# Patient Record
Sex: Female | Born: 1955
Health system: Southern US, Community
[De-identification: ages and names within clinical notes are randomized; demographics above are authoritative.]

## PROBLEM LIST (undated history)

## (undated) DIAGNOSIS — K219 Gastro-esophageal reflux disease without esophagitis: Secondary | ICD-10-CM

## (undated) DIAGNOSIS — K635 Polyp of colon: Secondary | ICD-10-CM

## (undated) DIAGNOSIS — K449 Diaphragmatic hernia without obstruction or gangrene: Secondary | ICD-10-CM

## (undated) DIAGNOSIS — G35 Multiple sclerosis: Secondary | ICD-10-CM

## (undated) DIAGNOSIS — R569 Unspecified convulsions: Secondary | ICD-10-CM

## (undated) DIAGNOSIS — I10 Essential (primary) hypertension: Secondary | ICD-10-CM

## (undated) DIAGNOSIS — M1990 Unspecified osteoarthritis, unspecified site: Secondary | ICD-10-CM

## (undated) HISTORY — PX: COLONOSCOPY: SHX174

## (undated) HISTORY — PX: DILATION AND CURETTAGE OF UTERUS: SHX78

## (undated) HISTORY — DX: Multiple sclerosis: G35

---

## 2000-01-22 ENCOUNTER — Encounter: Admission: RE | Admit: 2000-01-22 | Discharge: 2000-01-22 | Payer: Self-pay | Admitting: Family Medicine

## 2000-01-22 ENCOUNTER — Encounter: Payer: Self-pay | Admitting: Family Medicine

## 2001-02-05 ENCOUNTER — Encounter: Payer: Self-pay | Admitting: Obstetrics and Gynecology

## 2001-02-05 ENCOUNTER — Ambulatory Visit (HOSPITAL_COMMUNITY): Admission: RE | Admit: 2001-02-05 | Discharge: 2001-02-05 | Payer: Self-pay | Admitting: Obstetrics and Gynecology

## 2001-02-10 ENCOUNTER — Encounter: Payer: Self-pay | Admitting: Obstetrics and Gynecology

## 2001-02-10 ENCOUNTER — Encounter: Admission: RE | Admit: 2001-02-10 | Discharge: 2001-02-10 | Payer: Self-pay | Admitting: Obstetrics and Gynecology

## 2001-03-17 ENCOUNTER — Ambulatory Visit (HOSPITAL_COMMUNITY): Admission: RE | Admit: 2001-03-17 | Discharge: 2001-03-17 | Payer: Self-pay | Admitting: Family Medicine

## 2001-03-17 ENCOUNTER — Encounter: Payer: Self-pay | Admitting: Family Medicine

## 2002-02-15 ENCOUNTER — Encounter: Admission: RE | Admit: 2002-02-15 | Discharge: 2002-02-15 | Payer: Self-pay | Admitting: Obstetrics and Gynecology

## 2002-02-15 ENCOUNTER — Encounter: Payer: Self-pay | Admitting: Obstetrics and Gynecology

## 2003-02-15 ENCOUNTER — Encounter: Payer: Self-pay | Admitting: Family Medicine

## 2003-02-15 ENCOUNTER — Ambulatory Visit (HOSPITAL_COMMUNITY): Admission: RE | Admit: 2003-02-15 | Discharge: 2003-02-15 | Payer: Self-pay | Admitting: Family Medicine

## 2003-02-24 ENCOUNTER — Encounter: Admission: RE | Admit: 2003-02-24 | Discharge: 2003-02-24 | Payer: Self-pay | Admitting: Obstetrics and Gynecology

## 2003-02-24 ENCOUNTER — Encounter: Payer: Self-pay | Admitting: Obstetrics and Gynecology

## 2004-02-29 ENCOUNTER — Encounter: Admission: RE | Admit: 2004-02-29 | Discharge: 2004-02-29 | Payer: Self-pay | Admitting: Family Medicine

## 2004-03-07 ENCOUNTER — Ambulatory Visit (HOSPITAL_COMMUNITY): Admission: RE | Admit: 2004-03-07 | Discharge: 2004-03-07 | Payer: Self-pay | Admitting: Family Medicine

## 2005-01-04 ENCOUNTER — Emergency Department (HOSPITAL_COMMUNITY): Admission: EM | Admit: 2005-01-04 | Discharge: 2005-01-04 | Payer: Self-pay | Admitting: Emergency Medicine

## 2005-01-04 ENCOUNTER — Ambulatory Visit: Payer: Self-pay | Admitting: Internal Medicine

## 2005-01-04 ENCOUNTER — Inpatient Hospital Stay (HOSPITAL_COMMUNITY): Admission: EM | Admit: 2005-01-04 | Discharge: 2005-01-10 | Payer: Self-pay | Admitting: Emergency Medicine

## 2005-01-04 DIAGNOSIS — R569 Unspecified convulsions: Secondary | ICD-10-CM

## 2005-01-04 HISTORY — DX: Unspecified convulsions: R56.9

## 2005-04-29 ENCOUNTER — Ambulatory Visit (HOSPITAL_COMMUNITY): Admission: RE | Admit: 2005-04-29 | Discharge: 2005-04-29 | Payer: Self-pay | Admitting: Family Medicine

## 2005-05-19 ENCOUNTER — Ambulatory Visit (HOSPITAL_COMMUNITY): Admission: RE | Admit: 2005-05-19 | Discharge: 2005-05-19 | Payer: Self-pay | Admitting: Family Medicine

## 2005-07-08 ENCOUNTER — Ambulatory Visit (HOSPITAL_COMMUNITY): Admission: RE | Admit: 2005-07-08 | Discharge: 2005-07-08 | Payer: Self-pay | Admitting: Obstetrics & Gynecology

## 2005-07-28 ENCOUNTER — Ambulatory Visit (HOSPITAL_COMMUNITY): Admission: RE | Admit: 2005-07-28 | Discharge: 2005-07-28 | Payer: Self-pay | Admitting: Family Medicine

## 2005-10-07 ENCOUNTER — Encounter (HOSPITAL_COMMUNITY): Admission: RE | Admit: 2005-10-07 | Discharge: 2005-11-06 | Payer: Self-pay | Admitting: Family Medicine

## 2006-04-30 ENCOUNTER — Ambulatory Visit (HOSPITAL_COMMUNITY): Admission: RE | Admit: 2006-04-30 | Discharge: 2006-04-30 | Payer: Self-pay | Admitting: Family Medicine

## 2006-05-12 ENCOUNTER — Encounter: Admission: RE | Admit: 2006-05-12 | Discharge: 2006-05-12 | Payer: Self-pay | Admitting: Family Medicine

## 2006-05-27 ENCOUNTER — Ambulatory Visit (HOSPITAL_COMMUNITY): Admission: RE | Admit: 2006-05-27 | Discharge: 2006-05-27 | Payer: Self-pay | Admitting: Family Medicine

## 2006-10-28 ENCOUNTER — Ambulatory Visit (HOSPITAL_COMMUNITY): Admission: RE | Admit: 2006-10-28 | Discharge: 2006-10-28 | Payer: Self-pay | Admitting: Family Medicine

## 2006-11-11 ENCOUNTER — Ambulatory Visit (HOSPITAL_COMMUNITY): Admission: RE | Admit: 2006-11-11 | Discharge: 2006-11-11 | Payer: Self-pay | Admitting: Psychiatry

## 2007-05-06 ENCOUNTER — Ambulatory Visit (HOSPITAL_COMMUNITY): Admission: RE | Admit: 2007-05-06 | Discharge: 2007-05-06 | Payer: Self-pay | Admitting: Family Medicine

## 2007-05-12 ENCOUNTER — Ambulatory Visit (HOSPITAL_COMMUNITY): Admission: RE | Admit: 2007-05-12 | Discharge: 2007-05-12 | Payer: Self-pay | Admitting: Family Medicine

## 2007-10-21 ENCOUNTER — Other Ambulatory Visit: Admission: RE | Admit: 2007-10-21 | Discharge: 2007-10-21 | Payer: Self-pay | Admitting: Obstetrics and Gynecology

## 2008-05-12 ENCOUNTER — Ambulatory Visit (HOSPITAL_COMMUNITY): Admission: RE | Admit: 2008-05-12 | Discharge: 2008-05-12 | Payer: Self-pay | Admitting: Family Medicine

## 2008-07-05 ENCOUNTER — Ambulatory Visit (HOSPITAL_COMMUNITY): Admission: RE | Admit: 2008-07-05 | Discharge: 2008-07-05 | Payer: Self-pay | Admitting: Family Medicine

## 2008-10-31 ENCOUNTER — Other Ambulatory Visit: Admission: RE | Admit: 2008-10-31 | Discharge: 2008-10-31 | Payer: Self-pay | Admitting: Obstetrics & Gynecology

## 2009-05-16 ENCOUNTER — Ambulatory Visit (HOSPITAL_COMMUNITY): Admission: RE | Admit: 2009-05-16 | Discharge: 2009-05-16 | Payer: Self-pay | Admitting: Family Medicine

## 2009-07-19 ENCOUNTER — Ambulatory Visit (HOSPITAL_COMMUNITY): Admission: RE | Admit: 2009-07-19 | Discharge: 2009-07-19 | Payer: Self-pay | Admitting: Family Medicine

## 2009-07-27 ENCOUNTER — Ambulatory Visit (HOSPITAL_COMMUNITY): Admission: RE | Admit: 2009-07-27 | Discharge: 2009-07-27 | Payer: Self-pay | Admitting: Family Medicine

## 2009-08-07 ENCOUNTER — Ambulatory Visit (HOSPITAL_COMMUNITY): Admission: RE | Admit: 2009-08-07 | Discharge: 2009-08-07 | Payer: Self-pay | Admitting: Family Medicine

## 2010-02-06 ENCOUNTER — Ambulatory Visit (HOSPITAL_COMMUNITY): Admission: RE | Admit: 2010-02-06 | Discharge: 2010-02-06 | Payer: Self-pay | Admitting: Psychiatry

## 2010-05-22 ENCOUNTER — Ambulatory Visit (HOSPITAL_COMMUNITY)
Admission: RE | Admit: 2010-05-22 | Discharge: 2010-05-22 | Payer: Self-pay | Source: Home / Self Care | Admitting: Family Medicine

## 2010-05-28 ENCOUNTER — Ambulatory Visit (HOSPITAL_COMMUNITY)
Admission: RE | Admit: 2010-05-28 | Discharge: 2010-05-28 | Payer: Self-pay | Source: Home / Self Care | Admitting: Family Medicine

## 2010-07-14 ENCOUNTER — Encounter: Payer: Self-pay | Admitting: Family Medicine

## 2010-11-08 NOTE — Procedures (Signed)
NAMEMarland Kitchen  AHJA, MARTELLO NO.:  [id number]   MEDICAL RECORD NO.:  [id number]          PATIENT TYPE:  INP   LOCATION:  A217                          FACILITY:  [facility]   PHYSICIAN:  Pricilla Riffle, M.D.    DATE OF BIRTH:  1956-04-16   DATE OF PROCEDURE:  01/06/2005  DATE OF DISCHARGE:                                  ECHOCARDIOGRAM   REFERRING PHYSICIAN:  Corrie Mckusick, M.D.   INDICATIONS FOR TEST:  The patient is a 55 year old with a history of  paresthesias and asked to test and evaluate LV function.   RESULTS:  A 2D echocardiogram with echocardiogram Doppler showing left  ventricular normal in size with end-diastolic dimension of 41 mm.  Left  ventricular septum and posterior wall are normal at 10 mm and 8 mm each.   Left atrium is normal at 27 mm.  Right atrium and right ventricle are  normal.   The aortic valve is normal with no insufficiency.  Mitral valve is normal  with no insufficiency.  Pulmonic valve not well seen.  Tricuspid valve is  normal with no insufficiency.   IMPRESSION:  Overall, Left ventricular function is normal at approximately  60%.  Right ventricular function is normal.  No pericardial effusion is  seen.       PVR/MEDQ  D:  01/06/2005  T:  01/06/2005  Job:  [id number]

## 2010-11-08 NOTE — Op Note (Signed)
[name], [name] NO.:  [id number]   MEDICAL RECORD NO.:  [id number]          PATIENT TYPE:  INP   LOCATION:  A217                          FACILITY:  [facility]   PHYSICIAN:  Kofi A. Gerilyn Pilgrim, M.D. DATE OF BIRTH:  23-Jul-1955   DATE OF PROCEDURE:  01/07/2005  DATE OF DISCHARGE:                                  PROCEDURE NOTE   PROCEDURE:  Lumbar spinal tap.   REASON FOR PROCEDURE:  Multiple white matter lesions suspicious for multiple  sclerosis.  There is also an area in the gray matter of frontal lobe  suspicious for infectious process or stroke.   DESCRIPTION OF PROCEDURE:  Informed consent was obtained in the usual  fashion including the 15% chance of post LP headache.  The patient was  placed in the lying position and area was prepped and draped in the usual  fashion.  The L3, L4-L5 interspace and L4 interspace were attempted a few  times unsuccessfully.  She was placed in the sitting position and the L3-L4  space was attempted and access on the initial pass.  The fluid was initially  traumatic, but rapidly cleared.  The pressure was low and was not recorded.  The sample was sent for routine testing.  Additionally, a cryptococcal  antigen, RP and VDRL were obtained.  She tolerated the procedure well.      Kofi A. Gerilyn Pilgrim, M.D.  Electronically Signed     KAD/MEDQ  D:  01/07/2005  T:  01/07/2005  Job:  [id number]

## 2010-11-08 NOTE — Discharge Summary (Signed)
NAMEMADALAINE, [name] NO.:  [id number]   MEDICAL RECORD NO.:  [id number]          PATIENT TYPE:  INP   LOCATION:  A318                          FACILITY:  [facility]   PHYSICIAN:  Corrie Mckusick, M.D.  DATE OF BIRTH:  06-Dec-1955   DATE OF ADMISSION:  01/04/2005  DATE OF DISCHARGE:  07/21/2006LH                                 DISCHARGE SUMMARY   DISCHARGE DIAGNOSIS:  Multiple sclerosis.   HISTORY OF PRESENTING ILLNESS AND PAST MEDICAL HISTORY:  Please see  admission H&P.   HOSPITAL COURSE:  [age]-year-old female well known to me who presented  with a very unique presentation.  Please see admission H&P for details.  She  was admitted for the mental status changes and paresthesias in the right  upper extremity.  She had normalized her speech the day after admission and  we went ahead and covered her from a bacterial meningitis source as well as  a viral encephalitis with acyclovir.  Neurology was consulted and full  workup was obtained.   Basically after 24 hours of admission patient felt essentially back to  normal.  TSH and A1c was added as her blood sugar was up on admission which  were both normal.  Neurologically she remained intact.  She did have a  positive urine drug screen with cocaine which I think did bring on some of  these symptoms, however, on MRI she subsequently had the diagnosis of  multiple sclerosis.  Please see neurology's notes for details.  Echocardiogram was otherwise clear as well as carotid Doppler's.  He also  obtained a lumbar puncture which was negative.  Both antibiotics and  antivirals were stopped at that time.  CSF cultures were all negative.  Steroids were begun by neurology and continued to have improvement.  Then  again she had a normal echo as stated.   Patient was ready for discharge on January 10, 2005 and she was discharged on  Ambien at bedtime and no further steroids.  She is going to follow up with  Dr. Gerilyn Pilgrim in 2  weeks as well as set up an outpatient neurology evaluation  for a second opinion as well.  She was discharged by my partner as I was out  of town on the day of discharge.  Discharge condition improved and stable.  Please see progress note on day of discharge for physical exam and details.   DISCHARGE DIAGNOSIS:  Multiple sclerosis.       JCG/MEDQ  D:  02/01/2005  T:  02/01/2005  Job:  [id number]

## 2010-11-08 NOTE — Group Therapy Note (Signed)
NAMECESILY, CUOCO NO.:  [id number]   MEDICAL RECORD NO.:  [id number]          PATIENT TYPE:  INP   LOCATION:  A318                          FACILITY:  [facility]   PHYSICIAN:  Kofi A. Gerilyn Pilgrim, M.D. DATE OF BIRTH:  11-Jan-1956   DATE OF PROCEDURE:  01/09/2005  DATE OF DISCHARGE:                                   PROGRESS NOTE   HISTORY OF PRESENT ILLNESS:  The patient has had a significant improvement  in strength involving the right upper extremity.  She essentially is back to  baseline except with some small fine finger movements involving the hand.  Her strength is 4+ to 5-/5.  She has tolerated the Solu-Medrol well with  only mild flushing noted.   LABORATORY DATA:  Blood test results:  Sedimentation rate 1, hemoglobin A1c  5.2, angiotensin converting enzyme 32, homocystine level 0.9.  Total  cholesterol 171.  TSH 0.5.  B12 level 501.  RPR nonreactive.  VDRL was also  non-reactive.  oligoclonal bands are pending, however.  CSF also shows no  growth for two days.  Cryptococcal antigen negative.  ANA negative.   ASSESSMENT AND PLAN:  Acute onset of right monoparesis due to multiple  sclerosis.  She is to complete the course of Solu-Medrol tonight.  It is  probably okay for her to go home and follow up with [place] in a couple of weeks.  We will need to discuss with the patient which immunomodulator therapy she  needs to be on.  Given her high disease burden on the MRI, suggest she will  probably need a high-dose, high-frequency therapy.  The herpes PCR is not  back, but the clinical history does not seems consistent with encephalitis,  therefore, I think we can discontinued the acyclovir.      Kofi A. Gerilyn Pilgrim, M.D.  Electronically Signed     KAD/MEDQ  D:  01/09/2005  T:  01/09/2005  Job:  [id number]

## 2010-11-08 NOTE — Consult Note (Signed)
[name], [name] NO.:  [id number]   MEDICAL RECORD NO.:  [id number]          PATIENT TYPE:  INP   LOCATION:  A217                          FACILITY:  [facility]   PHYSICIAN:  Kofi A. Gerilyn Pilgrim, M.D. DATE OF BIRTH:  05-Dec-1955   DATE OF CONSULTATION:  01/06/2005  DATE OF DISCHARGE:                                   CONSULTATION   NEUROLOGICAL CONSULTATION   IMPRESSION:  Acute onset of right upper extremity monoparesis with MRI  showing multiple subcortical and periventricular white matter lesions.  The  constellation of clinical symptoms and MRI findings most consistent with  demyelinating process most likely from multiple sclerosis.  However, her MRI  does appear to have some gyral process related in the precentral gyrus on  the left side which seems to correlate with her clinical exam at this time.  She also appears to have similar findings on the right side.  The bilateral  findings suggest a possibility of cerebral venothrombosis although she does  not have any headaches making this diagnosis unlikely.  Other potential  diagnoses include typical nutritional deficiencies such as vitamin B12  deficiency.  Also consider vasculitides and sarcoidosis.  Given the lack of  fever and elevated white count, I do not believe she has bacterial  meningitis or acute viral encephalitis.   RECOMMENDATIONS:  I think spinal tap ought to be done.  This was discussed  at length with the patient.  We will proceed with that in the morning time.  Additional blood testing includes RPR, ANA, sed rate, angiotensin-converting  enzyme level, homocysteine level, vitamin B12 level.  Also suggest doing a  fasting lipid profile.  This may be a case where there are two processes  going on including acute stroke on top of a baseline extensive white matter  process.  She has echo and carotids done which are both unrevealing at this  time.  We did discuss smoking cessation with the patient.  I am  also going  to recommend an aspirin until complete evaluation and workup is done.   HISTORY:  This is a 55 year old right-handed Caucasian female who presented  with the relatively acute onset of right upper extremity weakness and  numbness.  She reports that on the initial presentation the right upper  extremity was totally limp, 0/5 strength, associated with numbness.  She was  seen in the emergency room and had a CT scan which is negative.  Because of  some reported neck pain, the patient was diagnosed as having degenerative  disk disease and given steroids.  Unfortunately, the patient had a spell  where she passed out and had a few myoclonic activities.  There is no  baseline history of seizures, however.  The patient's right upper extremity  weakness and numbness persisted and she presented to the emergency room for  further evaluation.  The patient was given prednisone and steroid after the  first  emergency room visit and it was thought she may have had a reaction  to this.  The patient reports having numbness in the left  upper extremity in  the past a few years ago which lasted 2 weeks.  She apparently has had this  several times.  No such symptoms are reported in the legs.   PAST MEDICAL HISTORY:  She has a history of Meniere's disease.   PAST SURGICAL HISTORY:  D&C 1997.   ADMISSION MEDICATIONS:  None.   ALLERGIES:  CODEINE.   SOCIAL HISTORY:  She smokes a pack of cigarettes per day.  She apparently  has moderate alcohol use.  No illicit drug use, although her urine drug  screen was positive for cocaine.  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  She is divorced.  She is a G1, P0.   FAMILY HISTORY:  Hypertension, dyslipidemia, and thyroid disease.   PHYSICAL EXAMINATION:  GENERAL:  Thin pleasant lady in no acute distress.  VITAL SIGNS:  She is afebrile since being hospitalized.  Current temperature  96.7.  Pulse 78, respirations 20, blood pressure  99/68.  HEENT EVALUATION:  Neck is supple; atraumatic, normocephalic.  EXTREMITIES:  No significant edema.  ABDOMEN:  Soft.  MENTATION:  Patient is awake, alert.  She does seem to have some mild memory  impairment as she cannot state the history precisely; history obtained from  significant other.  She, however, is lucid and coherent.  She did have some  previous confusional state earlier part of admission but she is pretty lucid  right now.  She follows commands bilaterally.  Speech is normal.  Language  and cognition seems unremarkable.  CRANIAL NERVE EVALUATION:  Pupils are 4 mm and briskly reactive.  Extraocular movements are intact.  Facial muscle strength is symmetric.  Tongue is midline.  Uvula is midline.  Shoulder shrug normal.  Motor  examination shows a clear right upper extremity pronator drift.  The  features are very classic.  She does have impaired fine finger movements.  She has weakness of the extensor muscles particularly the triceps about 3  relative to the flexor muscles which are about 4+ to 5.  Hand grip is about  4.  Other extremity shows normal tone, bulk, and strength.  Reflexes are  pathologically brisk with sustained clonus at the knees.  She has spread at  the biceps bilaterally.  Interestingly, plantar reflexes are both flexor.  Coordination does not show any dysmetria.  Gait is relatively normal.   BLOOD TESTING:  WBC 9, hemoglobin 14, platelet count of 206, MCV is high at  102.  Electrolytes are significant for glucose of 148 and previously 171,  BUN 40, creatinine 0.7, sodium 139, potassium 4.6, chloride 102, CO2 25.  Hemoglobin A1c 5.2.  TSH 0.85.  Urine drug screen is positive for  metabolites of cocaine and benzodiazepines.  MRI of the cervical spine was  reviewed, and there is a tiny lesion in the white matter on the left side at  approximately the lateral aspect of the posterior column on the left-hand side.  MRI of the cervical spine also reveals  multiple central herniated  disks mild in nature.  MRI of the brain shows numerous periventricular and  subcortical white matter lesions.  The orientation is perpendicular to the  ventricles and the corpus callosum which are classic Arita Miss Fingers seen in  multiple sclerosis.  There is area of concern, however, involving the gyral  area of approximately the precentral gyrus on the left side but also on the  right side, this shows hyperintensity in the flare sequence and also in the  diffusion imaging especially on the left side.   Thanks for this consultation.      Kofi A. Gerilyn Pilgrim, M.D.  Electronically Signed     KAD/MEDQ  D:  01/06/2005  T:  01/06/2005  Job:  [id number]

## 2010-11-08 NOTE — H&P (Signed)
NAMESKYLEE, [name] NO.:  [id number]   MEDICAL RECORD NO.:  [id number]          PATIENT TYPE:  INP   LOCATION:  A217                          FACILITY:  [facility]   PHYSICIAN:  Corrie Mckusick, M.D.  DATE OF BIRTH:  [birth year]   DATE OF ADMISSION:  01/04/2005  DATE OF DISCHARGE:  LH                                HISTORY & PHYSICAL   ADMITTING DIAGNOSIS:  Paresthesia.   ADMITTING CONDITION:  Guarded.   HISTORY OF PRESENTING ILLNESS:  This is a 55 year old female with no  significant past medical history other than Meniere's disease who presents  to the emergency department with right finger paresthesia.  She was seen  earlier on the day of admission by the physician's assistant in the ER with  second and third digit paresthesias on the right hand.  He did a head CT  which was negative and a neck CT which showed some degenerative disk disease  at C5-6 and C6-7 with narrowing at both of these levels.  It was felt that  this was due to possible disk disease and he placed her on prednisone as  well as giving her a Solu-Medrol injection.  Patient at that time was  otherwise neurologically intact and was sent home.   Once she got home, she had an event while sitting at her desk where her head  she reports went back and forward, not in a seizure-like activity but more  of a jerking activity and she fell to the ground and was able to pull the  phone down and dial 911.  Again, she was cognizant during this event and had  no other preceding symptoms.  She has during this entire time had no  headaches, no vision changes, at that time no speech changes and no prior  history of such an event.  Came into the emergency department again with the  right hand paresthesias and again was overall neurologically intact albeit  somewhat confused.  She did not appear postictal from the emergency  department physician's description.  At that time it was decided not to  repeat a head CT  as one had been done prior.  She had no injury to the head  when she had this event at home.   The emergency department physician felt like this could be a reaction to the  Depo-Medrol injection or could it be just a continuation of the prior event.  At that time we were concerned about a possible TIA.  She does not have  significant risk factors other than smoking.  She has no family history of  TIAs or CVAs and her cholesterol is remarkably fantastic.   It was decided to admit her for observation and close monitoring and obtain  blood work which was not done in the emergency department as well as further  workup for possible TIA.  The patient denied any travel outside of the area,  denied any ingestion of any new medications.  Denied any new vitamins or any  other new substances or any other drug use.  She also denied any headache,  any vision changes, any nausea, vomiting, or other symptomatology.  Cardiovascular, respiratory, GU and GI review of systems are all completely  negative.   PAST MEDICAL HISTORY:  Meniere's disease.   PAST SURGICAL HISTORY:  D&C in 1997.   MEDICATIONS ON ADMISSION:  None.   ALLERGIES:  CODEINE.   SOCIAL HISTORY:  Smokes a pack a day.  She has moderate alcohol use at three  to four beers three to four times a week.  No illicit drug use.  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  She is divorced.  She  is a G1, P0.   FAMILY HISTORY:  Family history significant for hypertension and  hyperlipidemia, some thyroid disease; no breast cancer, no colon cancer.   PHYSICAL EXAMINATION:  GENERAL:  When I saw the patient on the night of  admission, which was a few hours after the emergency department, her  temperature was 97.1, blood pressure 120/78, pulse 90, respirations 18, O2  saturation 98% on room air.  When I saw her, she was pleasant; she was  talkative but not quite making sense.  She did have some difficulty forming  her words  and was in a mild expressive aphasia and this seemed to be new  since coming up from the emergency department.  She did seem somewhat  pressured and slightly stressed.  HEENT:  Normocephalic, atraumatic.  Pupils equal and reactive to light.  Extraocular muscles are intact.  Nasopharynx is clear with moist mucous  membranes.  Neck supple; no lymphadenopathy, no thyromegaly, no JVD.  RESPIRATORY:  Chest clear to auscultation bilaterally.  CARDIOVASCULAR:  Regular rate and rhythm, normal S1-S2; no S3, S4, murmurs,  gallops, or rubs.  ABDOMEN:  Bowel sounds __________ , nontender, nondistended.  EXTREMITIES:  No cyanosis, clubbing, erythema; no edema.  NEUROLOGICAL:  Cranial nerves II-XII are intact.  Strength is 5/5 throughout  once I got her to cooperate.  An interesting neurological exam as she did  have some weakness in the right index finger but once I persuaded her to  continue to squeeze or continue to push it seemed to equal out the left  side.  She was slightly hyperreflexive throughout at 3+ but they were equal  bilaterally.  Sensation both pinprick and gross was equal throughout her  body otherwise neurologically unremarkable exam.   LABORATORIES:  There were no labs initially drawn; however, once I saw the  patient, I drew basic labs including a white count of 8, hemoglobin of 15.4,  hematocrit of 44.1, platelets of 219, sodium 134, potassium 3.9, chloride  105, bicarb 23, BUN 7, creatinine 0.8, glucose 171.  Liver function normal.   ASSESSMENT AND PLAN:  [age]-year-old female with a history of  Meniere's disease otherwise no significant medical history who presents with  paresthesia and now really mental status change.  When I admitted her, I  admitted her to concentrated care and set up for echocardiogram and carotid  Doppler's, MRI of both brain and C-spine, neurology consult, close  neurological checks q.1h.  Addendum:  After I saw the patient and left, her mental  status seemed to  change somewhat; the nurse called me saying that she was more confused and  not communicating like she had when she had first admitted her.  Due to  this, I decided to go ahead and get a repeat CT of the head which was  negative and I elected to add on a  serum drug screen as she was confused and  could not communicate with [place].  The serum drug screen did come back positive  for benzodiazepines which she is not currently taking and cocaine.  This, of  course, could be a partial cause of this mental status change and these  paresthesias.  At this point, I have not discussed this with the patient.  I  was overall concerned when I initially admitted her could this be a viral  encephalitis.  She did not have an elevated white count nor does she has  fever so I was not worried about bacterial meningitis as she had no  headache, no photophobia or other symptomatology.  I attempted to talk to  the neurologist on call at [facility] which I was unable to do so for an extended  period of time and I decided because of that to go ahead and cover her with  antibiotics for bacterial meningitis as well as antivirals in the event that  this could be herpes simplex virus encephalitis.  Obviously there is no  treatment for viral encephalitis unless it is HSV.  I started her on high  dose Rocephin as her renal function was okay as well as acyclovir.  Now that  this has come to __________ I am not sure that this is infectious at all.  Albeit I decided to go ahead and keep her on this and cover her.  A lumbar  puncture was going to be attempted later that night by Dr. Mosetta Putt from the  emergency department if she was able to do so.  However, this is of less  importance as I wanted to go ahead and get her on antibiotics in case this  was an infectious course.  I elected not to start her on heparin and only  started her on aspirin as once I got a better history was not convinced that  this was stroke  related or transient ischemic attacks.  She does have a  remarkably normal cholesterol panel as I did her physical back in September  with an LDL in the 30s and an HDL in the 70s and 80s.  We will see how she  progresses over the next 24 hours and hopefully this will clear and we will  also, of course, continue to get the MRI, echocardiogram, carotids and  neurological consult.       JCG/MEDQ  D:  01/05/2005  T:  01/05/2005  Job:  [id number]

## 2011-04-15 ENCOUNTER — Other Ambulatory Visit (HOSPITAL_COMMUNITY): Payer: Self-pay | Admitting: Psychiatry

## 2011-04-15 DIAGNOSIS — G35D Multiple sclerosis, unspecified: Secondary | ICD-10-CM

## 2011-04-15 DIAGNOSIS — G35 Multiple sclerosis: Secondary | ICD-10-CM

## 2011-04-23 ENCOUNTER — Other Ambulatory Visit (HOSPITAL_COMMUNITY): Payer: Self-pay | Admitting: Family Medicine

## 2011-04-23 DIAGNOSIS — Z139 Encounter for screening, unspecified: Secondary | ICD-10-CM

## 2011-04-24 ENCOUNTER — Ambulatory Visit (HOSPITAL_COMMUNITY)
Admission: RE | Admit: 2011-04-24 | Discharge: 2011-04-24 | Disposition: A | Discharge status: Home / Self Care | Payer: Medicare Other | Source: Ambulatory Visit | Attending: Psychiatry | Admitting: Psychiatry

## 2011-04-24 DIAGNOSIS — R209 Unspecified disturbances of skin sensation: Secondary | ICD-10-CM | POA: Insufficient documentation

## 2011-04-24 DIAGNOSIS — G35 Multiple sclerosis: Secondary | ICD-10-CM | POA: Insufficient documentation

## 2011-04-24 MED ORDER — GADOBENATE DIMEGLUMINE 529 MG/ML IV SOLN: 15.0000 mL | Freq: Once | INTRAVENOUS | Status: AC | PRN

## 2011-04-30 ENCOUNTER — Other Ambulatory Visit (HOSPITAL_COMMUNITY): Payer: Self-pay | Admitting: Psychiatry

## 2011-04-30 DIAGNOSIS — G35 Multiple sclerosis: Secondary | ICD-10-CM

## 2011-05-02 ENCOUNTER — Inpatient Hospital Stay (HOSPITAL_COMMUNITY): Admission: RE | Admit: 2011-05-02 | Payer: Medicare Other | Source: Ambulatory Visit

## 2011-05-02 ENCOUNTER — Ambulatory Visit (HOSPITAL_COMMUNITY)
Admission: RE | Admit: 2011-05-02 | Discharge: 2011-05-02 | Disposition: A | Discharge status: Home / Self Care | Payer: Medicare Other | Source: Ambulatory Visit | Attending: Psychiatry | Admitting: Psychiatry

## 2011-05-02 DIAGNOSIS — M5380 Other specified dorsopathies, site unspecified: Secondary | ICD-10-CM | POA: Insufficient documentation

## 2011-05-02 DIAGNOSIS — R209 Unspecified disturbances of skin sensation: Secondary | ICD-10-CM | POA: Insufficient documentation

## 2011-05-02 DIAGNOSIS — G35 Multiple sclerosis: Secondary | ICD-10-CM | POA: Insufficient documentation

## 2011-05-02 DIAGNOSIS — M5020 Other cervical disc displacement, unspecified cervical region: Secondary | ICD-10-CM | POA: Insufficient documentation

## 2011-05-02 MED ORDER — GADOBENATE DIMEGLUMINE 529 MG/ML IV SOLN: 15.0000 mL | Freq: Once | INTRAVENOUS | Status: AC | PRN

## 2011-05-05 ENCOUNTER — Ambulatory Visit (HOSPITAL_COMMUNITY): Payer: Medicare Other

## 2011-05-06 ENCOUNTER — Other Ambulatory Visit (HOSPITAL_COMMUNITY): Payer: Medicare Other

## 2011-06-03 ENCOUNTER — Ambulatory Visit (HOSPITAL_COMMUNITY)
Admission: RE | Admit: 2011-06-03 | Discharge: 2011-06-03 | Disposition: A | Discharge status: Home / Self Care | Payer: Medicare Other | Source: Ambulatory Visit | Attending: Family Medicine | Admitting: Family Medicine

## 2011-06-03 DIAGNOSIS — Z139 Encounter for screening, unspecified: Secondary | ICD-10-CM

## 2011-06-03 DIAGNOSIS — Z1231 Encounter for screening mammogram for malignant neoplasm of breast: Secondary | ICD-10-CM | POA: Insufficient documentation

## 2011-06-04 ENCOUNTER — Other Ambulatory Visit (HOSPITAL_COMMUNITY)
Admission: RE | Admit: 2011-06-04 | Discharge: 2011-06-04 | Disposition: A | Discharge status: Home / Self Care | Payer: Medicare Other | Source: Ambulatory Visit | Attending: Obstetrics and Gynecology | Admitting: Obstetrics and Gynecology

## 2011-06-04 ENCOUNTER — Other Ambulatory Visit: Payer: Self-pay | Admitting: Adult Health

## 2011-06-04 DIAGNOSIS — Z124 Encounter for screening for malignant neoplasm of cervix: Secondary | ICD-10-CM | POA: Insufficient documentation

## 2012-05-12 ENCOUNTER — Other Ambulatory Visit (HOSPITAL_COMMUNITY): Payer: Self-pay | Admitting: Psychiatry

## 2012-05-12 DIAGNOSIS — G35 Multiple sclerosis: Secondary | ICD-10-CM

## 2012-05-17 ENCOUNTER — Ambulatory Visit (HOSPITAL_COMMUNITY): Payer: Medicare Other

## 2012-05-24 ENCOUNTER — Encounter (INDEPENDENT_AMBULATORY_CARE_PROVIDER_SITE_OTHER): Payer: Self-pay | Admitting: *Deleted

## 2012-05-25 ENCOUNTER — Encounter (INDEPENDENT_AMBULATORY_CARE_PROVIDER_SITE_OTHER): Payer: Self-pay

## 2012-05-26 ENCOUNTER — Ambulatory Visit (HOSPITAL_COMMUNITY)
Admission: RE | Admit: 2012-05-26 | Discharge: 2012-05-26 | Disposition: A | Discharge status: Home / Self Care | Payer: Medicare Other | Source: Ambulatory Visit | Attending: Psychiatry | Admitting: Psychiatry

## 2012-05-26 DIAGNOSIS — G35 Multiple sclerosis: Secondary | ICD-10-CM | POA: Insufficient documentation

## 2012-05-26 MED ORDER — GADOBENATE DIMEGLUMINE 529 MG/ML IV SOLN
14.0000 mL | Freq: Once | INTRAVENOUS | Status: AC | PRN
  Administered 2012-05-26: 14 mL via INTRAVENOUS

## 2012-06-02 ENCOUNTER — Other Ambulatory Visit (HOSPITAL_COMMUNITY): Payer: Self-pay | Admitting: Family Medicine

## 2012-06-02 ENCOUNTER — Telehealth (INDEPENDENT_AMBULATORY_CARE_PROVIDER_SITE_OTHER): Payer: Self-pay | Admitting: *Deleted

## 2012-06-02 ENCOUNTER — Other Ambulatory Visit (INDEPENDENT_AMBULATORY_CARE_PROVIDER_SITE_OTHER): Payer: Self-pay | Admitting: *Deleted

## 2012-06-02 DIAGNOSIS — Z139 Encounter for screening, unspecified: Secondary | ICD-10-CM

## 2012-06-02 DIAGNOSIS — Z860100 Personal history of colon polyps, unspecified: Secondary | ICD-10-CM

## 2012-06-02 DIAGNOSIS — Z1211 Encounter for screening for malignant neoplasm of colon: Secondary | ICD-10-CM

## 2012-06-02 MED ORDER — PEG-KCL-NACL-NASULF-NA ASC-C 100 G PO SOLR: 1.0000 | Freq: Once | ORAL | Status: DC

## 2012-06-02 NOTE — Telephone Encounter (Signed)
Patient needs movi prep 

## 2012-06-10 ENCOUNTER — Ambulatory Visit (HOSPITAL_COMMUNITY): Payer: Medicare Other

## 2012-06-17 ENCOUNTER — Ambulatory Visit (HOSPITAL_COMMUNITY)
Admission: RE | Admit: 2012-06-17 | Discharge: 2012-06-17 | Disposition: A | Discharge status: Home / Self Care | Payer: Medicare Other | Source: Ambulatory Visit | Attending: Family Medicine | Admitting: Family Medicine

## 2012-06-17 DIAGNOSIS — Z139 Encounter for screening, unspecified: Secondary | ICD-10-CM

## 2012-06-17 DIAGNOSIS — Z1231 Encounter for screening mammogram for malignant neoplasm of breast: Secondary | ICD-10-CM | POA: Insufficient documentation

## 2012-06-29 ENCOUNTER — Other Ambulatory Visit: Payer: Self-pay | Admitting: Adult Health

## 2012-06-29 ENCOUNTER — Other Ambulatory Visit (HOSPITAL_COMMUNITY)
Admission: RE | Admit: 2012-06-29 | Discharge: 2012-06-29 | Disposition: A | Discharge status: Home / Self Care | Payer: Medicare Other | Source: Ambulatory Visit | Attending: Obstetrics and Gynecology | Admitting: Obstetrics and Gynecology

## 2012-06-29 DIAGNOSIS — Z1151 Encounter for screening for human papillomavirus (HPV): Secondary | ICD-10-CM | POA: Insufficient documentation

## 2012-06-29 DIAGNOSIS — Z01419 Encounter for gynecological examination (general) (routine) without abnormal findings: Secondary | ICD-10-CM | POA: Insufficient documentation

## 2012-07-01 ENCOUNTER — Telehealth (INDEPENDENT_AMBULATORY_CARE_PROVIDER_SITE_OTHER): Payer: Self-pay | Admitting: *Deleted

## 2012-07-01 NOTE — Telephone Encounter (Signed)
  Procedure: tcs  Reason/Indication:  Hx polyps  Has patient had this procedure before?  yes  If so, when, by whom and where?  2008 (scanned in EPIC)  Is there a family history of colon cancer?  no  Who?  What age when diagnosed?    Is patient diabetic?   no      Does patient have prosthetic heart valve?  no  Do you have a pacemaker?  no  Has patient had joint replacement within last 12 months?  no  Is patient on Coumadin, Plavix and/or Aspirin? no  Medications: copaxone 20 mg nightly injection for MS, super B complex nature made bid, vit d3 5000 mg daily, nexium 40 mg daily, probiotic daily, metamucil daily, valium 5 mg 1/2 tab prn  Allergies: codiene  Medication Adjustment:   Procedure date & time: 07/28/12 at 1030

## 2012-07-01 NOTE — Telephone Encounter (Signed)
agree

## 2012-07-08 ENCOUNTER — Other Ambulatory Visit: Payer: Self-pay | Admitting: Obstetrics & Gynecology

## 2012-07-13 ENCOUNTER — Encounter (HOSPITAL_COMMUNITY): Payer: Self-pay | Admitting: Pharmacy Technician

## 2012-08-18 ENCOUNTER — Telehealth (INDEPENDENT_AMBULATORY_CARE_PROVIDER_SITE_OTHER): Payer: Self-pay | Admitting: *Deleted

## 2012-08-18 NOTE — Telephone Encounter (Signed)
agree

## 2012-08-18 NOTE — Telephone Encounter (Signed)
  Procedure: tcs  Reason/Indication:  Hx polyps  Has patient had this procedure before?  Yes, 2008 (scanned in EPIC under procedure tab)  If so, when, by whom and where?    Is there a family history of colon cancer?  no  Who?  What age when diagnosed?    Is patient diabetic?   no      Does patient have prosthetic heart valve?  no  Do you have a pacemaker?  no  Has patient had joint replacement within last 12 months?  no  Is patient on Coumadin, Plavix and/or Aspirin? no  Medications: copaxone 20 mg nightly injection for MS, super B complex nature made bid, vit D3 5000 mg daily, nexium 40 mg daily, probiotic daily, metamucil daily, valium 5 mg 1/2 tab prn  Allergies: codiene  Medication Adjustment:   Procedure date & time: 09/16/12 at 930

## 2012-09-15 ENCOUNTER — Other Ambulatory Visit (INDEPENDENT_AMBULATORY_CARE_PROVIDER_SITE_OTHER): Payer: Self-pay | Admitting: *Deleted

## 2012-09-15 DIAGNOSIS — Z86010 Personal history of colonic polyps: Secondary | ICD-10-CM

## 2012-09-16 ENCOUNTER — Encounter (HOSPITAL_COMMUNITY): Payer: Self-pay | Admitting: *Deleted

## 2012-09-16 ENCOUNTER — Encounter (HOSPITAL_COMMUNITY)
Admission: RE | Disposition: A | Discharge status: Home / Self Care | Payer: Self-pay | Source: Ambulatory Visit | Attending: Internal Medicine

## 2012-09-16 ENCOUNTER — Ambulatory Visit (HOSPITAL_COMMUNITY)
Admission: RE | Admit: 2012-09-16 | Discharge: 2012-09-16 | Disposition: A | Discharge status: Home / Self Care | Payer: Medicare Other | Source: Ambulatory Visit | Attending: Internal Medicine | Admitting: Internal Medicine

## 2012-09-16 DIAGNOSIS — K5730 Diverticulosis of large intestine without perforation or abscess without bleeding: Secondary | ICD-10-CM | POA: Insufficient documentation

## 2012-09-16 DIAGNOSIS — Z860100 Personal history of colon polyps, unspecified: Secondary | ICD-10-CM | POA: Insufficient documentation

## 2012-09-16 DIAGNOSIS — K644 Residual hemorrhoidal skin tags: Secondary | ICD-10-CM

## 2012-09-16 DIAGNOSIS — Z09 Encounter for follow-up examination after completed treatment for conditions other than malignant neoplasm: Secondary | ICD-10-CM | POA: Insufficient documentation

## 2012-09-16 DIAGNOSIS — K6389 Other specified diseases of intestine: Secondary | ICD-10-CM

## 2012-09-16 HISTORY — PX: COLONOSCOPY: SHX5424

## 2012-09-16 HISTORY — DX: Polyp of colon: K63.5

## 2012-09-16 HISTORY — DX: Multiple sclerosis: G35

## 2012-09-16 HISTORY — DX: Diaphragmatic hernia without obstruction or gangrene: K44.9

## 2012-09-16 HISTORY — DX: Gastro-esophageal reflux disease without esophagitis: K21.9

## 2012-09-16 SURGERY — COLONOSCOPY
Anesthesia: Moderate Sedation

## 2012-09-16 MED ORDER — MEPERIDINE HCL 50 MG/ML IJ SOLN
INTRAMUSCULAR | Status: DC | PRN
  Administered 2012-09-16 (×4): 25 mg via INTRAVENOUS

## 2012-09-16 MED ORDER — MEPERIDINE HCL 50 MG/ML IJ SOLN
INTRAMUSCULAR | Status: AC
  Filled 2012-09-16: qty 1

## 2012-09-16 MED ORDER — MIDAZOLAM HCL 5 MG/5ML IJ SOLN
INTRAMUSCULAR | Status: AC
  Filled 2012-09-16: qty 5

## 2012-09-16 MED ORDER — SODIUM CHLORIDE 0.9 % IV SOLN
INTRAVENOUS | Status: DC
  Administered 2012-09-16: 09:00:00 via INTRAVENOUS

## 2012-09-16 MED ORDER — MIDAZOLAM HCL 5 MG/5ML IJ SOLN
INTRAMUSCULAR | Status: DC | PRN
  Administered 2012-09-16: 2 mg via INTRAVENOUS
  Administered 2012-09-16: 1 mg via INTRAVENOUS
  Administered 2012-09-16 (×2): 2 mg via INTRAVENOUS
  Administered 2012-09-16: 3 mg via INTRAVENOUS
  Administered 2012-09-16 (×2): 2 mg via INTRAVENOUS
  Administered 2012-09-16: 1 mg via INTRAVENOUS
  Administered 2012-09-16: 2 mg via INTRAVENOUS

## 2012-09-16 MED ORDER — MIDAZOLAM HCL 5 MG/5ML IJ SOLN
INTRAMUSCULAR | Status: AC
  Filled 2012-09-16: qty 10

## 2012-09-16 MED ORDER — STERILE WATER FOR IRRIGATION IR SOLN
Status: DC | PRN
  Administered 2012-09-16: 09:00:00

## 2012-09-16 NOTE — Op Note (Signed)
COLONOSCOPY PROCEDURE REPORT  PATIENT:  [name]  MR#:  [id number] Birthdate:  Sep 07, 1955, 57 y.o., female Endoscopist:  Dr. Malissa Hippo, MD Referred By:  Dr. Colette Ribas, MD Procedure Date: 09/16/2012  Procedure:   Colonoscopy  Indications:  Patient is 57 year old Caucasian female with history of colonic adenoma. She is here for surveillance colonoscopy. Her last exam was in December 2008 at [facility].  Informed Consent:  The procedure and risks were reviewed with the patient and informed consent was obtained.  Medications:  Demerol 100 mg IV Versed 17 mg IV  Description of procedure:  After a digital rectal exam was performed, that colonoscope was advanced from the anus through the rectum and colon to the area of the cecum, ileocecal valve and appendiceal orifice. The cecum was deeply intubated. These structures were well-seen and photographed for the record. From the level of the cecum and ileocecal valve, the scope was slowly and cautiously withdrawn. The mucosal surfaces were carefully surveyed utilizing scope tip to flexion to facilitate fold flattening as needed. The scope was pulled down into the rectum where a thorough exam including retroflexion was performed.  Findings:  Prep satisfactory. Redundant sigmoid colon and hepatic flexure. Multiple diverticula at sigmoid and descending colon with few motor involving proximal half of the colon. Normal rectal mucosa. Small hemorrhoids below the dentate line and two anal papillae.   Therapeutic/Diagnostic Maneuvers Performed:  See above/ none  Complications:  None  Cecal Withdrawal Time:  8 minutes  Impression:  Examination performed cecum. Pancolonic diverticulosis but most of the diverticula at sigmoid and descending colon. Small external hemorrhoids and two anal papillae. No evidence of recurrent polyps.  Recommendations:  Standard instructions given. High fiber diet. Next colonoscopy in 7 years.  REHMAN,NAJEEB  U  09/16/2012 10:22 AM  CC: Dr. Phillips Odor, Chancy Hurter, MD & Dr. Bonnetta Barry ref. provider found

## 2012-09-16 NOTE — H&P (Signed)
[name] is an 57 y.o. female.   Chief Complaint: Patient is here for colonoscopy. HPI: Patient is 57 year old Caucasian female who is here for surveillance colonoscopy. She had 8mm tubular adenoma removed over 5 years ago. She denies abdominal pain rectal bleeding or change in her bowel habits. Him history is negative for colorectal carcinoma.  Past Medical History  Diagnosis Date  . Multiple sclerosis   . Hiatal hernia   . GERD (gastroesophageal reflux disease)   . Colon polyps     Past Surgical History  Procedure Laterality Date  . Dilation and curettage of uterus    . Colonoscopy      Family History  Problem Relation Age of Onset  . Stomach cancer Father   . Colon cancer Neg Hx    Social History:  reports that she has been smoking Cigarettes.  She has a 20 pack-year smoking history. She does not have any smokeless tobacco history on file. She reports that she drinks about 4.8 ounces of alcohol per week. She reports that she does not use illicit drugs.  Allergies:  Allergies  Allergen Reactions  . Codeine Nausea And Vomiting    Medications Prior to Admission  Medication Sig Dispense Refill  . Cholecalciferol (VITAMIN D-3) 5000 UNITS TABS Take 1 tablet by mouth daily.      . Cyanocobalamin (VITAMIN B 12 PO) Take 1 tablet by mouth daily.      Marland Kitchen esomeprazole (NEXIUM) 40 MG capsule Take 40 mg by mouth daily before breakfast.      . glatiramer (COPAXONE) 20 MG/ML injection Inject 20 mg into the skin daily.      Marland Kitchen lactobacillus acidophilus (BACID) TABS Take 1 tablet by mouth daily.      . Multiple Vitamin (MULTIVITAMIN WITH MINERALS) TABS Take 1 tablet by mouth daily.      . peg 3350 powder (MOVIPREP) 100 G SOLR Take 1 kit (100 g total) by mouth once.  1 kit  0  . psyllium (METAMUCIL) 58.6 % powder Take 1 packet by mouth daily.      . diazepam (VALIUM) 5 MG tablet Take 2.5 mg by mouth at bedtime as needed. Sleep        No results found for this or any previous visit  (from the past 48 hour(s)). No results found.  ROS  Blood pressure 149/84, pulse 75, temperature 97.4 F (36.3 C), temperature source Oral, resp. rate 20, height 5\' 6"  (1.676 m), weight 145 lb (65.772 kg), SpO2 96.00%. Physical Exam  Constitutional: She appears well-developed and well-nourished.  HENT:  Mouth/Throat: Oropharynx is clear and moist.  Eyes: Conjunctivae are normal.  Neck: No thyromegaly present.  Cardiovascular: Normal rate, regular rhythm and normal heart sounds.   No murmur heard. Respiratory: Effort normal and breath sounds normal.  GI: She exhibits no distension and no mass.  Musculoskeletal: She exhibits no edema.  Lymphadenopathy:    She has no cervical adenopathy.  Neurological: She is alert.  Skin: Skin is warm and dry.     Assessment/Plan History of colonic adenoma. Surveillance colonoscopy.  REHMAN,NAJEEB U 09/16/2012, 9:23 AM

## 2012-09-22 ENCOUNTER — Encounter (HOSPITAL_COMMUNITY): Payer: Self-pay | Admitting: Internal Medicine

## 2013-04-28 ENCOUNTER — Other Ambulatory Visit: Payer: Self-pay

## 2013-08-23 ENCOUNTER — Ambulatory Visit (HOSPITAL_COMMUNITY)
Admission: RE | Admit: 2013-08-23 | Discharge: 2013-08-23 | Disposition: A | Discharge status: Home / Self Care | Payer: Medicare Other | Source: Ambulatory Visit | Attending: Family Medicine | Admitting: Family Medicine

## 2013-08-23 ENCOUNTER — Other Ambulatory Visit (HOSPITAL_COMMUNITY): Payer: Self-pay | Admitting: Family Medicine

## 2013-08-23 DIAGNOSIS — Z87891 Personal history of nicotine dependence: Secondary | ICD-10-CM

## 2013-08-23 DIAGNOSIS — Z0000 Encounter for general adult medical examination without abnormal findings: Secondary | ICD-10-CM

## 2013-08-23 DIAGNOSIS — J410 Simple chronic bronchitis: Secondary | ICD-10-CM

## 2013-08-23 DIAGNOSIS — F17200 Nicotine dependence, unspecified, uncomplicated: Secondary | ICD-10-CM | POA: Insufficient documentation

## 2013-08-24 ENCOUNTER — Other Ambulatory Visit (HOSPITAL_COMMUNITY): Payer: Self-pay | Admitting: Family Medicine

## 2013-08-24 DIAGNOSIS — Z139 Encounter for screening, unspecified: Secondary | ICD-10-CM

## 2013-08-30 ENCOUNTER — Ambulatory Visit (HOSPITAL_COMMUNITY): Payer: Medicare Other

## 2013-10-25 ENCOUNTER — Ambulatory Visit (HOSPITAL_COMMUNITY)
Admission: RE | Admit: 2013-10-25 | Discharge: 2013-10-25 | Disposition: A | Discharge status: Home / Self Care | Payer: Medicare Other | Source: Ambulatory Visit | Attending: Family Medicine | Admitting: Family Medicine

## 2013-10-25 DIAGNOSIS — Z139 Encounter for screening, unspecified: Secondary | ICD-10-CM

## 2013-10-25 DIAGNOSIS — Z1231 Encounter for screening mammogram for malignant neoplasm of breast: Secondary | ICD-10-CM | POA: Insufficient documentation

## 2013-12-09 ENCOUNTER — Ambulatory Visit (INDEPENDENT_AMBULATORY_CARE_PROVIDER_SITE_OTHER): Payer: Medicare Other | Admitting: Neurology

## 2013-12-09 ENCOUNTER — Encounter: Payer: Self-pay | Admitting: *Deleted

## 2013-12-09 VITALS — BP 125/93 | HR 71 | Resp 16 | Ht 66.0 in | Wt 151.0 lb

## 2013-12-09 DIAGNOSIS — G35 Multiple sclerosis: Secondary | ICD-10-CM

## 2013-12-09 HISTORY — DX: Multiple sclerosis: G35

## 2013-12-09 MED ORDER — GLATIRAMER ACETATE 40 MG/ML ~~LOC~~ SOSY: 40.0000 mg | PREFILLED_SYRINGE | SUBCUTANEOUS | Status: DC

## 2013-12-09 NOTE — Progress Notes (Addendum)
[facility]  Provider:  Larey Seat, M D  Referring Provider: Caren Macadam, MD Primary Care Physician:  Rocky Morel, MD  Chief Complaint  Patient presents with  . New Evaluation    room 10  . Multiple Sclerosis    HPI:  [name] is a 58 y.o. female , who  is seen here as a referral  from Dr. Micheline Rough , MD for a transfer of care for multiple sclerosis.   [name]  reports that she received the diagnosis of multiple sclerosis at age 55.  She was diagnosed by Dr. Arsenio Katz, after years of having symptoms that included skin dysesthesias , numbness and right eye vision loss or blurring of vision.  Soon afterwards she was seen by Dr. Dellis Filbert ,a multiple sclerosis specialist and followed him from the [facility] office to the [facility] office. She is now looking for followup care , also within the network of her other physicians.  Original diagnosis was established by clinical history, abnormal brain MRI and a spinal tap positive for oligoclonal bands.  A copy of one of her last MRI is is available to me. Her brain MRI with and without contrast was compared to a study from 2012 and documented only  minimal  Disease progression. The patient had mild ventricular enlargement unchanged for the last 3 years. Multiple areas of increased white matter signal in the periventricular space, significant involvement of the corpus callosum and classic Dawson's fingers. Involvement of white matter demyelination in the temporal contractions.  Dr. Starleen Blue interpreted the study as representing  A moderate to severe disease burden is reference to her long-standing multiple sclerosis.   The patient's most recent blood test results were also attached ( referral papers)- she has a normal white blood cell count she's not anemic, there is  normal kidney function ,normal liver function and  she is not diabetic. She's currently controlled on Copaxone  with 40 MG 3 TIMES A  WEEK THE PATIENT SWITCHED LAST SUMMER FROM daily to the 3 Times Weekly Formulation.  She has Relapsing Remitting Multiple Sclerosis , she has noticed neither  clinical changes nor side effects since switching in the formulation.   Review of Systems: Out of a complete 14 system review, the patient complains of only the following symptoms, and all other reviewed systems are negative.   History   Social History  . Marital Status: Single    Spouse Name: N/A    Number of Children: 0  . Years of Education: College   Occupational History  .     Social History Main Topics  . Smoking status: Current Every Day Smoker -- 1.00 packs/day for 20 years    Types: Cigarettes  . Smokeless tobacco: Never Used  . Alcohol Use: 4.8 oz/week    8 Cans of beer per week  . Drug Use: No  . Sexual Activity: Not on file   Other Topics Concern  . Not on file   Social History Narrative   Patient is single and lives alone.   Patient has a college education   Patient is right-handed.   Patient is retired.   Patient drinks three cups of coffee daily.    Family History  Problem Relation Age of Onset  . Stomach cancer Father   . Colon cancer Neg Hx   . Sick sinus syndrome Father     Past Medical History  Diagnosis Date  . Multiple sclerosis   .  Hiatal hernia   . GERD (gastroesophageal reflux disease)   . Colon polyps   . MS (multiple sclerosis) 12/09/2013    Past Surgical History  Procedure Laterality Date  . Dilation and curettage of uterus    . Colonoscopy    . Colonoscopy N/A 09/16/2012    Procedure: COLONOSCOPY;  Surgeon: Rogene Houston, MD;  Location: [facility];  Service: Endoscopy;  Laterality: N/A;  1030-rescheduled to 9:30 Ann notified pt    Current Outpatient Prescriptions  Medication Sig Dispense Refill  . Cholecalciferol (VITAMIN D3) 3000 UNITS TABS Take 1 tablet by mouth daily.      . Cyanocobalamin (VITAMIN B 12 PO) Take 1 tablet by mouth daily.      . diazepam (VALIUM)  5 MG tablet Take 2.5 mg by mouth at bedtime as needed. Sleep      . esomeprazole (NEXIUM) 40 MG capsule Take 40 mg by mouth daily before breakfast.      . Glatiramer Acetate (COPAXONE) 40 MG/ML SOSY Inject 40 mg into the skin. 3 times a week      . lactobacillus acidophilus (BACID) TABS Take 1 tablet by mouth daily.      . Multiple Vitamin (MULTIVITAMIN WITH MINERALS) TABS Take 1 tablet by mouth daily.      . psyllium (METAMUCIL) 58.6 % powder Take 1 packet by mouth daily.      Marland Kitchen VIIBRYD 10 MG TABS Taking 1/2 tablet daily.       No current facility-administered medications for this visit.    Allergies as of 12/09/2013 - Review Complete 12/09/2013  Allergen Reaction Noted  . Codeine Nausea And Vomiting 07/13/2012    Vitals: BP 125/93  Pulse 71  Resp 16  Ht 5\' 6"  (1.676 m)  Wt 151 lb (68.493 kg)  BMI 24.38 kg/m2 Last Weight:  Wt Readings from Last 1 Encounters:  12/09/13 151 lb (68.493 kg)   Last Height:   Ht Readings from Last 1 Encounters:  12/09/13 5\' 6"  (1.676 m)    Physical exam:  General: The patient is awake, alert and appears  in acute distress from heat and humidity. The patient is well groomed. Head: Normocephalic, atraumatic. Neck is supple. Mallampati 2 , neck circumference: 13.7, no TMJ pain, no neck tenderness.  Cardiovascular:  Regular rate and rhythm , without  murmurs or carotid bruit, and without distended neck veins. Respiratory: Lungs are clear to auscultation. Skin:  Without evidence of edema, or rash Trunk: BMI is normal.   Neurologic exam : The patient is awake and alert, oriented to place and time.  Memory subjective described as intact.  There is a normal attention span & concentration ability. Speech is fluent without  dysarthria, dysphonia or aphasia. Mood and affect are appropriate.  Cranial nerves: Pupils are equal and briskly reactive to light. Funduscopic exam without  evidence of pallor or edema.  Extraocular movements  in vertical and  horizontal planes intact and without nystagmus. Visual fields by finger perimetry are intact. Hearing to finger rub intact.  Facial sensation intact to fine touch.   Facial motor strength is symmetric and tongue and uvula move midline.  Motor exam: Normal tone , muscle bulk and symmetric strength in the upper extremities. She has a weakness to knee extension,  Left over right and weakness of dorsi-flexion on the right.   Sensory:  Fine touch, pinprick and vibration were tested in all extremities. Proprioception is normal.  Coordination: Rapid alternating movements in the fingers/hands is tested and  normal.  Finger-to-nose maneuver tested -evidence of ataxia, dysmetria.  On the right, no pronator drift , very mild bilateral tremor.  Gait and station: Patient walks without assistive device.  Ataxia on tandem gait, heel stand and toe stand were preformed,  Romberg negative. Strength within normal limits. Stance is stable and normal.     Deep tendon reflexes: in the  upper and lower extremities are symmetric and  Very brisk-but  intact. Babinski maneuver response is downgoing !.   Assessment:  After physical and neurologic examination, review of laboratory studies, imaging, neurophysiology testing and pre-existing records, assessment is   1) Relapsing- remitting MS , on Copaxone. Needs refill for 6 month.   2) very mild symptoms.   Plan:  Treatment plan and additional workup : refills. Order older MRI copy.

## 2014-06-13 ENCOUNTER — Ambulatory Visit: Payer: Medicare Other | Admitting: Neurology

## 2014-06-20 ENCOUNTER — Encounter: Payer: Self-pay | Admitting: Neurology

## 2014-06-20 ENCOUNTER — Ambulatory Visit (INDEPENDENT_AMBULATORY_CARE_PROVIDER_SITE_OTHER): Payer: Medicare Other | Admitting: Neurology

## 2014-06-20 VITALS — BP 168/98 | HR 88 | Resp 18 | Ht 66.0 in | Wt 150.0 lb

## 2014-06-20 DIAGNOSIS — G4700 Insomnia, unspecified: Secondary | ICD-10-CM

## 2014-06-20 DIAGNOSIS — G35 Multiple sclerosis: Secondary | ICD-10-CM

## 2014-06-20 DIAGNOSIS — K2100 Gastro-esophageal reflux disease with esophagitis, without bleeding: Secondary | ICD-10-CM

## 2014-06-20 MED ORDER — GLATIRAMER ACETATE 40 MG/ML ~~LOC~~ SOSY: 40.0000 mg | PREFILLED_SYRINGE | SUBCUTANEOUS | Status: DC

## 2014-06-20 MED ORDER — VILAZODONE HCL 10 MG PO TABS: 10.0000 mg | ORAL_TABLET | Freq: Every day | ORAL | Status: DC

## 2014-06-20 MED ORDER — ESOMEPRAZOLE MAGNESIUM 40 MG PO CPDR: DELAYED_RELEASE_CAPSULE | ORAL | Status: DC

## 2014-06-20 NOTE — Progress Notes (Signed)
[facility]  Provider:  Larey Seat, M D  Referring Provider: Caren Macadam, MD Primary Care Physician:  Rocky Morel, MD  Chief Complaint  Patient presents with  . RV MS    Rm 10, alone    HPI:  [name] is a 58 y.o. female , who  is seen here as a referral from Dr. Micheline Rough , MD and Dr. Hilma Favors for a transfer of care for multiple sclerosis.   [name] reports that she received the diagnosis of multiple sclerosis at age 68.  She was diagnosed by Dr. Arsenio Katz, after years of having symptoms that included skin dysesthesias , numbness and right eye vision loss or blurring of vision- Soon afterwards she was seen by Dr. Dellis Filbert ,a multiple sclerosis specialist and followed him from the [facility] office to the [facility] office. She is now looking for followup care , also within the network of her other physicians.  Original diagnosis was established by clinical history, abnormal brain MRI and a spinal tap positive for oligoclonal bands.  A copy of one of her last MRI is is available to me. Her brain MRI with and without contrast was compared to a study from 2012 and documented only  minimal  Disease progression. The patient had mild ventricular enlargement unchanged for the last 3 years. Multiple areas of increased white matter signal in the periventricular space, significant involvement of the corpus callosum and classic Dawson's fingers. Involvement of white matter demyelination in the temporal area. Dr. Starleen Blue interpreted the study as  moderate to severe disease burden , referenced  to her long-standing multiple sclerosis. The patient's most recent blood test results were also attached ( referral papers)- she has a normal white blood cell count she's not anemic, there is  normal kidney function ,normal liver function and  she is not diabetic. She's currently controlled on Copaxone  with 40 MG 3 TIMES A WEEK THE PATIENT SWITCHED LAST SUMMER FROM  daily to the 3 Times Weekly Formulation.  She has Relapsing Remitting Multiple Sclerosis , she has noticed neither  clinical changes nor side effects since switching in the formulation.   06-20-14   [name] has been stable neurologically and overall since our last visit. She has changed to the 3 times weekly form of Copaxone without any problems. She already received her pneumonia shot and her flu shot for this year. She resides in [place] and the community has been suffering under a viral infection wave this winter with the respiratory with respiratory symptoms as well as myalgia. She has so far done very well. She has no recent blood test to review. She moved to a one level apartment , first floor and is happy with the new neighborhood. She lives alone with a cat.    Review of Systems: Out of a complete 14 system review, the patient complains of only the following symptoms, and all other reviewed systems are negative.  Patient feels hot an humid.    History   Social History  . Marital Status: Single    Spouse Name: N/A    Number of Children: 0  . Years of Education: College   Occupational History  .     Social History Main Topics  . Smoking status: Current Every Day Smoker -- 1.00 packs/day for 20 years    Types: Cigarettes  . Smokeless tobacco: Never Used  . Alcohol Use: 4.8 oz/week    8 Cans of beer per  week  . Drug Use: No  . Sexual Activity: Not on file   Other Topics Concern  . Not on file   Social History Narrative   Patient is single and lives alone.   Patient has a college education   Patient is right-handed.   Patient is retired.   Patient drinks three cups of coffee daily.    Family History  Problem Relation Age of Onset  . Stomach cancer Father   . Colon cancer Neg Hx   . Sick sinus syndrome Father     Past Medical History  Diagnosis Date  . Multiple sclerosis   . Hiatal hernia   . GERD (gastroesophageal reflux disease)   . Colon polyps   .  MS (multiple sclerosis) 12/09/2013    Past Surgical History  Procedure Laterality Date  . Dilation and curettage of uterus    . Colonoscopy    . Colonoscopy N/A 09/16/2012    Procedure: COLONOSCOPY;  Surgeon: Rogene Houston, MD;  Location: [facility];  Service: Endoscopy;  Laterality: N/A;  1030-rescheduled to 9:30 Ann notified pt    Current Outpatient Prescriptions  Medication Sig Dispense Refill  . Cholecalciferol (VITAMIN D3) 3000 UNITS TABS Take 1 tablet by mouth daily.    . Cyanocobalamin (VITAMIN B 12 PO) Take 1 tablet by mouth daily.    Marland Kitchen esomeprazole (NEXIUM) 40 MG capsule Take 40 mg by mouth daily before breakfast.    . Glatiramer Acetate (COPAXONE) 40 MG/ML SOSY Inject 40 mg into the skin as directed. 3 times a week 36 Syringe 3  . lactobacillus acidophilus (BACID) TABS Take 1 tablet by mouth daily.    . Multiple Vitamin (MULTIVITAMIN WITH MINERALS) TABS Take 1 tablet by mouth daily.    Marland Kitchen VIIBRYD 10 MG TABS Taking 1/2 tablet daily.    . diazepam (VALIUM) 5 MG tablet Take 2.5 mg by mouth at bedtime as needed. Sleep    . psyllium (METAMUCIL) 58.6 % powder Take 1 packet by mouth daily.     No current facility-administered medications for this visit.    Allergies as of 06/20/2014 - Review Complete 06/20/2014  Allergen Reaction Noted  . Codeine Nausea And Vomiting 07/13/2012    Vitals: BP 168/98 mmHg  Pulse 88  Resp 18  Ht 5\' 6"  (1.676 m)  Wt 150 lb (68.04 kg)  BMI 24.22 kg/m2 Last Weight:  Wt Readings from Last 1 Encounters:  06/20/14 150 lb (68.04 kg)   Last Height:   Ht Readings from Last 1 Encounters:  06/20/14 5\' 6"  (1.676 m)    Physical exam:  General: The patient is awake, alert and appears  in acute distress from heat and humidity. The patient is well groomed. Head: Normocephalic, atraumatic. Neck is supple. Mallampati 2 , neck circumference: 13.7, no TMJ pain, no neck tenderness.  Cardiovascular:  Regular rate and rhythm , without  murmurs or carotid  bruit, and without distended neck veins. Respiratory: Lungs are clear to auscultation. Skin:  Without evidence of edema, or rash Trunk: BMI is normal.   Neurologic exam : The patient is awake and alert, oriented to place and time.  Memory subjective described as intact.  There is a normal attention span & concentration ability. Speech is fluent without dysarthria, dysphonia or aphasia. Mood and affect are appropriate.  Cranial nerves: Pupils are equal and briskly reactive to light. Funduscopic exam without  evidence of pallor or edema.  Extraocular movements  in vertical and horizontal planes intact and without  nystagmus. Visual fields by finger perimetry are intact. Hearing to finger rub intact.  Facial sensation intact to fine touch.  Facial motor strength is symmetric and tongue and uvula move midline. Motor exam: Normal tone , muscle bulk and symmetric strength in the upper extremities. She has a weakness to knee extension,  Left over right and weakness of dorsi-flexion on the right.  Sensory:  Fine touch, pinprick and vibration were tested in all extremities. Proprioception is normal.  Coordination: Rapid alternating movements in the fingers/hands is tested and normal.  Finger-to-nose maneuver tested -evidence of ataxia, dysmetria.   On the right: no pronator drift , very mild bilateral tremor.  Gait and station: Patient walks without assistive device.  Ataxia on tandem gait, heel stand and toe stand were preformed,  Romberg negative. Strength within normal limits. Stance is stable and normal.     Deep tendon reflexes: in the  upper and lower extremities are symmetric and very brisk-but not clonus.  Babinski maneuver response is downgoing    Assessment:  After physical and neurologic examination, review of laboratory studies, imaging, neurophysiology testing and pre-existing records, assessment is   1) Relapsing- remitting MS , on Copaxone. Needs refill for 6 month.  very mild  symptoms of MS. Patient had her flu shot and her pneumonia vaccine.   Plan:  Treatment plan and additional workup :   Medication refills not needed.  Patient access network needs to sign up.

## 2014-06-20 NOTE — Patient Instructions (Signed)
Glatiramer injection What is this medicine? GLATIRAMER (gla TIR a mer) helps to decrease the number of multiple sclerosis relapses in people with relapsing-remitting forms of the disease. The medicine does not cure multiple sclerosis. This medicine may be used for other purposes; ask your health care provider or pharmacist if you have questions. COMMON BRAND NAME(S): Copaxone, Copaxone Patient Pack What should I tell my health care provider before I take this medicine? They need to know if you have any of these conditions: -immune system problems -infection -an unusual or allergic reaction to glatiramer, mannitol, other medicines, foods, dyes, or preservatives -pregnant or trying to get pregnant -breast-feeding How should I use this medicine? This medicine is for injection under the skin. You will be taught how to prepare and give this medicine. Use exactly as directed. Take your medicine at regular intervals. Do not take your medicine more often than directed. Do not stop taking except on your doctor's advice. It is important that you put your used needles and syringes in a special sharps container. Do not put them in a trash can. If you do not have a sharps container, call your pharmacist or healthcare provider to get one. Talk to your pediatrician regarding the use of this medicine in children. Special care may be needed. Overdosage: If you think you have taken too much of this medicine contact a poison control center or emergency room at once. NOTE: This medicine is only for you. Do not share this medicine with others. What if I miss a dose? If you miss a dose, take it as soon as you can. If it is almost time for your next dose, take only that dose. Do not take double or extra doses. What may interact with this medicine? Interactions are not expected. This list may not describe all possible interactions. Give your health care provider a list of all the medicines, herbs, non-prescription  drugs, or dietary supplements you use. Also tell them if you smoke, drink alcohol, or use illegal drugs. Some items may interact with your medicine. What should I watch for while using this medicine? Visit your doctor or health care professional for regular checks on your progress. What side effects may I notice from receiving this medicine? Side effects that you should report to your doctor or health care professional as soon as possible: -allergic reactions like skin rash, itching or hives, swelling of the face, lips, or tongue -chest pain or tightness -difficulty breathing -fever, chills, or any other sign of infection -rapid heartbeat or palpitations -severe pain at the injection site -swelling of the ankles Side effects that usually do not require medical attention (report to your doctor or health care professional if they continue or are bothersome): -anxiety -dizziness -drowsiness -flushing -joint aches -nausea, vomiting -pain, redness, itching, or irritation at the injection site -tremor -weakness This list may not describe all possible side effects. Call your doctor for medical advice about side effects. You may report side effects to FDA at 1-800-FDA-1088. Where should I keep my medicine? Keep out of the reach of children. Store in a refrigerator between 2 and 8 degrees C (36 and 46 degrees F). An unused prefilled syringe may be stored for up to 7 days between 15 and 30 degrees C (59 and 86 degrees F). Do not freeze. Protect from light. Throw away any unused diluted injection. Throw away any unused medicine after the expiration date. NOTE: This sheet is a summary. It may not cover all possible information. If you  have questions about this medicine, talk to your doctor, pharmacist, or health care provider.  2015, Elsevier/Gold Standard. (2008-05-23 10:21:43)

## 2014-06-20 NOTE — Addendum Note (Signed)
Addended by: Larey Seat on: 06/20/2014 03:48 PM   Modules accepted: Orders

## 2014-06-21 LAB — COMPREHENSIVE METABOLIC PANEL
A/G RATIO: 2 (ref 1.1–2.5)
ALK PHOS: 84 IU/L (ref 39–117)
ALT: 15 IU/L (ref 0–32)
AST: 23 IU/L (ref 0–40)
Albumin: 4.5 g/dL (ref 3.5–5.5)
BILIRUBIN TOTAL: 0.7 mg/dL (ref 0.0–1.2)
BUN/Creatinine Ratio: 17 (ref 9–23)
BUN: 12 mg/dL (ref 6–24)
CHLORIDE: 98 mmol/L (ref 97–108)
CO2: 24 mmol/L (ref 18–29)
Calcium: 9.9 mg/dL (ref 8.7–10.2)
Creatinine, Ser: 0.71 mg/dL (ref 0.57–1.00)
GFR calc non Af Amer: 95 mL/min/{1.73_m2} (ref >59)
GFR, EST AFRICAN AMERICAN: 109 mL/min/{1.73_m2} (ref >59)
GLUCOSE: 99 mg/dL (ref 65–99)
Globulin, Total: 2.3 g/dL (ref 1.5–4.5)
POTASSIUM: 4 mmol/L (ref 3.5–5.2)
SODIUM: 138 mmol/L (ref 134–144)
TOTAL PROTEIN: 6.8 g/dL (ref 6.0–8.5)

## 2014-06-28 ENCOUNTER — Telehealth: Payer: Self-pay | Admitting: Neurology

## 2014-06-28 NOTE — Telephone Encounter (Signed)
Patient stated PCP has prescribed her LORSARTAN 50 mg due to elevated blood pressure.  Also stated Cortisone levels are elevated and questioning if she should proceed with MRI scheduled 07/05/14.  Please call and advise.

## 2014-06-30 NOTE — Telephone Encounter (Signed)
I called pt and labs results normal given.  She is having increased cortisol level and is wondering about if MRI checks pituitary.  (Rec on Endocrinologist in [place]).  Would ask Dr. [name].

## 2014-06-30 NOTE — Telephone Encounter (Signed)
Patient is calling again, would like a phone call back.

## 2014-06-30 NOTE — Telephone Encounter (Signed)
Yes, this MRI can be ordered to look at the pituitary gland specifically to rule out adenoma. CD Message to  Michael Litter to add intruction to radiology. Thank You.

## 2014-07-03 ENCOUNTER — Encounter: Payer: Self-pay | Admitting: Neurology

## 2014-07-03 NOTE — Progress Notes (Signed)
Quick Note:  I called pt and relayed normal lab result. She verbalized understanding. ______

## 2014-07-03 NOTE — Telephone Encounter (Signed)
I called and spoke to [facility] in [place] imaging and asked about the MRI brain and pituitary and she relayed to note the referral with attention pituitary and history of MS.   Done.  I spoke to pt and let her know.  She still does not have endo referral appt. from pcp as yet.

## 2014-07-05 ENCOUNTER — Ambulatory Visit
Admission: RE | Admit: 2014-07-05 | Discharge: 2014-07-05 | Disposition: A | Discharge status: Home / Self Care | Payer: PRIVATE HEALTH INSURANCE | Source: Ambulatory Visit | Attending: Neurology | Admitting: Neurology

## 2014-07-05 DIAGNOSIS — G35 Multiple sclerosis: Secondary | ICD-10-CM

## 2014-07-05 MED ORDER — GADOBENATE DIMEGLUMINE 529 MG/ML IV SOLN
7.0000 mL | Freq: Once | INTRAVENOUS | Status: AC | PRN
  Administered 2014-07-05: 7 mL via INTRAVENOUS

## 2014-07-07 ENCOUNTER — Telehealth: Payer: Self-pay | Admitting: *Deleted

## 2014-07-07 NOTE — Telephone Encounter (Signed)
[name] called wanting to know about her MRI results.

## 2014-07-10 NOTE — Telephone Encounter (Signed)
Calling about MRI results.   Printed and placed on desk to review.

## 2014-07-11 NOTE — Telephone Encounter (Signed)
Pt called again and I spoke to her and gave her the results of the MRI brain and C spine.  Pt verbalized understanding.

## 2014-07-11 NOTE — Telephone Encounter (Signed)
See email message 07-03-14.  I gave her results.

## 2014-11-08 ENCOUNTER — Other Ambulatory Visit (HOSPITAL_COMMUNITY): Payer: Self-pay | Admitting: Family Medicine

## 2014-11-08 DIAGNOSIS — Z1231 Encounter for screening mammogram for malignant neoplasm of breast: Secondary | ICD-10-CM

## 2014-12-21 ENCOUNTER — Ambulatory Visit (HOSPITAL_COMMUNITY)
Admission: RE | Admit: 2014-12-21 | Discharge: 2014-12-21 | Disposition: A | Discharge status: Home / Self Care | Payer: Medicare Other | Source: Ambulatory Visit | Attending: Family Medicine | Admitting: Family Medicine

## 2014-12-21 DIAGNOSIS — Z1231 Encounter for screening mammogram for malignant neoplasm of breast: Secondary | ICD-10-CM | POA: Insufficient documentation

## 2015-01-03 ENCOUNTER — Ambulatory Visit (INDEPENDENT_AMBULATORY_CARE_PROVIDER_SITE_OTHER): Payer: 59 | Admitting: Neurology

## 2015-01-03 ENCOUNTER — Encounter: Payer: Self-pay | Admitting: Neurology

## 2015-01-03 VITALS — BP 122/80 | HR 76 | Resp 20 | Ht 66.0 in | Wt 151.0 lb

## 2015-01-03 DIAGNOSIS — K2100 Gastro-esophageal reflux disease with esophagitis, without bleeding: Secondary | ICD-10-CM

## 2015-01-03 DIAGNOSIS — G35 Multiple sclerosis: Secondary | ICD-10-CM | POA: Diagnosis not present

## 2015-01-03 DIAGNOSIS — G4700 Insomnia, unspecified: Secondary | ICD-10-CM

## 2015-01-03 MED ORDER — GLATIRAMER ACETATE 40 MG/ML ~~LOC~~ SOSY: 40.0000 mg | PREFILLED_SYRINGE | SUBCUTANEOUS | Status: DC

## 2015-01-03 NOTE — Progress Notes (Addendum)
[facility]  Provider:  Larey Seat, M D  Referring Provider: Caren Macadam, MD Primary Care Physician:  Purvis Kilts, MD  Chief Complaint  Patient presents with  . Follow-up    MS, rm 10, alone    HPI:  [name] is a 59 y.o. female , who  is seen here as a referral from Dr. Micheline Rough , MD and Dr. Hilma Favors for a transfer of care for multiple sclerosis.   [name] reports that she received the diagnosis of multiple sclerosis at age 43.  She was diagnosed by Dr. Arsenio Katz, after years of having symptoms that included skin dysesthesias , numbness and right eye vision Mclean or blurring of vision- Soon afterwards she was seen by Dr. Dellis Filbert ,a multiple sclerosis specialist and followed him from the [facility] office to the [facility] office. She is now looking for followup care , also within the network of her other physicians.  Original diagnosis was established by clinical history, abnormal brain MRI and a spinal tap positive for oligoclonal bands.  A copy of one of her last MRI is is available to me. Her brain MRI with and without contrast was compared to a study from 2012 and documented only  minimal  Disease progression. The patient had mild ventricular enlargement unchanged for the last 3 years. Multiple areas of increased white matter signal in the periventricular space, significant involvement of the corpus callosum and classic Dawson's fingers. Involvement of white matter demyelination in the temporal area. Dr. Starleen Blue interpreted the study as  moderate to severe disease burden , referenced  to her long-standing multiple sclerosis. The patient's most recent blood test results were also attached ( referral papers)- she has a normal white blood cell count she's not anemic, there is  normal kidney function ,normal liver function and  she is not diabetic. She's currently controlled on Copaxone  with 40 MG 3 TIMES A WEEK THE PATIENT SWITCHED LAST SUMMER  FROM daily to the 3 Times Weekly Formulation.  She has Relapsing Remitting Multiple Sclerosis , she has noticed neither  clinical changes nor side effects since switching in the formulation.   06-20-14   [name] has been stable neurologically and overall since our last visit. She has changed to the 3 times weekly form of Copaxone without any problems. She already received her pneumonia shot and her flu shot for this year. She resides in [place] and the community has been suffering under a viral infection wave this winter with the respiratory with respiratory symptoms as well as myalgia. She has so far done very well. She has no recent blood test to review. She moved to a one level apartment , first floor and is happy with the new neighborhood. She lives alone with a cat.   01-03-15.   [name] is here for her regular routine and refill visit. She had no evidence of any relapses she remains hypertensive height per recent flexible sigmoidoscopy but was normal Babinski responses and she has some proximal muscle rigidity but no cogwheeling associated with it all this speaks for a mild progressive form of MS for progression has been very slow and she has been responding well to the medications. This is her 6 year anniversary of the diagnosis of MS.   Review of Systems: Out of a complete 14 system review, the patient complains of only the following symptoms, and all other reviewed systems are negative.  Patient feels hot and humid.  History   Social History  . Marital Status: Single    Spouse Name: N/A  . Number of Children: 0  . Years of Education: College   Occupational History  .     Social History Main Topics  . Smoking status: Current Every Day Smoker -- 1.00 packs/day for 20 years    Types: Cigarettes  . Smokeless tobacco: Never Used  . Alcohol Use: 4.8 oz/week    8 Cans of beer per week  . Drug Use: No  . Sexual Activity: Not on file   Other Topics Concern  . Not on  file   Social History Narrative   Patient is single and lives alone.   Patient has a college education   Patient is right-handed.   Patient is retired.   Patient drinks three cups of coffee daily.    Family History  Problem Relation Age of Onset  . Stomach cancer Father   . Colon cancer Neg Hx   . Sick sinus syndrome Father     Past Medical History  Diagnosis Date  . Multiple sclerosis   . Hiatal hernia   . GERD (gastroesophageal reflux disease)   . Colon polyps   . MS (multiple sclerosis) 12/09/2013    Past Surgical History  Procedure Laterality Date  . Dilation and curettage of uterus    . Colonoscopy    . Colonoscopy N/A 09/16/2012    Procedure: COLONOSCOPY;  Surgeon: Rogene Houston, MD;  [facility];  Service: Endoscopy;  Laterality: N/A;  1030-rescheduled to 9:30 Ann notified pt    Current Outpatient Prescriptions  Medication Sig Dispense Refill  . Cholecalciferol (VITAMIN D3) 3000 UNITS TABS Take 1 tablet by mouth daily.    . Cyanocobalamin (VITAMIN B 12 PO) Take 1 tablet by mouth daily.    . diazepam (VALIUM) 5 MG tablet Take 2.5 mg by mouth at bedtime as needed. Sleep    . esomeprazole (NEXIUM) 40 MG capsule One at night po. 90 capsule 3  . Glatiramer Acetate (COPAXONE) 40 MG/ML SOSY Inject 40 mg into the skin as directed. 3 times a week 36 Syringe 3  . lactobacillus acidophilus (BACID) TABS Take 1 tablet by mouth daily.    Marland Kitchen losartan (COZAAR) 50 MG tablet TK 1 T PO QD  4  . Multiple Vitamin (MULTIVITAMIN WITH MINERALS) TABS Take 1 tablet by mouth daily.    . psyllium (METAMUCIL) 58.6 % powder Take 1 packet by mouth daily.    . Vilazodone HCl (VIIBRYD) 10 MG TABS Take 1 tablet (10 mg total) by mouth daily. Taking 1/2 tablet daily. 90 tablet 1   No current facility-administered medications for this visit.    Allergies as of 01/03/2015 - Review Complete 01/03/2015  Allergen Reaction Noted  . Codeine Nausea And Vomiting 07/13/2012    Vitals: BP  122/80 mmHg  Pulse 76  Resp 20  Ht 5\' 6"  (1.676 m)  Wt 151 lb (68.493 kg)  BMI 24.38 kg/m2 Last Weight:  Wt Readings from Last 1 Encounters:  01/03/15 151 lb (68.493 kg)   Last Height:   Ht Readings from Last 1 Encounters:  01/03/15 5\' 6"  (1.676 m)    Physical exam:  General: The patient is awake, alert and appears  in acute distress from heat and humidity. The patient is well groomed. Head: Normocephalic, atraumatic. Neck is supple. Mallampati 2 , neck circumference: 13.7,  no TMJ pain or click , no delayed swalling  no neck tenderness.  Cardiovascular:  Regular rate and rhythm , without  murmurs or carotid bruit, and without distended neck veins. Respiratory: Lungs are clear to auscultation. Skin:  Without evidence of edema, or rash Trunk: BMI is normal.   Neurologic exam : The patient is awake and alert, oriented to place and time.  Memory subjective described as intact.  There is a normal attention span & concentration ability. Speech is fluent without dysarthria, dysphonia or aphasia. Mood and affect are appropriate.  Cranial nerves:  No change in taste or smell. Pupils are equal and briskly reactive to light. Funduscopic exam without  evidence of pallor or edema.  Extraocular movements  in vertical and horizontal planes intact and with end point nystagmus. Visual fields by finger perimetry are intact. Hearing to finger rub intact.  Facial sensation intact to fine touch.  Facial motor strength is symmetric and tongue and uvula move midline. Motor exam:  Increased tone in the proximal extremities, slight tremor, no rigidity.  Normal tone , muscle bulk and symmetric strength in the upper extremities. She has a weakness to knee extension,  Left over right and weakness of dorsi-flexion on the right.  Sensory:  Fine touch, pinprick and vibration were tested in all extremities. Proprioception is normal.  Coordination: Rapid alternating movements in the fingers/hands is tested  and normal.  Finger-to-nose maneuver tested -evidence of ataxia, dysmetria.   On the right: no pronator drift , very mild bilateral tremor.  Gait and station: Patient walks without assistive device.  Ataxia on tandem gait, heel stand and toe stand were preformed,  Romberg negative. Strength within normal limits. Stance is stable and normal.     Deep tendon reflexes: in the  upper and lower extremities are symmetric and very brisk- 3 plus , but not clonus.  Babinski maneuver response is downgoing .    Assessment:  After physical and neurologic examination, review of laboratory studies, imaging, neurophysiology testing and pre-existing records, assessment is MS.  1) Relapsing- remitting MS , on Copaxone. Needs refill for 6 month.   very mild symptoms of MS but more of chronic progression.   . Patient had her flu shot and her pneumonia vaccine.   Plan:  Treatment plan and additional workup :   Medication refills not needed.  Patient access network needs to sign up.  The next brain and cervical spine MRI for the patient will be scheduled for January 2018 or December 2017.

## 2015-01-03 NOTE — Addendum Note (Signed)
Addended by: Larey Seat on: 01/03/2015 12:24 PM   Modules accepted: Orders

## 2015-01-03 NOTE — Addendum Note (Signed)
Addended by: Larey Seat on: 01/03/2015 12:20 PM   Modules accepted: Orders, Level of Service

## 2015-04-23 ENCOUNTER — Other Ambulatory Visit: Payer: Self-pay

## 2015-04-23 DIAGNOSIS — K2100 Gastro-esophageal reflux disease with esophagitis, without bleeding: Secondary | ICD-10-CM

## 2015-04-23 DIAGNOSIS — G35 Multiple sclerosis: Secondary | ICD-10-CM

## 2015-04-23 DIAGNOSIS — G4700 Insomnia, unspecified: Secondary | ICD-10-CM

## 2015-04-23 MED ORDER — ESOMEPRAZOLE MAGNESIUM 40 MG PO CPDR: DELAYED_RELEASE_CAPSULE | ORAL | Status: DC

## 2015-04-23 NOTE — Telephone Encounter (Signed)
Originally prescribed at [facility] in Dec

## 2015-05-17 ENCOUNTER — Other Ambulatory Visit: Payer: Self-pay | Admitting: Neurology

## 2016-01-02 ENCOUNTER — Encounter: Payer: Self-pay | Admitting: Neurology

## 2016-01-02 ENCOUNTER — Ambulatory Visit (INDEPENDENT_AMBULATORY_CARE_PROVIDER_SITE_OTHER): Payer: Medicare Other | Admitting: Neurology

## 2016-01-02 VITALS — BP 140/88 | HR 84 | Resp 20 | Ht 66.0 in | Wt 144.0 lb

## 2016-01-02 DIAGNOSIS — G35 Multiple sclerosis: Secondary | ICD-10-CM

## 2016-01-02 NOTE — Patient Instructions (Signed)
OCREVUS>

## 2016-01-02 NOTE — Progress Notes (Signed)
[facility]  Provider:  Larey Seat, M D  Referring Provider: Sharilyn Sites, MD Primary Care Physician:  Purvis Kilts, MD  Chief Complaint  Patient presents with  . Follow-up    MS going well, on copaxone    HPI:  [name] is a 60 y.o. female , who  is seen here as a referral from Dr. Julious Oka , MD and Dr. Hilma Favors for a transfer of care for multiple sclerosis.   [name] reports that she received the diagnosis of multiple sclerosis at age 25.  She was diagnosed by Dr. Arsenio Katz, after years of having symptoms that included skin dysesthesias , numbness and right eye vision loss or blurring of vision- Soon afterwards she was seen by Dr. Dellis Filbert ,a multiple sclerosis specialist and followed him from the [facility] office to the [facility] office. She is now looking for followup care , also within the network of her other physicians.  Original diagnosis was established by clinical history, abnormal brain MRI and a spinal tap positive for oligoclonal bands.  A copy of one of her last MRI is is available to me. Her brain MRI with and without contrast was compared to a study from 2012 and documented only  minimal  Disease progression. The patient had mild ventricular enlargement unchanged for the last 3 years. Multiple areas of increased white matter signal in the periventricular space, significant involvement of the corpus callosum and classic Dawson's fingers. Involvement of white matter demyelination in the temporal area. Dr. Starleen Blue interpreted the study as  moderate to severe disease burden , referenced  to her long-standing multiple sclerosis. The patient's most recent blood test results were also attached ( referral papers)- she has a normal white blood cell count she's not anemic, there is  normal kidney function ,normal liver function and  she is not diabetic. She's currently controlled on Copaxone  with 40 MG 3 TIMES A WEEK THE PATIENT SWITCHED LAST SUMMER  FROM daily to the 3 Times Weekly Formulation.  She has Relapsing Remitting Multiple Sclerosis , she has noticed neither  clinical changes nor side effects since switching in the formulation.    06-20-14   Mrs. [name] has been stable neurologically and overall since our last visit. She has changed to the 3 times weekly form of Copaxone without any problems. She already received her pneumonia shot and her flu shot for this year. She resides in [place] and the community has been suffering under a viral infection wave this winter with the respiratory with respiratory symptoms as well as myalgia. She has so far done very well. She has no recent blood test to review. She moved to a one level apartment , first floor and is happy with the new neighborhood. She lives alone with a cat.   01-03-15. Mrs. [name] is here for her regular routine and refill visit. She had no evidence of any relapses she remains hypertensive height per recent flexible sigmoidoscopy but was normal Babinski responses and she has some proximal muscle rigidity but no cogwheeling associated with it all this speaks for a mild progressive form of MS for progression has been very slow and she has been responding well to the medications. This is her 76 year anniversary of the diagnosis of MS.  01-02-2016, Mrs. [name] has not felt any impairment, no relapse or remitting symptoms of MS. We will repeat a brain MRI. Her original diagnosis was established by clinical history abnormal brain MRI and oligoclonal  bands in her cerebral spinal fluid. We discussed today when she can have her MRI and she would like it in November or even December of this year at [place] location. She would like an open MRI.   Review of Systems: Out of a complete 14 system review, the patient complains of only the following symptoms, and all other reviewed systems are negative.  Patient feels hot and weeker in humidity.    Social History   Social  History  . Marital Status: Single    Spouse Name: N/A  . Number of Children: 0  . Years of Education: College   Occupational History  .     Social History Main Topics  . Smoking status: Current Every Day Smoker -- 1.00 packs/day for 20 years    Types: Cigarettes  . Smokeless tobacco: Never Used  . Alcohol Use: 4.8 oz/week    8 Cans of beer per week  . Drug Use: No  . Sexual Activity: Not on file   Other Topics Concern  . Not on file   Social History Narrative   Patient is single and lives alone.   Patient has a college education   Patient is right-handed.   Patient is retired.   Patient drinks three cups of coffee daily.    Family History  Problem Relation Age of Onset  . Stomach cancer Father   . Colon cancer Neg Hx   . Sick sinus syndrome Father     Past Medical History  Diagnosis Date  . Multiple sclerosis ([place])   . Hiatal hernia   . GERD (gastroesophageal reflux disease)   . Colon polyps   . MS (multiple sclerosis) ([place]) 12/09/2013    Past Surgical History  Procedure Laterality Date  . Dilation and curettage of uterus    . Colonoscopy    . Colonoscopy N/A 09/16/2012    Procedure: COLONOSCOPY;  Surgeon: Rogene Houston, MD;  Location[facility];  Service: Endoscopy;  Laterality: N/A;  1030-rescheduled to 9:30 Ann notified pt    Current Outpatient Prescriptions  Medication Sig Dispense Refill  . Cholecalciferol (VITAMIN D3) 3000 UNITS TABS Take 1 tablet by mouth daily.    . Cyanocobalamin (VITAMIN B 12 PO) Take 1 tablet by mouth daily.    . diazepam (VALIUM) 5 MG tablet Take 2.5 mg by mouth at bedtime as needed. Sleep    . esomeprazole (NEXIUM) 40 MG capsule One at night po. 90 capsule 3  . Glatiramer Acetate (COPAXONE) 40 MG/ML SOSY Inject 40 mg into the skin as directed. 3 times a week 36 Syringe 5  . lactobacillus acidophilus (BACID) TABS Take 1 tablet by mouth daily.    Marland Kitchen losartan (COZAAR) 50 MG tablet TK 1 T PO QD  4  . Multiple Vitamin  (MULTIVITAMIN WITH MINERALS) TABS Take 1 tablet by mouth daily.    . psyllium (METAMUCIL) 58.6 % powder Take 1 packet by mouth daily.    Marland Kitchen VIIBRYD 10 MG TABS TAKE 1 TABLET BY MOUTH DAILY OR AS DIRECTED BY DOCTOR 90 tablet 1   No current facility-administered medications for this visit.    Allergies as of 01/02/2016 - Review Complete 01/02/2016  Allergen Reaction Noted  . Codeine Nausea And Vomiting 07/13/2012    Vitals: BP 140/88 mmHg  Pulse 84  Resp 20  Ht 5\' 6"  (1.676 m)  Wt 144 lb (65.318 kg)  BMI 23.25 kg/m2 Last Weight:  Wt Readings from Last 1 Encounters:  01/02/16  144 lb (65.318 kg)   Last Height:   Ht Readings from Last 1 Encounters:  01/02/16 5\' 6"  (1.676 m)    Physical exam:  General: The patient is awake, alert and appears  in acute distress from heat and humidity. The patient is well groomed. Head: Normocephalic, atraumatic. Neck is supple. Mallampati 2 , neck circumference: 13.7,  no TMJ pain or click , no delayed swalling  no neck tenderness.  Cardiovascular:  Regular rate and rhythm , without  murmurs or carotid bruit, and without distended neck veins. Respiratory: Lungs are clear to auscultation. Skin:  Without evidence of edema, or rash Trunk: BMI is normal.   Neurologic exam : The patient is awake and alert, oriented to place and time.  Memory subjective described as intact.  There is a normal attention span & concentration ability. Speech is fluent without dysarthria, dysphonia or aphasia. Mood and affect are appropriate.  Cranial nerves:  No change in taste or smell. Pupils are equal and briskly reactive to light. Funduscopic exam without  evidence of pallor or edema.  Extraocular movements  in vertical and horizontal planes intact and with end point nystagmus. Visual fields by finger perimetry are intact. Hearing to finger rub intact.  Facial sensation intact to fine touch.  Facial motor strength is symmetric and tongue and uvula move midline. Motor  exam:  Increased tone in the proximal extremities, slight tremor, no rigidity.  Normal tone , muscle bulk and symmetric strength in the upper extremities. She has a weakness to knee extension,  Left over right and weakness of dorsi-flexion on the right.  Sensory:  Fine touch, pinprick and vibration were tested in all extremities. Proprioception is normal.  Coordination: Rapid alternating movements in the fingers/hands is tested and normal.  Finger-to-nose maneuver tested -evidence of ataxia, dysmetria.   On the right: no pronator drift , very mild bilateral tremor.  Gait and station: Patient walks without assistive device.  Ataxia on tandem gait, heel stand and toe stand were preformed,  Romberg negative. Strength within normal limits. Stance is stable and normal.    Deep tendon reflexes: in the  upper and lower extremities are symmetric and very brisk- 3 plus , but not clonus.   Babinski maneuver response is downgoing .  Assessment:  After physical and neurologic examination, review of laboratory studies, imaging, neurophysiology testing and pre-existing records, assessment is MS.  1) Relapsing- remitting MS , on Copaxone. Needs refill for 6 month.   very mild symptoms of MS but more of chronic progression.  Patient had her flu shot and her pneumonia vaccine.   Plan:  Treatment plan and additional workup :   Medication refills not needed.  She may consider OCREVUS. I gave her a pamphlet about this medication.  Patient access to medical information network - needs to sign up.   The next brain  MRI for the patient will be scheduled for December 2017.

## 2016-01-03 LAB — COMPREHENSIVE METABOLIC PANEL
A/G RATIO: 2.2 (ref 1.2–2.2)
ALBUMIN: 5.1 g/dL (ref 3.5–5.5)
ALT: 21 IU/L (ref 0–32)
AST: 23 IU/L (ref 0–40)
Alkaline Phosphatase: 79 IU/L (ref 39–117)
BILIRUBIN TOTAL: 0.5 mg/dL (ref 0.0–1.2)
BUN / CREAT RATIO: 12 (ref 9–23)
BUN: 9 mg/dL (ref 6–24)
CHLORIDE: 94 mmol/L — AB (ref 96–106)
CO2: 23 mmol/L (ref 18–29)
Calcium: 10.1 mg/dL (ref 8.7–10.2)
Creatinine, Ser: 0.77 mg/dL (ref 0.57–1.00)
GFR calc non Af Amer: 85 mL/min/{1.73_m2} (ref >59)
GFR, EST AFRICAN AMERICAN: 98 mL/min/{1.73_m2} (ref >59)
Globulin, Total: 2.3 g/dL (ref 1.5–4.5)
Glucose: 99 mg/dL (ref 65–99)
POTASSIUM: 4.4 mmol/L (ref 3.5–5.2)
Sodium: 138 mmol/L (ref 134–144)
TOTAL PROTEIN: 7.4 g/dL (ref 6.0–8.5)

## 2016-01-10 ENCOUNTER — Telehealth: Payer: Self-pay

## 2016-01-10 ENCOUNTER — Other Ambulatory Visit: Payer: Self-pay | Admitting: Neurology

## 2016-01-10 DIAGNOSIS — K2100 Gastro-esophageal reflux disease with esophagitis, without bleeding: Secondary | ICD-10-CM

## 2016-01-10 DIAGNOSIS — G35 Multiple sclerosis: Secondary | ICD-10-CM

## 2016-01-10 DIAGNOSIS — G4700 Insomnia, unspecified: Secondary | ICD-10-CM

## 2016-01-10 MED ORDER — GLATIRAMER ACETATE 40 MG/ML ~~LOC~~ SOSY: 40.0000 mg | PREFILLED_SYRINGE | SUBCUTANEOUS | Status: DC

## 2016-01-10 NOTE — Telephone Encounter (Signed)
Pt called about labs. I relayed to her they are normal. She expressed understanding and did not have any questions.

## 2016-01-10 NOTE — Telephone Encounter (Signed)
Patient requesting refill of Glatiramer Acetate (COPAXONE) 40 MG/ML [place]: Carley [phone]

## 2016-01-10 NOTE — Telephone Encounter (Signed)
I called pt to advise her of normal labs.  No answer, left a message asking her to call me back.

## 2016-01-10 NOTE — Telephone Encounter (Signed)
RX for copaxone faxed to [facility]. Received a receipt of confirmation.

## 2016-01-10 NOTE — Telephone Encounter (Signed)
-----   Message from Larey Seat, MD sent at 01/09/2016 11:26 AM EDT ----- Normal CMET, CD

## 2016-01-11 ENCOUNTER — Other Ambulatory Visit: Payer: Self-pay | Admitting: Neurology

## 2016-01-11 MED ORDER — VILAZODONE HCL 10 MG PO TABS: ORAL_TABLET | ORAL | Status: DC

## 2016-01-11 NOTE — Telephone Encounter (Signed)
Patient called to request refill of VIIBRYD 10 MG TABS to [facility]

## 2016-01-11 NOTE — Telephone Encounter (Signed)
Vibryd escribed to [facility] per faxed request/fim

## 2016-01-16 ENCOUNTER — Telehealth: Payer: Self-pay

## 2016-01-16 NOTE — Telephone Encounter (Signed)
Received a notice that pt has been approved for the patient access network. "Assistance for [name] is in the form of reimbursement for cost sharing associated with specific, documented out-of-pocket medication expenses related to MS-MCR. Reimbursement is available for the eligibility period of 06/24/2015 to 06/22/2016 or when the benefit cap has been met. The amount of the grant for this eligibility period is $5,000.  May contact April at [phone] for questions.

## 2016-02-01 ENCOUNTER — Other Ambulatory Visit (HOSPITAL_COMMUNITY): Payer: Self-pay | Admitting: Family Medicine

## 2016-02-01 DIAGNOSIS — Z1231 Encounter for screening mammogram for malignant neoplasm of breast: Secondary | ICD-10-CM

## 2016-02-07 ENCOUNTER — Ambulatory Visit (HOSPITAL_COMMUNITY): Payer: PRIVATE HEALTH INSURANCE

## 2016-02-27 ENCOUNTER — Ambulatory Visit (HOSPITAL_COMMUNITY)
Admission: RE | Admit: 2016-02-27 | Discharge: 2016-02-27 | Disposition: A | Discharge status: Home / Self Care | Payer: Medicare Other | Source: Ambulatory Visit | Attending: Family Medicine | Admitting: Family Medicine

## 2016-02-27 DIAGNOSIS — Z1231 Encounter for screening mammogram for malignant neoplasm of breast: Secondary | ICD-10-CM | POA: Diagnosis not present

## 2016-03-18 DIAGNOSIS — G35 Multiple sclerosis: Secondary | ICD-10-CM | POA: Diagnosis not present

## 2016-03-20 ENCOUNTER — Telehealth: Payer: Self-pay | Admitting: Neurology

## 2016-03-20 NOTE — Telephone Encounter (Signed)
I do not see results yet for this pt's MRI. Will call her with results as soon as they are received.

## 2016-03-20 NOTE — Telephone Encounter (Signed)
Pt called request MRI results. She had MRI on Tuesday 9/26. She was advised it has not been rec'd at clinic yet, but I would send a msg to RN to call when results have been read.

## 2016-03-24 ENCOUNTER — Ambulatory Visit (INDEPENDENT_AMBULATORY_CARE_PROVIDER_SITE_OTHER): Payer: Self-pay

## 2016-03-24 DIAGNOSIS — Z0289 Encounter for other administrative examinations: Secondary | ICD-10-CM

## 2016-03-24 DIAGNOSIS — G35 Multiple sclerosis: Secondary | ICD-10-CM

## 2016-03-26 ENCOUNTER — Telehealth: Payer: Self-pay

## 2016-03-26 NOTE — Telephone Encounter (Signed)
I spoke to Dr. [name]. She says it is ok for [name] to follow up yearly since her MRI is stable. I called [name] and advised her of this. [name] is agreeable to seeing Dr. [name] on 01/06/2017 at 11:00.

## 2016-03-26 NOTE — Telephone Encounter (Signed)
I spoke to pt and advised her that Dr. [name] reviewed her MRI and found that there has been no interval changes since last imaging study, old MS plaques seen, and chronic changes noted. I will send a copy to Dr. Hilma Favors, pt's PCP.  Pt states that she does not understand why she needs to see Dr. [name] next month when her MRI is stable and she does not plan on changing medications for her MS. She wants me to speak to Dr. [name] and ask if it is ok for pt to come back yearly. She says that the $50 copay is a lot of money to have to come every 3 months. I will speak to Dr. [name]. Pt verbalized understanding of results. Pt had no further questions at this time but was encouraged to call back if questions arise.

## 2016-03-26 NOTE — Telephone Encounter (Signed)
I spoke to pt regarding her MRI results. See telephone note from 03/26/2016.

## 2016-03-26 NOTE — Telephone Encounter (Signed)
-----   Message from Larey Seat, MD sent at 03/26/2016 12:41 PM EDT ----- No interval changes since last imaging study. Old MS plaques, chronic changes noted. CD FYI to PCP Dr Hilma Favors

## 2016-04-30 ENCOUNTER — Ambulatory Visit: Payer: Medicare Other | Admitting: Neurology

## 2016-05-08 ENCOUNTER — Other Ambulatory Visit: Payer: Self-pay | Admitting: Neurology

## 2016-05-08 DIAGNOSIS — G4700 Insomnia, unspecified: Secondary | ICD-10-CM

## 2016-05-08 DIAGNOSIS — K2100 Gastro-esophageal reflux disease with esophagitis, without bleeding: Secondary | ICD-10-CM

## 2016-05-08 DIAGNOSIS — G35 Multiple sclerosis: Secondary | ICD-10-CM

## 2016-08-02 ENCOUNTER — Other Ambulatory Visit: Payer: Self-pay | Admitting: Neurology

## 2016-08-02 DIAGNOSIS — K2100 Gastro-esophageal reflux disease with esophagitis, without bleeding: Secondary | ICD-10-CM

## 2016-08-02 DIAGNOSIS — G4700 Insomnia, unspecified: Secondary | ICD-10-CM

## 2016-08-02 DIAGNOSIS — G35 Multiple sclerosis: Secondary | ICD-10-CM

## 2016-08-06 ENCOUNTER — Ambulatory Visit (HOSPITAL_COMMUNITY)
Admission: RE | Admit: 2016-08-06 | Discharge: 2016-08-06 | Disposition: A | Discharge status: Home / Self Care | Payer: Medicare Other | Source: Ambulatory Visit | Attending: Family Medicine | Admitting: Family Medicine

## 2016-08-06 ENCOUNTER — Other Ambulatory Visit (HOSPITAL_COMMUNITY): Payer: Self-pay | Admitting: Family Medicine

## 2016-08-06 DIAGNOSIS — R0789 Other chest pain: Secondary | ICD-10-CM | POA: Insufficient documentation

## 2016-08-13 ENCOUNTER — Other Ambulatory Visit (HOSPITAL_COMMUNITY): Payer: Self-pay | Admitting: Family Medicine

## 2016-08-13 DIAGNOSIS — N644 Mastodynia: Secondary | ICD-10-CM

## 2016-08-26 ENCOUNTER — Ambulatory Visit (HOSPITAL_COMMUNITY)
Admission: RE | Admit: 2016-08-26 | Discharge: 2016-08-26 | Disposition: A | Discharge status: Home / Self Care | Payer: Medicare Other | Source: Ambulatory Visit | Attending: Family Medicine | Admitting: Family Medicine

## 2016-08-26 DIAGNOSIS — N644 Mastodynia: Secondary | ICD-10-CM

## 2016-11-14 ENCOUNTER — Other Ambulatory Visit: Payer: Self-pay | Admitting: *Deleted

## 2016-11-14 DIAGNOSIS — K2100 Gastro-esophageal reflux disease with esophagitis, without bleeding: Secondary | ICD-10-CM

## 2016-11-14 DIAGNOSIS — G4700 Insomnia, unspecified: Secondary | ICD-10-CM

## 2016-11-14 DIAGNOSIS — G35 Multiple sclerosis: Secondary | ICD-10-CM

## 2016-11-14 MED ORDER — GLATIRAMER ACETATE 40 MG/ML ~~LOC~~ SOSY: 40.0000 mg | PREFILLED_SYRINGE | SUBCUTANEOUS | 5 refills | Status: DC

## 2016-12-01 ENCOUNTER — Other Ambulatory Visit: Payer: Self-pay | Admitting: Neurology

## 2016-12-01 DIAGNOSIS — K2100 Gastro-esophageal reflux disease with esophagitis, without bleeding: Secondary | ICD-10-CM

## 2016-12-01 DIAGNOSIS — G35 Multiple sclerosis: Secondary | ICD-10-CM

## 2016-12-01 DIAGNOSIS — G4700 Insomnia, unspecified: Secondary | ICD-10-CM

## 2017-01-06 ENCOUNTER — Encounter: Payer: Self-pay | Admitting: Neurology

## 2017-01-06 ENCOUNTER — Ambulatory Visit (INDEPENDENT_AMBULATORY_CARE_PROVIDER_SITE_OTHER): Payer: Medicare Other | Admitting: Neurology

## 2017-01-06 ENCOUNTER — Telehealth: Payer: Self-pay | Admitting: Neurology

## 2017-01-06 VITALS — BP 118/77 | HR 88 | Ht 65.0 in | Wt 147.5 lb

## 2017-01-06 DIAGNOSIS — G4700 Insomnia, unspecified: Secondary | ICD-10-CM | POA: Insufficient documentation

## 2017-01-06 DIAGNOSIS — K2100 Gastro-esophageal reflux disease with esophagitis, without bleeding: Secondary | ICD-10-CM

## 2017-01-06 DIAGNOSIS — G35 Multiple sclerosis: Secondary | ICD-10-CM | POA: Diagnosis not present

## 2017-01-06 MED ORDER — ESOMEPRAZOLE MAGNESIUM 40 MG PO CPDR: 40.0000 mg | DELAYED_RELEASE_CAPSULE | Freq: Every day | ORAL | 1 refills | Status: DC

## 2017-01-06 MED ORDER — VILAZODONE HCL 10 MG PO TABS: ORAL_TABLET | ORAL | 3 refills | Status: DC

## 2017-01-06 NOTE — Patient Instructions (Signed)

## 2017-01-06 NOTE — Telephone Encounter (Signed)
[employer] sent a letter confirming they have approved assistance for [employer].  Pt # [id number] Group # [id number] Assistance starting 01/14/2017-05/13/2018 In amount of 5,000.covering Copaxone for MS fund.  Call [place] with any questions [phone]

## 2017-01-06 NOTE — Addendum Note (Signed)
Addended by: Larey Seat on: 01/06/2017 11:32 AM   Modules accepted: Orders

## 2017-01-06 NOTE — Progress Notes (Signed)
[facility]  Provider:  Larey Seat, M D  Referring Provider: Sharilyn Sites, MD Primary Care Physician:  Sharilyn Sites, MD  Chief Complaint  Patient presents with  . Follow-up    HPI:  [name] is a 61 y.o. female , who was originally seen here as a referral from Dr. Andris Flurry at the offices of  Dr. Hilma Favors , MD for a transfer of care for multiple sclerosis.  [name] reports that she received the diagnosis of multiple sclerosis at age 61. She was diagnosed by Dr. Arsenio Katz, after years of having symptoms that included skin dysesthesias , numbness and right eye vision loss or blurring of vision- Soon afterwards she was seen by Dr. Dellis Filbert ,a multiple sclerosis specialist and followed him from the [facility] office to the [facility] office. She is now looking for followup care , also within the network of her other physicians.  Original diagnosis was established by clinical history, abnormal brain MRI and a spinal tap positive for oligoclonal bands. A copy of one of her last MRI is is available to me. Her brain MRI with and without contrast was compared to a study from 2012 and documented only  minimal  Disease progression. The patient had mild ventricular enlargement unchanged for the last 3 years. Multiple areas of increased white matter signal in the periventricular space, significant involvement of the corpus callosum and classic Dawson's fingers. Involvement of white matter demyelination in the temporal area. Dr. Starleen Blue interpreted the study as  moderate to severe disease burden , referenced  to her long-standing multiple sclerosis. The patient's most recent blood test results were also attached ( referral papers)- she has a normal white blood cell count she's not anemic, there is  normal kidney function ,normal liver function and  she is not diabetic. She's currently controlled on Copaxone  with 40 MG 3 TIMES A WEEK THE PATIENT SWITCHED LAST SUMMER FROM daily to the 3 Times  Weekly Formulation.  She has Relapsing Remitting Multiple Sclerosis , she has noticed neither  clinical changes nor side effects since switching in the formulation.    06-20-14  [name] has been stable neurologically and overall since our last visit. She has changed to the 3 times weekly form of Copaxone without any problems. She already received her pneumonia shot and her flu shot for this year. She resides in [place] and the community has been suffering under a viral infection wave this winter with the respiratory with respiratory symptoms as well as myalgia. She has so far done very well. She has no recent blood test to review. She moved to a one level apartment , first floor and is happy with the new neighborhood. She lives alone with a cat.   01-03-2015. [name] is here for her regular routine and refill visit. She had no evidence of any relapses she remains hypertensive height per recent flexible sigmoidoscopy but was normal Babinski responses and she has some proximal muscle rigidity but no cogwheeling associated with it all this speaks for a mild progressive form of MS for progression has been very slow and she has been responding well to the medications. This is her 70 year anniversary of the diagnosis of MS.  01-02-2016, [name] has not felt any impairment, no relapse or remitting symptoms of MS. We will repeat a brain MRI. Her original diagnosis was established by clinical history abnormal brain MRI and oligoclonal bands in her cerebral spinal fluid. We discussed today when she  can have her MRI and she would like it in November or even December of this year at [place] location. She would like an open MRI.  Interval history from 01/06/2017 for this established 61 year old Caucasian patient [name], who carries a diagnosis of multiple sclerosis.Heat and humidity affect her.    The patient is currently treated with Copaxone 40 mg 3 times a week and has had no  evidence of a relapse since. She has been stable for the last 4 years. Her diagnosis was established on 7-15- 2006. She drives, lives alone and is able to perform all activities of daily living, handles her own finances. I like for her to have a new MRI - she would like to have it at [facility].    Review of Systems: Out of a complete 14 system review, the patient complains of only the following symptoms, and all other reviewed systems are negative.  Patient feels hot and weeker in humidity.    Social History   Social History  . Marital status: Single    Spouse name: N/A  . Number of children: 0  . Years of education: College   Occupational History  .  Disabled   Social History Main Topics  . Smoking status: Current Every Day Smoker    Packs/day: 1.00    Years: 20.00    Types: Cigarettes  . Smokeless tobacco: Never Used  . Alcohol use 4.8 oz/week    8 Cans of beer per week  . Drug use: No  . Sexual activity: Not on file   Other Topics Concern  . Not on file   Social History Narrative   Patient is single and lives alone.   Patient has a college education   Patient is right-handed.   Patient is retired.   Patient drinks three cups of coffee daily.    Family History  Problem Relation Age of Onset  . Stomach cancer Father   . Colon cancer Neg Hx   . Sick sinus syndrome Father     Past Medical History:  Diagnosis Date  . Colon polyps   . GERD (gastroesophageal reflux disease)   . Hiatal hernia   . MS (multiple sclerosis) ([place]) 12/09/2013  . Multiple sclerosis ([place])     Past Surgical History:  Procedure Laterality Date  . COLONOSCOPY    . COLONOSCOPY N/A 09/16/2012   Procedure: COLONOSCOPY;  Surgeon: Rogene Houston, MD;  [facility];  Service: Endoscopy;  Laterality: N/A;  1030-rescheduled to 9:30 Ann notified pt  . DILATION AND CURETTAGE OF UTERUS      Current Outpatient Prescriptions  Medication Sig Dispense Refill  . Cholecalciferol  (VITAMIN D3) 3000 UNITS TABS Take 1 tablet by mouth daily.    . Cyanocobalamin (VITAMIN B 12 PO) Take 1 tablet by mouth daily.    . diazepam (VALIUM) 5 MG tablet Take 2.5 mg by mouth at bedtime as needed. Sleep    . esomeprazole (NEXIUM) 40 MG capsule TAKE 1 CAPSULE BY MOUTH AT NIGHT 90 capsule 0  . Glatiramer Acetate (COPAXONE) 40 MG/ML SOSY Inject 40 mg into the skin as directed. 3 times a week 36 Syringe 5  . lactobacillus acidophilus (BACID) TABS Take 1 tablet by mouth daily.    Marland Kitchen losartan (COZAAR) 50 MG tablet TK 1 T PO QD  4  . Multiple Vitamin (MULTIVITAMIN WITH MINERALS) TABS Take 1 tablet by mouth daily.    . psyllium (METAMUCIL) 58.6 % powder Take  1 packet by mouth daily.    . Vilazodone HCl (VIIBRYD) 10 MG TABS TAKE 1 TABLET BY MOUTH DAILY OR AS DIRECTED BY DOCTOR 90 tablet 1   No current facility-administered medications for this visit.     Allergies as of 01/06/2017 - Review Complete 01/06/2017  Allergen Reaction Noted  . Codeine Nausea And Vomiting 07/13/2012    Vitals: BP 118/77   Pulse 88   Ht 5\' 5"  (1.651 m)   Wt 147 lb 8 oz (66.9 kg)   BMI 24.55 kg/m  Last Weight:  Wt Readings from Last 1 Encounters:  01/06/17 147 lb 8 oz (66.9 kg)   Last Height:   Ht Readings from Last 1 Encounters:  01/06/17 5\' 5"  (1.651 m)    Physical exam:  General: The patient is awake, alert and appears  in acute distress from heat and humidity. The patient is well groomed. Head: Normocephalic, atraumatic. Neck is supple. Mallampati 2 , neck circumference: 13.7,  no TMJ pain or click , no delayed swalling  no neck tenderness.  Cardiovascular:  Regular rate and rhythm , without  murmurs or carotid bruit, and without distended neck veins. Respiratory: Lungs are clear to auscultation. Skin:  Without evidence of edema, or rash Trunk: BMI is normal.   Neurologic exam : The patient is awake and alert, oriented to place and time.  Memory subjective described as intact.  There is a  normal attention span & concentration ability. Speech is fluent without dysarthria, dysphonia or aphasia. Mood and affect are appropriate.  Cranial nerves:  No change in taste or smell. Pupils are equal and briskly reactive to light.  Extraocular movements  in vertical and horizontal planes intact and with end point nystagmus.  Visual fields by finger perimetry are intact. Hearing to finger rub intact.  Facial sensation intact to fine touch.  Facial motor strength is symmetric,  tongue and uvula move midline. Motor exam:  Increased tone in the proximal extremities, slight tremor, no rigidity.  Normal tone , muscle bulk and symmetric strength in the upper extremities.  Sensory:  Fine touch, pinprick and vibration were tested in all extremities. Proprioception is normal.  Coordination: Rapid alternating movements in the fingers/hands is tested and normal.  Finger-to-nose maneuver tested - no evidence of ataxia, dysmetria.   On the right: no pronator drift , no bilateral tremor seen today.  Gait and station: Patient walks without assistive device.   Ataxia on tandem gait, heel stand and toe stand were deferred,  Romberg negative. Strength within normal limits. Stance is stable.    Deep tendon reflexes: in the  upper and lower extremities are symmetric and very brisk- 3 plus , but not clonus.   Babinski maneuver response is downgoing .  Assessment:  After physical and neurologic examination, review of laboratory studies, imaging, neurophysiology testing and pre-existing records, assessment is MS.  1) Relapsing- remitting MS , on Copaxone. Needs refill for 12 month- with continuous very mild symptoms of MS , no clinical relapses- more of chronic progression.       Medication refills not needed.  She did not consider OCREVUS. would consider Tysabri- but not [place].   I gave her a pamphlet about Tysabri  medication.  She will need a JCV titer but than decided she will not need it until she has  a next MRI results.  Patient access to medical information network - needs to sign up.   The next brain  MRI and c spine  for  the patient will be scheduled for June 2019     Larey Seat, MD

## 2017-02-06 ENCOUNTER — Emergency Department (HOSPITAL_COMMUNITY): Payer: Medicare Other

## 2017-02-06 ENCOUNTER — Emergency Department (HOSPITAL_COMMUNITY)
Admission: EM | Admit: 2017-02-06 | Discharge: 2017-02-06 | Disposition: A | Discharge status: Home / Self Care | Payer: Medicare Other | Attending: Emergency Medicine | Admitting: Emergency Medicine

## 2017-02-06 ENCOUNTER — Encounter (HOSPITAL_COMMUNITY): Payer: Self-pay | Admitting: *Deleted

## 2017-02-06 DIAGNOSIS — Y999 Unspecified external cause status: Secondary | ICD-10-CM | POA: Diagnosis not present

## 2017-02-06 DIAGNOSIS — S4992XA Unspecified injury of left shoulder and upper arm, initial encounter: Secondary | ICD-10-CM | POA: Diagnosis present

## 2017-02-06 DIAGNOSIS — W01198A Fall on same level from slipping, tripping and stumbling with subsequent striking against other object, initial encounter: Secondary | ICD-10-CM | POA: Diagnosis not present

## 2017-02-06 DIAGNOSIS — Y92008 Other place in unspecified non-institutional (private) residence as the place of occurrence of the external cause: Secondary | ICD-10-CM | POA: Diagnosis not present

## 2017-02-06 DIAGNOSIS — S42352A Displaced comminuted fracture of shaft of humerus, left arm, initial encounter for closed fracture: Secondary | ICD-10-CM | POA: Diagnosis not present

## 2017-02-06 DIAGNOSIS — Y9389 Activity, other specified: Secondary | ICD-10-CM | POA: Diagnosis not present

## 2017-02-06 DIAGNOSIS — F17210 Nicotine dependence, cigarettes, uncomplicated: Secondary | ICD-10-CM | POA: Insufficient documentation

## 2017-02-06 DIAGNOSIS — Z79899 Other long term (current) drug therapy: Secondary | ICD-10-CM | POA: Diagnosis not present

## 2017-02-06 MED ORDER — HYDROCODONE-ACETAMINOPHEN 5-325 MG PO TABS: ORAL_TABLET | ORAL | 0 refills | Status: DC

## 2017-02-06 MED ORDER — METHOCARBAMOL 500 MG PO TABS: 1000.0000 mg | ORAL_TABLET | Freq: Four times a day (QID) | ORAL | 0 refills | Status: DC | PRN

## 2017-02-06 MED ORDER — OXYCODONE-ACETAMINOPHEN 5-325 MG PO TABS
2.0000 | ORAL_TABLET | Freq: Once | ORAL | Status: AC
  Administered 2017-02-06: 2 via ORAL
  Filled 2017-02-06: qty 2

## 2017-02-06 NOTE — ED Provider Notes (Signed)
[facility] DEPT Provider Note   CSN: [id number] Arrival date & time: 02/06/17  1750     History   Chief Complaint Chief Complaint  Patient presents with  . Arm Pain    HPI [name] is a 61 y.o. female.  HPI  Pt was seen at 1810. Per pt, c/o sudden onset and resolution of one episode of slip and fall at home PTA. Pt states she was outside brushing the cobwebs off her windows when she slipped and fell into the brick wall, hitting her left upper arm. Pt c/o left upper arm "pain" since the fall. Pain increases with palpation of the area and attempted movement. Denies any other injuries. Denies hitting head, no LOC, no AMS, no neck or back pain, no prodromal symptoms before fall, no CP/SOB, no abd pain, no N/V/D.   Past Medical History:  Diagnosis Date  . Colon polyps   . GERD (gastroesophageal reflux disease)   . Hiatal hernia   . MS (multiple sclerosis) ([place]) 12/09/2013  . Multiple sclerosis [facility])     Patient Active Problem List   Diagnosis Date Noted  . Insomnia, controlled 01/06/2017  . MS (multiple sclerosis) ([place]) 12/09/2013    Past Surgical History:  Procedure Laterality Date  . COLONOSCOPY    . COLONOSCOPY N/A 09/16/2012   Procedure: COLONOSCOPY;  Surgeon: Rogene Houston, MD;  Location[facility];  Service: Endoscopy;  Laterality: N/A;  1030-rescheduled to 9:30 Ann notified pt  . DILATION AND CURETTAGE OF UTERUS      OB History    No data available       Home Medications    Prior to Admission medications   Medication Sig Start Date End Date Taking? Authorizing Provider  Cholecalciferol (VITAMIN D3) 3000 UNITS TABS Take 1 tablet by mouth daily.    [provider]  Cyanocobalamin (VITAMIN B 12 PO) Take 1 tablet by mouth daily.    [provider]  diazepam (VALIUM) 5 MG tablet Take 2.5 mg by mouth at bedtime as needed. Sleep    [provider]  esomeprazole (NEXIUM) 40 MG capsule Take 1 capsule (40 mg total) by mouth daily  at 12 noon. 01/06/17   Dohmeier, Asencion Partridge, MD  Glatiramer Acetate (COPAXONE) 40 MG/ML SOSY Inject 40 mg into the skin as directed. 3 times a week 11/14/16   Dohmeier, Asencion Partridge, MD  lactobacillus acidophilus (BACID) TABS Take 1 tablet by mouth daily.    [provider]  losartan (COZAAR) 50 MG tablet TK 1 T PO QD 12/31/14   [provider]  Multiple Vitamin (MULTIVITAMIN WITH MINERALS) TABS Take 1 tablet by mouth daily.    [provider]  psyllium (METAMUCIL) 58.6 % powder Take 1 packet by mouth daily.    [provider]  Vilazodone HCl (VIIBRYD) 10 MG TABS TAKE 1 TABLET BY MOUTH DAILY OR AS DIRECTED BY DOCTOR 01/06/17   Dohmeier, Asencion Partridge, MD    Family History Family History  Problem Relation Age of Onset  . Stomach cancer Father   . Sick sinus syndrome Father   . Colon cancer Neg Hx     Social History Social History  Substance Use Topics  . Smoking status: Current Every Day Smoker    Packs/day: 1.00    Years: 20.00    Types: Cigarettes  . Smokeless tobacco: Never Used  . Alcohol use 4.8 oz/week    8 Cans of beer per week     Allergies   Codeine  Review of Systems Review of Systems ROS: Statement: All systems negative except as marked or noted in the HPI; Constitutional: Negative for fever and chills. ; ; Eyes: Negative for eye pain, redness and discharge. ; ; ENMT: Negative for ear pain, hoarseness, nasal congestion, sinus pressure and sore throat. ; ; Cardiovascular: Negative for chest pain, palpitations, diaphoresis, dyspnea and peripheral edema. ; ; Respiratory: Negative for cough, wheezing and stridor. ; ; Gastrointestinal: Negative for nausea, vomiting, diarrhea, abdominal pain, blood in stool, hematemesis, jaundice and rectal bleeding. . ; ; Genitourinary: Negative for dysuria, flank pain and hematuria. ; ; Musculoskeletal: +left upper arm pain. Negative for back pain and neck pain. Negative for deformity.; ; Skin: Negative for pruritus, rash,  abrasions, blisters, bruising and skin lesion.; ; Neuro: Negative for headache, lightheadedness and neck stiffness. Negative for weakness, altered level of consciousness, altered mental status, extremity weakness, paresthesias, involuntary movement, seizure and syncope.      Physical Exam Updated Vital Signs BP 131/81   Pulse 77   Temp 97.7 F (36.5 C) (Oral)   Resp 20   Ht 5\' 6"  (1.676 m)   Wt 68 kg (150 lb)   SpO2 98%   BMI 24.21 kg/m   Physical Exam 1815: Physical examination:  Nursing notes reviewed; Vital signs and O2 SAT reviewed;  Constitutional: Well developed, Well nourished, Well hydrated, In no acute distress; Head:  Normocephalic, atraumatic; Eyes: EOMI, PERRL, No scleral icterus; ENMT: Mouth and pharynx normal, Mucous membranes moist; Neck: Supple, Full range of motion, No lymphadenopathy; Cardiovascular: Regular rate and rhythm, No gallop; Respiratory: Breath sounds clear & equal bilaterally, No wheezes.  Speaking full sentences with ease, Normal respiratory effort/excursion; Chest: Nontender, Left clavicle NT to palp. Movement normal; Abdomen: Soft, Nontender, Nondistended, Normal bowel sounds; Genitourinary: No CVA tenderness; Spine:  No midline CS, TS, LS tenderness.;; Extremities: Pulses normal, +TTP left proximal humeral area with localized edema and decreased ROM left shoulder due to pain, no ecchymosis, no abrasions. No obvious shoulder deformity. LUE muscles compartments soft. NT left elbow/wrist/hand. No open wounds. Strong radial pulse, NMS intact left hand; Neuro: AA&Ox3, Major CN grossly intact.  Speech clear. +decreased ROM left shoulder, otherwise no gross focal motor deficits in extremities. Climbs on and off stretcher easily by herself. Gait steady.; Skin: Color normal, Warm, Dry.   ED Treatments / Results  Labs (all labs ordered are listed, but only abnormal results are displayed)   EKG  EKG Interpretation None        Radiology   Procedures Procedures (including critical care time)  Medications Ordered in ED Medications - No data to display   Initial Impression / Assessment and Plan / ED Course  I have reviewed the triage vital signs and the nursing notes.  Pertinent labs & imaging results that were available during my care of the patient were reviewed by me and considered in my medical decision making (see chart for details).  MDM Reviewed: previous chart, nursing note and vitals Interpretation: x-ray   Dg Wrist Complete Left Result Date: 02/06/2017 CLINICAL DATA:  Left arm pain.  Status post fall. EXAM: LEFT WRIST - COMPLETE 3+ VIEW COMPARISON:  None. FINDINGS: No fracture or dislocation. Mild osteoarthritis of the scaphotrapeziotrapezoid joint. Moderate-severe osteoarthritis of the first [place] joint. No focal soft tissue abnormality. IMPRESSION: No acute osseous injury of the left wrist. Electronically Signed   By: Kathreen Devoid   On: 02/06/2017 19:12   Dg Shoulder Left Result Date: 02/06/2017 CLINICAL DATA:  60  y/o  F; fall with pain. EXAM: LEFT HUMERUS - 2+ VIEW; LEFT SHOULDER - 2+ VIEW COMPARISON:  None. FINDINGS: Left humerus: Comminuted acute oblique fracture of the left humerus proximal diaphysis with 1/2 shaft width lateral displacement of the proximal fracture component. Elbow joint appears maintained. Left shoulder: Comminuted acute oblique fracture of the left proximal humerus diaphysis with 1/2 shaft's width displacement. Shoulder joint appears maintained. IMPRESSION: Comminuted acute oblique fracture of left proximal humerus diaphysis with 1/2 shaft's width displacement of major fracture components. Shoulder and elbow joints appear maintained. Electronically Signed   By: Kristine Garbe M.D.   On: 02/06/2017 19:12   Dg Humerus Left Result Date: 02/06/2017 CLINICAL DATA:  61 y/o  F; fall with pain. EXAM: LEFT HUMERUS - 2+ VIEW; LEFT SHOULDER - 2+ VIEW COMPARISON:  None.  FINDINGS: Left humerus: Comminuted acute oblique fracture of the left humerus proximal diaphysis with 1/2 shaft width lateral displacement of the proximal fracture component. Elbow joint appears maintained. Left shoulder: Comminuted acute oblique fracture of the left proximal humerus diaphysis with 1/2 shaft's width displacement. Shoulder joint appears maintained. IMPRESSION: Comminuted acute oblique fracture of left proximal humerus diaphysis with 1/2 shaft's width displacement of major fracture components. Shoulder and elbow joints appear maintained. Electronically Signed   By: Kristine Garbe M.D.   On: 02/06/2017 19:12    1945:  Sling applied. T/C to Ortho Dr. Aline Brochure, case discussed, including:  HPI, pertinent PM/SHx, VS/PE, dx testing, ED course and treatment:  He has viewed the images, requests sling/pain control, pt may or may not need OR repair, have pt f/u office to further discuss. Dx and testing, and d/w Ortho MD, d/w pt and family.  Questions answered.  Verb understanding, agreeable to d/c home with outpt f/u.   Final Clinical Impressions(s) / ED Diagnoses   Final diagnoses:  None    New Prescriptions New Prescriptions   No medications on file     Francine Graven, DO 02/08/17 1355

## 2017-02-06 NOTE — ED Notes (Signed)
Ice pack provided to pt

## 2017-02-06 NOTE — ED Triage Notes (Signed)
History of MS, fell at home, pain in left arm

## 2017-02-06 NOTE — Discharge Instructions (Signed)
Take the prescriptions as directed.  Apply ice to the area(s) of discomfort, for 15 minutes at a time, several times per day for the next few days.  Do not fall asleep on an ice pack.  Wear the sling until you are seen in follow up.  Call the Orthopedic doctor on Monday to schedule a follow up appointment next week.  Return to the Emergency Department immediately if worsening.

## 2017-02-07 ENCOUNTER — Encounter (HOSPITAL_COMMUNITY): Payer: Self-pay | Admitting: *Deleted

## 2017-02-07 ENCOUNTER — Emergency Department (HOSPITAL_COMMUNITY)
Admission: EM | Admit: 2017-02-07 | Discharge: 2017-02-07 | Disposition: A | Discharge status: Home / Self Care | Payer: Medicare Other | Attending: Emergency Medicine | Admitting: Emergency Medicine

## 2017-02-07 DIAGNOSIS — Z79899 Other long term (current) drug therapy: Secondary | ICD-10-CM | POA: Diagnosis not present

## 2017-02-07 DIAGNOSIS — S42352D Displaced comminuted fracture of shaft of humerus, left arm, subsequent encounter for fracture with routine healing: Secondary | ICD-10-CM | POA: Diagnosis not present

## 2017-02-07 DIAGNOSIS — W1830XD Fall on same level, unspecified, subsequent encounter: Secondary | ICD-10-CM | POA: Diagnosis not present

## 2017-02-07 DIAGNOSIS — F17210 Nicotine dependence, cigarettes, uncomplicated: Secondary | ICD-10-CM | POA: Diagnosis not present

## 2017-02-07 DIAGNOSIS — S4982XD Other specified injuries of left shoulder and upper arm, subsequent encounter: Secondary | ICD-10-CM | POA: Diagnosis present

## 2017-02-07 MED ORDER — HYDROMORPHONE HCL 1 MG/ML IJ SOLN
2.0000 mg | Freq: Once | INTRAMUSCULAR | Status: AC
  Administered 2017-02-07: 2 mg via INTRAMUSCULAR
  Filled 2017-02-07: qty 2

## 2017-02-07 MED ORDER — ONDANSETRON 4 MG PO TBDP
8.0000 mg | ORAL_TABLET | Freq: Once | ORAL | Status: AC
  Administered 2017-02-07: 8 mg via ORAL
  Filled 2017-02-07: qty 2

## 2017-02-07 NOTE — Discharge Instructions (Signed)
Continue pain medicine. Use cold therapy as discussed. Keep shoulder immobilizer in place and area elevated. Return as discussed his severe swelling, numbness, or weakness of hand or arm distal to injury.

## 2017-02-07 NOTE — ED Triage Notes (Signed)
The pt fell last pm  While reaching for something  She has pain in her lt humerus  She was seen at [facility] last pm for the same placed in an arm sling  Bruises from just above the lt elbow and down  She has an arm sling that appears to be too large

## 2017-02-07 NOTE — ED Provider Notes (Signed)
[facility] DEPT Provider Note   CSN: [id number] Arrival date & time: 02/07/17  1444     History   Chief Complaint Chief Complaint  Patient presents with  . Arm Injury    HPI [name] is a 61 y.o. female.  HPI This is a 61 year old female history of multiple sclerosis who lost her balance last night and fell striking her left arm. She was seen at any panel and an x-Jarquis Walker of her arm shows a humeral shaft fracture. She was placed in a shoulder immobilizer and given a prescription for Vicodin and Flexeril. She is continuing to have significant pain. Plans for her to follow-up with Dr. Aline Brochure on Monday. She denies any other injury. She is not having numbness or tingling in her arm or hand. She is moving her hand without difficulty. Past Medical History:  Diagnosis Date  . Colon polyps   . GERD (gastroesophageal reflux disease)   . Hiatal hernia   . MS (multiple sclerosis) ([place]) 12/09/2013  . Multiple sclerosis [facility])     Patient Active Problem List   Diagnosis Date Noted  . Insomnia, controlled 01/06/2017  . MS (multiple sclerosis) ([place]) 12/09/2013    Past Surgical History:  Procedure Laterality Date  . COLONOSCOPY    . COLONOSCOPY N/A 09/16/2012   Procedure: COLONOSCOPY;  Surgeon: Rogene Houston, MD;  [facility];  Service: Endoscopy;  Laterality: N/A;  1030-rescheduled to 9:30 Ann notified pt  . DILATION AND CURETTAGE OF UTERUS      OB History    No data available       Home Medications    Prior to Admission medications   Medication Sig Start Date End Date Taking? Authorizing Provider  Artificial Tear Solution (SOOTHE XP OP) Place 1 drop into both eyes 3 (three) times daily.   Yes [provider]  cholecalciferol (VITAMIN D) 400 units TABS tablet Take 400 Units by mouth daily.   Yes [provider]  Cyanocobalamin (VITAMIN B-12 SL) Place 1 tablet under the tongue daily.   Yes [provider]  diazepam (VALIUM) 5 MG  tablet Take 2.5 mg by mouth at bedtime as needed (sleep).    Yes [provider]  esomeprazole (NEXIUM) 40 MG capsule Take 1 capsule (40 mg total) by mouth daily at 12 noon. Patient taking differently: Take 40 mg by mouth daily.  01/06/17  Yes Dohmeier, Asencion Partridge, MD  Glatiramer Acetate (COPAXONE) 40 MG/ML SOSY Inject 40 mg into the skin as directed. 3 times a week Patient taking differently: Inject 40 mg into the skin every 3 (three) days.  11/14/16  Yes Dohmeier, Asencion Partridge, MD  HYDROcodone-acetaminophen (NORCO/VICODIN) 5-325 MG tablet 1 or 2 tabs PO q6 hours prn pain Patient taking differently: Take 1-2 tablets by mouth every 6 (six) hours as needed (pain).  02/06/17  Yes Francine Graven, DO  ibuprofen (ADVIL,MOTRIN) 200 MG tablet Take 600 mg by mouth every 6 (six) hours as needed (pain).   Yes [provider]  losartan (COZAAR) 50 MG tablet Take 75 mg by mouth daily.   Yes [provider]  methocarbamol (ROBAXIN) 500 MG tablet Take 2 tablets (1,000 mg total) by mouth 4 (four) times daily as needed for muscle spasms (muscle spasm/pain). Patient taking differently: Take 1,000 mg by mouth 4 (four) times daily as needed for muscle spasms.  02/06/17  Yes Francine Graven, DO  Multiple Vitamin (MULTIVITAMIN WITH MINERALS) TABS Take 1 tablet by mouth daily.   Yes [provider]  Probiotic Product (PROBIOTIC PO) Take 1 tablet by mouth daily.   Yes [provider]  Vilazodone HCl (VIIBRYD) 10 MG TABS TAKE 1 TABLET BY MOUTH DAILY OR AS DIRECTED BY DOCTOR Patient taking differently: Take 5 mg by mouth at bedtime.  01/06/17  Yes Dohmeier, Asencion Partridge, MD    Family History Family History  Problem Relation Age of Onset  . Stomach cancer Father   . Sick sinus syndrome Father   . Colon cancer Neg Hx     Social History Social History  Substance Use Topics  . Smoking status: Current Every Day Smoker    Packs/day: 1.00    Years: 20.00    Types: Cigarettes  . Smokeless  tobacco: Never Used  . Alcohol use 4.8 oz/week    8 Cans of beer per week     Allergies   Codeine   Review of Systems Review of Systems  All other systems reviewed and are negative.    Physical Exam Updated Vital Signs BP 132/78   Pulse 79   Temp 98.4 F (36.9 C) (Oral)   Resp 16   Ht 1.676 m (5\' 6" )   Wt 68 kg (150 lb)   SpO2 96%   BMI 24.21 kg/m   Physical Exam  Constitutional: She appears well-developed and well-nourished.  HENT:  Head: Normocephalic and atraumatic.  Eyes: Pupils are equal, round, and reactive to light.  Neck: Normal range of motion.  Cardiovascular: Normal rate and regular rhythm.   Pulmonary/Chest: Effort normal and breath sounds normal.  Abdominal: Soft.  Musculoskeletal:  Left upper extremity with skin discoloration, ttp Shoulder, elbow nttp Forearm with good wrist and hand movement Radial pulse, forearm hand sensation intact Left upper arm soft - no evidence of compartment syndrome  Nursing note and vitals reviewed.    ED Treatments / Results  Labs (all labs ordered are listed, but only abnormal results are displayed) Labs Reviewed - No data to display  EKG  EKG Interpretation None       Radiology Dg Wrist Complete Left  Result Date: 02/06/2017 CLINICAL DATA:  Left arm pain.  Status post fall. EXAM: LEFT WRIST - COMPLETE 3+ VIEW COMPARISON:  None. FINDINGS: No fracture or dislocation. Mild osteoarthritis of the scaphotrapeziotrapezoid joint. Moderate-severe osteoarthritis of the first [place] joint. No focal soft tissue abnormality. IMPRESSION: No acute osseous injury of the left wrist. Electronically Signed   By: Kathreen Devoid   On: 02/06/2017 19:12   Dg Shoulder Left  Result Date: 02/06/2017 CLINICAL DATA:  61 y/o  F; fall with pain. EXAM: LEFT HUMERUS - 2+ VIEW; LEFT SHOULDER - 2+ VIEW COMPARISON:  None. FINDINGS: Left humerus: Comminuted acute oblique fracture of the left humerus proximal diaphysis with 1/2 shaft width lateral  displacement of the proximal fracture component. Elbow joint appears maintained. Left shoulder: Comminuted acute oblique fracture of the left proximal humerus diaphysis with 1/2 shaft's width displacement. Shoulder joint appears maintained. IMPRESSION: Comminuted acute oblique fracture of left proximal humerus diaphysis with 1/2 shaft's width displacement of major fracture components. Shoulder and elbow joints appear maintained. Electronically Signed   By: Kristine Garbe M.D.   On: 02/06/2017 19:12   Dg Humerus Left  Result Date: 02/06/2017 CLINICAL DATA:  61 y/o  F; fall with pain. EXAM: LEFT HUMERUS - 2+ VIEW; LEFT SHOULDER - 2+ VIEW COMPARISON:  None. FINDINGS: Left humerus: Comminuted acute oblique fracture of the left humerus proximal diaphysis with 1/2 shaft width lateral displacement of the proximal  fracture component. Elbow joint appears maintained. Left shoulder: Comminuted acute oblique fracture of the left proximal humerus diaphysis with 1/2 shaft's width displacement. Shoulder joint appears maintained. IMPRESSION: Comminuted acute oblique fracture of left proximal humerus diaphysis with 1/2 shaft's width displacement of major fracture components. Shoulder and elbow joints appear maintained. Electronically Signed   By: Kristine Garbe M.D.   On: 02/06/2017 19:12    Procedures Procedures (including critical care time)  Medications Ordered in ED Medications  HYDROmorphone (DILAUDID) injection 2 mg (2 mg Intramuscular Given 02/07/17 2043)     Initial Impression / Assessment and Plan / ED Course  I have reviewed the triage vital signs and the nursing notes.  Pertinent labs & imaging results that were available during my care of the patient were reviewed by me and considered in my medical decision making (see chart for details).     Reviewed x-Venson Ferencz with Dr. Delfino Lovett. Advises patient can continue in sling. She is given some additional pain medicine IM here. She's had some  nausea and vomiting. She has been tolerating the by mouth medicine without difficulty. She's advised regarding elevation and ice. I also discussed return precautions such as severe swelling, numbness or weakness in her hand. She will follow-up with Dr. Aline Brochure  Final Clinical Impressions(s) / ED Diagnoses   Final diagnoses:  Closed displaced comminuted fracture of shaft of left humerus with routine healing, subsequent encounter    New Prescriptions New Prescriptions   No medications on file     Pattricia Boss, MD 02/07/17 2245

## 2017-02-09 ENCOUNTER — Ambulatory Visit (INDEPENDENT_AMBULATORY_CARE_PROVIDER_SITE_OTHER): Payer: Medicare Other | Admitting: Orthopedic Surgery

## 2017-02-09 DIAGNOSIS — S42295A Other nondisplaced fracture of upper end of left humerus, initial encounter for closed fracture: Secondary | ICD-10-CM

## 2017-02-09 MED ORDER — HYDROCODONE-ACETAMINOPHEN 5-325 MG PO TABS: 1.0000 | ORAL_TABLET | ORAL | 0 refills | Status: DC | PRN

## 2017-02-09 MED ORDER — METHOCARBAMOL 500 MG PO TABS: 500.0000 mg | ORAL_TABLET | Freq: Four times a day (QID) | ORAL | 5 refills | Status: DC | PRN

## 2017-02-09 NOTE — Progress Notes (Signed)
New patient evaluation  Chief complaint left shoulder pain  This is a 61 year old female who was injured on August 17 secondary to a fall she fractured her proximal humerus. She went to the local ER she was placed in a sling she subsequently went to the common ER for placement of another immobilizer presents for evaluation and treatment of a left proximal humerus fracture  She complains of mild dull constant upper left arm pain with no numbness or tingling deltoid or radial nerve  Review of systems she does have multiple sclerosis But does not have any chest pain or shortness of breath  Past Medical History:  Diagnosis Date  . Colon polyps   . GERD (gastroesophageal reflux disease)   . Hiatal hernia   . MS (multiple sclerosis) ([place]) 12/09/2013  . Multiple sclerosis ([place])     Past Surgical History:  Procedure Laterality Date  . COLONOSCOPY    . COLONOSCOPY N/A 09/16/2012   Procedure: COLONOSCOPY;  Surgeon: Rogene Houston, MD;  [facility];  Service: Endoscopy;  Laterality: N/A;  1030-rescheduled to 9:30 Ann notified pt  . DILATION AND CURETTAGE OF UTERUS      Her overall appearance is normal her body habitus is small she is oriented 3 her mood is pleasant her affect is flat her gait is normal  Her left shoulder is tender her arm is swollen neck and miotic she has no movement of the left shoulder normal elbow wrist and hand. Elbow wrist and hand stable. Motor exam normal including all muscles of the radial nerve sensation remains normal pulses are good lymph nodes are negative  X-rays show a proximal humerus fracture shaft comminuted I have apex medial angulation measuring 38  I placed her in a coaptation splint with an x-ray scheduled for 2-3 weeks to be placed in a fracture cuff  Meds ordered this encounter  Medications  . methocarbamol (ROBAXIN) 500 MG tablet    Sig: Take 1 tablet (500 mg total) by mouth every 6 (six) hours as needed for muscle spasms (muscle  spasm/pain).    Dispense:  56 tablet    Refill:  5  . HYDROcodone-acetaminophen (NORCO/VICODIN) 5-325 MG tablet    Sig: Take 1 tablet by mouth every 4 (four) hours as needed for moderate pain. 1 or 2 tabs PO q6 hours prn pain    Dispense:  42 tablet    Refill:  0

## 2017-02-09 NOTE — Addendum Note (Signed)
Addended by: Carole Civil on: 02/09/2017 03:16 PM   Modules accepted: Orders

## 2017-02-10 ENCOUNTER — Other Ambulatory Visit: Payer: Self-pay | Admitting: *Deleted

## 2017-02-10 ENCOUNTER — Ambulatory Visit: Payer: Medicare Other | Admitting: Orthopaedic Surgery

## 2017-02-10 ENCOUNTER — Telehealth: Payer: Self-pay | Admitting: Orthopedic Surgery

## 2017-02-10 DIAGNOSIS — S42295A Other nondisplaced fracture of upper end of left humerus, initial encounter for closed fracture: Secondary | ICD-10-CM

## 2017-02-10 MED ORDER — METHOCARBAMOL 500 MG PO TABS: 500.0000 mg | ORAL_TABLET | Freq: Four times a day (QID) | ORAL | 5 refills | Status: DC | PRN

## 2017-02-10 NOTE — Telephone Encounter (Signed)
Made patient an appointment for tomorrow to see Dr. Luna Glasgow

## 2017-02-10 NOTE — Telephone Encounter (Signed)
Patient states that her prescription methocarbamol (ROBAXIN) 500 MG tablets  was not received at [facility], [place].  Please advise.

## 2017-02-10 NOTE — Telephone Encounter (Signed)
Routing to nurse Tammy to address with patient

## 2017-02-10 NOTE — Telephone Encounter (Signed)
First off, pt states she did get her medication and wanted to thank you for that.  Secondly she states that her splint that Dr. Aline Brochure put on her yesterday is just so heavy.  She continues to wear the sling at all times but whenever she gets up, it feels like that splint weighs 20-25 pounds.  She said she is actually having to hold her head at at an angle because of it.  She would like to get your input as to if this is normal.  Please advise this patient.  Thanks

## 2017-02-10 NOTE — Telephone Encounter (Signed)
Resent prescription

## 2017-02-11 ENCOUNTER — Ambulatory Visit (INDEPENDENT_AMBULATORY_CARE_PROVIDER_SITE_OTHER): Payer: Medicare Other

## 2017-02-11 ENCOUNTER — Ambulatory Visit (INDEPENDENT_AMBULATORY_CARE_PROVIDER_SITE_OTHER): Payer: Medicare Other | Admitting: Orthopaedic Surgery

## 2017-02-11 DIAGNOSIS — S42209A Unspecified fracture of upper end of unspecified humerus, initial encounter for closed fracture: Secondary | ICD-10-CM

## 2017-02-11 DIAGNOSIS — S42295D Other nondisplaced fracture of upper end of left humerus, subsequent encounter for fracture with routine healing: Secondary | ICD-10-CM

## 2017-02-11 NOTE — Patient Instructions (Signed)
Steps to Quit Smoking Smoking tobacco can be bad for your health. It can also affect almost every organ in your body. Smoking puts you and people around you at risk for many serious Tennyson Wacha-lasting (chronic) diseases. Quitting smoking is hard, but it is one of the best things that you can do for your health. It is never too late to quit. What are the benefits of quitting smoking? When you quit smoking, you lower your risk for getting serious diseases and conditions. They can include:  Lung cancer or lung disease.  Heart disease.  Stroke.  Heart attack.  Not being able to have children (infertility).  Weak bones (osteoporosis) and broken bones (fractures).  If you have coughing, wheezing, and shortness of breath, those symptoms may get better when you quit. You may also get sick less often. If you are pregnant, quitting smoking can help to lower your chances of having a baby of low birth weight. What can I do to help me quit smoking? Talk with your doctor about what can help you quit smoking. Some things you can do (strategies) include:  Quitting smoking totally, instead of slowly cutting back how much you smoke over a period of time.  Going to in-person counseling. You are more likely to quit if you go to many counseling sessions.  Using resources and support systems, such as: ? Online chats with a counselor. ? Phone quitlines. ? Printed self-help materials. ? Support groups or group counseling. ? Text messaging programs. ? Mobile phone apps or applications.  Taking medicines. Some of these medicines may have nicotine in them. If you are pregnant or breastfeeding, do not take any medicines to quit smoking unless your doctor says it is okay. Talk with your doctor about counseling or other things that can help you.  Talk with your doctor about using more than one strategy at the same time, such as taking medicines while you are also going to in-person counseling. This can help make  quitting easier. What things can I do to make it easier to quit? Quitting smoking might feel very hard at first, but there is a lot that you can do to make it easier. Take these steps:  Talk to your family and friends. Ask them to support and encourage you.  Call phone quitlines, reach out to support groups, or work with a counselor.  Ask people who smoke to not smoke around you.  Avoid places that make you want (trigger) to smoke, such as: ? Bars. ? Parties. ? Smoke-break areas at work.  Spend time with people who do not smoke.  Lower the stress in your life. Stress can make you want to smoke. Try these things to help your stress: ? Getting regular exercise. ? Deep-breathing exercises. ? Yoga. ? Meditating. ? Doing a body scan. To do this, close your eyes, focus on one area of your body at a time from head to toe, and notice which parts of your body are tense. Try to relax the muscles in those areas.  Download or buy apps on your mobile phone or tablet that can help you stick to your quit plan. There are many free apps, such as QuitGuide from the CDC (Centers for Disease Control and Prevention). You can find more support from smokefree.gov and other websites.  This information is not intended to replace advice given to you by your health care provider. Make sure you discuss any questions you have with your health care provider. Document Released: 04/05/2009 Document   Revised: 02/05/2016 Document Reviewed: 10/24/2014 Elsevier Interactive Patient Education  2018 Elsevier Inc.  

## 2017-02-11 NOTE — Progress Notes (Signed)
CC:  My arm hurts more.  It really hurts.  She is a patient of Dr. Aline Brochure.  He is on vacation this week.  She has proximal third of humerus fracture and is in a co adaption splint.    NV intact.  Splint is in good position.  X-rays were done, reported separately.  She has significant displacement of fracture of proximal humerus with comminution.  I feel she needs surgery.  I will arrange her to be seen at [facility].  She understands and agrees.  I have shown her the x-rays.  Call if any problem.  Precautions discussed.   Electronically Signed Sanjuana Kava, MD 8/22/201811:03 AM

## 2017-02-12 ENCOUNTER — Ambulatory Visit (INDEPENDENT_AMBULATORY_CARE_PROVIDER_SITE_OTHER): Payer: Medicare Other | Admitting: Orthopedic Surgery

## 2017-02-12 ENCOUNTER — Other Ambulatory Visit (INDEPENDENT_AMBULATORY_CARE_PROVIDER_SITE_OTHER): Payer: Self-pay | Admitting: Orthopedic Surgery

## 2017-02-12 ENCOUNTER — Encounter (INDEPENDENT_AMBULATORY_CARE_PROVIDER_SITE_OTHER): Payer: Self-pay | Admitting: Orthopedic Surgery

## 2017-02-12 DIAGNOSIS — S42202D Unspecified fracture of upper end of left humerus, subsequent encounter for fracture with routine healing: Secondary | ICD-10-CM | POA: Diagnosis not present

## 2017-02-12 DIAGNOSIS — S42202A Unspecified fracture of upper end of left humerus, initial encounter for closed fracture: Secondary | ICD-10-CM

## 2017-02-12 NOTE — Progress Notes (Signed)
Office Visit Note   Patient: ANELLY SAMARIN           Date of Birth: 24-May-1956           MRN: [id number] Visit Date: 02/12/2017 Requested by: Sharilyn Sites, [address] [place], [place] [address] PCP: Sharilyn Sites, MD  Subjective: Chief Complaint  Patient presents with  . HUMERUS INJURY    LEFT HUMERAL SHAFT FX    HPI: Catharina is a 61 year old patient with left shoulder pain.  Date of injury 02/06/2017.  She fell at home.  She fell into a brick wall hitting her left arm.  She denies any other orthopedic complaints.  She was seen by Dr. Luna Glasgow yesterday who advised her that she should seek surgical intervention.  She is right-hand dominant and retired.  She enjoys gardening and fishing.  She does have a history of multiple sclerosis for the past 12 years.  She takes shots for this problem does not have a known history of osteoporosis or osteopenia she is a smoker.              ROS: All systems reviewed are negative as they relate to the chief complaint within the history of present illness.  Patient denies  fevers or chills.   Assessment & Plan: Visit Diagnoses: No diagnosis found.  Plan: Impression is displaced left proximal humerus fracture.  Plan is open reduction internal fixation with bridge plate technique and bone grafting.  I think she has a reasonable chance to do well with this but it will be a 4-6 month recovery in terms of getting her strength and range of motion back.  The risks and benefits are discussed including not limited to infection or vessel damage incomplete or delayed healing along with potential need for further surgery.  Allowing this to heal on its own is considered but if it does not heal then going back to treat this nonunion would be very difficult due to the comminution and angulation of the fragments.  I think her best bet would be surgical fixation now which should be stable enough to allow for some early motion but rigid enough to allow for healing.   Bone grafting will be required.  Patient stands the risks and benefits of surgery.  All questions answered  Follow-Up Instructions: No Follow-up on file.   Orders:  No orders of the defined types were placed in this encounter.  No orders of the defined types were placed in this encounter.     Procedures: No procedures performed   Clinical Data: No additional findings.  Objective: Vital Signs: There were no vitals taken for this visit.  Physical Exam:   Constitutional: Patient appears well-developed HEENT:  Head: Normocephalic Eyes:EOM are normal Neck: Normal range of motion Cardiovascular: Normal rate Pulmonary/chest: Effort normal Neurologic: Patient is alert Skin: Skin is warm Psychiatric: Patient has normal mood and affect    Ortho Exam: Orthopedic exam demonstrates intact EPL FPL interosseous function in that left hand with palpable radial pulse.  Swelling is present in the left proximal humerus region but it has improved according to the patient.  She has good ankle knee and hip range of motion and no issues with her right arm.  Elbow is nontender in her splint.  Specialty Comments:  No specialty comments available.  Imaging: No results found.   PMFS History: Patient Active Problem List   Diagnosis Date Noted  . Insomnia, controlled 01/06/2017  . MS (multiple sclerosis) ([place]) 12/09/2013  Past Medical History:  Diagnosis Date  . Colon polyps   . GERD (gastroesophageal reflux disease)   . Hiatal hernia   . MS (multiple sclerosis) ([place]) 12/09/2013  . Multiple sclerosis ([place])     Family History  Problem Relation Age of Onset  . Stomach cancer Father   . Sick sinus syndrome Father   . Colon cancer Neg Hx     Past Surgical History:  Procedure Laterality Date  . COLONOSCOPY    . COLONOSCOPY N/A 09/16/2012   Procedure: COLONOSCOPY;  Surgeon: Rogene Houston, MD;  [facility];  Service: Endoscopy;  Laterality: N/A;  1030-rescheduled to 9:30  Ann notified pt  . DILATION AND CURETTAGE OF UTERUS     Social History   Occupational History  .  Disabled   Social History Main Topics  . Smoking status: Current Every Day Smoker    Packs/day: 1.00    Years: 20.00    Types: Cigarettes  . Smokeless tobacco: Never Used  . Alcohol use 4.8 oz/week    8 Cans of beer per week  . Drug use: No  . Sexual activity: Not on file

## 2017-02-13 ENCOUNTER — Encounter (HOSPITAL_COMMUNITY): Payer: Self-pay

## 2017-02-13 ENCOUNTER — Encounter (HOSPITAL_COMMUNITY)
Admission: RE | Admit: 2017-02-13 | Discharge: 2017-02-13 | Disposition: A | Discharge status: Home / Self Care | Payer: Medicare Other | Source: Ambulatory Visit | Attending: Orthopedic Surgery | Admitting: Orthopedic Surgery

## 2017-02-13 ENCOUNTER — Telehealth (INDEPENDENT_AMBULATORY_CARE_PROVIDER_SITE_OTHER): Payer: Self-pay | Admitting: Orthopedic Surgery

## 2017-02-13 DIAGNOSIS — Z01812 Encounter for preprocedural laboratory examination: Secondary | ICD-10-CM | POA: Diagnosis present

## 2017-02-13 DIAGNOSIS — M25512 Pain in left shoulder: Secondary | ICD-10-CM | POA: Diagnosis not present

## 2017-02-13 HISTORY — DX: Unspecified convulsions: R56.9

## 2017-02-13 HISTORY — DX: Unspecified osteoarthritis, unspecified site: M19.90

## 2017-02-13 HISTORY — DX: Essential (primary) hypertension: I10

## 2017-02-13 LAB — BASIC METABOLIC PANEL
Anion gap: 11 (ref 5–15)
BUN: 5 mg/dL — ABNORMAL LOW (ref 6–20)
CALCIUM: 9.8 mg/dL (ref 8.9–10.3)
CHLORIDE: 104 mmol/L (ref 101–111)
CO2: 24 mmol/L (ref 22–32)
CREATININE: 0.68 mg/dL (ref 0.44–1.00)
Glucose, Bld: 96 mg/dL (ref 65–99)
Potassium: 3.6 mmol/L (ref 3.5–5.1)
SODIUM: 139 mmol/L (ref 135–145)

## 2017-02-13 LAB — CBC
HCT: 40.2 % (ref 36.0–46.0)
Hemoglobin: 13.9 g/dL (ref 12.0–15.0)
MCH: 34.2 pg — ABNORMAL HIGH (ref 26.0–34.0)
MCHC: 34.6 g/dL (ref 30.0–36.0)
MCV: 98.8 fL (ref 78.0–100.0)
PLATELETS: 279 10*3/uL (ref 150–400)
RBC: 4.07 MIL/uL (ref 3.87–5.11)
RDW: 12.8 % (ref 11.5–15.5)
WBC: 6.8 10*3/uL (ref 4.0–10.5)

## 2017-02-13 NOTE — Pre-Procedure Instructions (Signed)
[name]  02/13/2017    Your procedure is scheduled on Monday, August 27.  Report to [facility] Admitting at 8:00 AM                  Your surgery or procedure is scheduled for 10:00 AM   Call this number if you have problems the morning of surgery: [phone]- pre- op desk        Remember:  Do not eat food or drink liquids after midnight Sunday, August 26.  Take these medicines the morning of surgery with A SIP OF WATER :esomeprazole ([place]).                   May take  If needed: HYDROcodone-acetaminophen (NORCO/VICODIN), methocarbamol (ROBAXIN).        STOP taking Aspirin , Aspirin Products (Goody Powder, Excedrin Migraine), Ibuprofen (Advil), Naproxen (Aleve), Viatiams and Herbal Products (ie Fish Oil) Special instructions:   [facility]- Preparing For Surgery  Before surgery, you can play an important role. Because skin is not sterile, your skin needs to be as free of germs as possible. You can reduce the number of germs on your skin by washing with CHG (chlorahexidine gluconate) Soap before surgery.  CHG is an antiseptic cleaner which kills germs and bonds with the skin to continue killing germs even after washing.  Please do not use if you have an allergy to CHG or antibacterial soaps. If your skin becomes reddened/irritated stop using the CHG.  Do not shave (including legs and underarms) for at least 48 hours prior to first CHG shower. It is OK to shave your face.  Please follow these instructions carefully.   1. Shower the NIGHT BEFORE SURGERY and the MORNING OF SURGERY with CHG.   2. If you chose to wash your hair, wash your hair first as usual with your normal shampoo.  3. After you shampoo, rinse your hair and body thoroughly to remove the shampoo.   Wash your face and private area with the soap you use at home, then rinse.  4. Use CHG as you would any other liquid soap. You can apply CHG directly to the skin and wash gently with a scrungie or a clean  washcloth.   5. Apply the CHG Soap to your body ONLY FROM THE NECK DOWN.  Do not use on open wounds or open sores. Avoid contact with your eyes, ears, mouth and genitals (private parts). Wash genitals (private parts) with your normal soap.  6. Wash thoroughly, paying special attention to the area where your surgery will be performed.  7. Thoroughly rinse your body with warm water from the neck down.  8. DO NOT shower/wash with your normal soap after using and rinsing off the CHG Soap.  9. Pat yourself dry with a CLEAN TOWEL.   10. Wear CLEAN PAJAMAS   11. Place CLEAN SHEETS on your bed the night of your first shower and DO NOT SLEEP WITH PETS.  Day of Surgery: Shower as above Do not apply any deodorants/lotions, powders or perfumes.. Please wear clean clothes to the hospital/surgery center.    Do not wear jewelry, make-up or nail polish.  Do not shave 48 hours prior to surgery.  Men may shave face and neck.  Do not bring valuables to the hospital.  [facility] is not responsible for any belongings or valuables.  Contacts, dentures or bridgework may not be worn into surgery.  Leave your suitcase in  the car.  After surgery it may be brought to your room.  For patients admitted to the hospital, discharge time will be determined by your treatment team.  Patients discharged the day of surgery will not be allowed to drive home.   Name and phone number of your driver: -  Please read over the following fact sheets that you were given: Pain Booklet,Coughing and Deep Breathing, Surgical Site Infections.

## 2017-02-13 NOTE — Telephone Encounter (Signed)
Hospital called requesting orders for this pt to be signed. Being seen today for PST at 1pm.

## 2017-02-16 ENCOUNTER — Encounter (HOSPITAL_COMMUNITY): Payer: Self-pay | Admitting: Certified Registered"

## 2017-02-16 ENCOUNTER — Ambulatory Visit (HOSPITAL_COMMUNITY): Payer: Medicare Other | Admitting: Certified Registered"

## 2017-02-16 ENCOUNTER — Ambulatory Visit (HOSPITAL_COMMUNITY): Payer: Medicare Other

## 2017-02-16 ENCOUNTER — Observation Stay (HOSPITAL_COMMUNITY): Payer: Medicare Other

## 2017-02-16 ENCOUNTER — Observation Stay (HOSPITAL_COMMUNITY)
Admission: RE | Admit: 2017-02-16 | Discharge: 2017-02-17 | Disposition: A | Discharge status: Home / Self Care | Payer: Medicare Other | Source: Ambulatory Visit | Attending: Orthopedic Surgery | Admitting: Orthopedic Surgery

## 2017-02-16 ENCOUNTER — Encounter (HOSPITAL_COMMUNITY)
Admission: RE | Disposition: A | Discharge status: Home / Self Care | Payer: Self-pay | Source: Ambulatory Visit | Attending: Orthopedic Surgery

## 2017-02-16 DIAGNOSIS — I10 Essential (primary) hypertension: Secondary | ICD-10-CM | POA: Insufficient documentation

## 2017-02-16 DIAGNOSIS — Z800 Family history of malignant neoplasm of digestive organs: Secondary | ICD-10-CM | POA: Insufficient documentation

## 2017-02-16 DIAGNOSIS — M1990 Unspecified osteoarthritis, unspecified site: Secondary | ICD-10-CM | POA: Insufficient documentation

## 2017-02-16 DIAGNOSIS — W19XXXA Unspecified fall, initial encounter: Secondary | ICD-10-CM | POA: Diagnosis not present

## 2017-02-16 DIAGNOSIS — K449 Diaphragmatic hernia without obstruction or gangrene: Secondary | ICD-10-CM | POA: Diagnosis not present

## 2017-02-16 DIAGNOSIS — Z8249 Family history of ischemic heart disease and other diseases of the circulatory system: Secondary | ICD-10-CM | POA: Diagnosis not present

## 2017-02-16 DIAGNOSIS — S42309A Unspecified fracture of shaft of humerus, unspecified arm, initial encounter for closed fracture: Secondary | ICD-10-CM

## 2017-02-16 DIAGNOSIS — K219 Gastro-esophageal reflux disease without esophagitis: Secondary | ICD-10-CM | POA: Diagnosis not present

## 2017-02-16 DIAGNOSIS — S42292A Other displaced fracture of upper end of left humerus, initial encounter for closed fracture: Secondary | ICD-10-CM | POA: Diagnosis not present

## 2017-02-16 DIAGNOSIS — Z888 Allergy status to other drugs, medicaments and biological substances status: Secondary | ICD-10-CM | POA: Diagnosis not present

## 2017-02-16 DIAGNOSIS — Z86010 Personal history of colonic polyps: Secondary | ICD-10-CM | POA: Insufficient documentation

## 2017-02-16 DIAGNOSIS — G35 Multiple sclerosis: Secondary | ICD-10-CM | POA: Diagnosis not present

## 2017-02-16 DIAGNOSIS — S42209A Unspecified fracture of upper end of unspecified humerus, initial encounter for closed fracture: Secondary | ICD-10-CM | POA: Diagnosis present

## 2017-02-16 DIAGNOSIS — S42202A Unspecified fracture of upper end of left humerus, initial encounter for closed fracture: Secondary | ICD-10-CM

## 2017-02-16 DIAGNOSIS — F17210 Nicotine dependence, cigarettes, uncomplicated: Secondary | ICD-10-CM | POA: Insufficient documentation

## 2017-02-16 DIAGNOSIS — Z885 Allergy status to narcotic agent status: Secondary | ICD-10-CM | POA: Insufficient documentation

## 2017-02-16 HISTORY — PX: ORIF HUMERUS FRACTURE: SHX2126

## 2017-02-16 SURGERY — OPEN REDUCTION INTERNAL FIXATION (ORIF) PROXIMAL HUMERUS FRACTURE
Anesthesia: General | Site: Arm Upper | Laterality: Left

## 2017-02-16 MED ORDER — MENTHOL 3 MG MT LOZG: 1.0000 | LOZENGE | OROMUCOSAL | Status: DC | PRN

## 2017-02-16 MED ORDER — PHENOL 1.4 % MT LIQD: 1.0000 | OROMUCOSAL | Status: DC | PRN

## 2017-02-16 MED ORDER — PANTOPRAZOLE SODIUM 40 MG PO TBEC
40.0000 mg | DELAYED_RELEASE_TABLET | Freq: Every day | ORAL | Status: DC
  Administered 2017-02-16: 40 mg via ORAL
  Filled 2017-02-16 (×2): qty 1

## 2017-02-16 MED ORDER — ONDANSETRON HCL 4 MG/2ML IJ SOLN: 4.0000 mg | Freq: Four times a day (QID) | INTRAMUSCULAR | Status: DC | PRN

## 2017-02-16 MED ORDER — FENTANYL CITRATE (PF) 250 MCG/5ML IJ SOLN
INTRAMUSCULAR | Status: AC
  Filled 2017-02-16: qty 5

## 2017-02-16 MED ORDER — FENTANYL CITRATE (PF) 100 MCG/2ML IJ SOLN
INTRAMUSCULAR | Status: AC
  Administered 2017-02-16: 50 ug via INTRAVENOUS
  Filled 2017-02-16: qty 2

## 2017-02-16 MED ORDER — DIAZEPAM 5 MG PO TABS: 2.5000 mg | ORAL_TABLET | Freq: Every evening | ORAL | Status: DC | PRN

## 2017-02-16 MED ORDER — CHOLECALCIFEROL 10 MCG (400 UNIT) PO TABS
400.0000 [IU] | ORAL_TABLET | Freq: Every day | ORAL | Status: DC
  Filled 2017-02-16 (×2): qty 1

## 2017-02-16 MED ORDER — POTASSIUM CHLORIDE IN NACL 20-0.9 MEQ/L-% IV SOLN: INTRAVENOUS | Status: AC

## 2017-02-16 MED ORDER — CEFAZOLIN SODIUM-DEXTROSE 2-4 GM/100ML-% IV SOLN
2.0000 g | INTRAVENOUS | Status: AC
  Administered 2017-02-16 (×2): 2 g via INTRAVENOUS
  Filled 2017-02-16: qty 100

## 2017-02-16 MED ORDER — GLATIRAMER ACETATE 40 MG/ML ~~LOC~~ SOSY: 40.0000 mg | PREFILLED_SYRINGE | SUBCUTANEOUS | Status: DC

## 2017-02-16 MED ORDER — METOCLOPRAMIDE HCL 5 MG PO TABS
5.0000 mg | ORAL_TABLET | Freq: Three times a day (TID) | ORAL | Status: DC | PRN
Start: 2017-02-16 — End: 2017-02-17

## 2017-02-16 MED ORDER — ACETAMINOPHEN 650 MG RE SUPP: 650.0000 mg | Freq: Four times a day (QID) | RECTAL | Status: DC | PRN

## 2017-02-16 MED ORDER — DEXAMETHASONE SODIUM PHOSPHATE 10 MG/ML IJ SOLN
INTRAMUSCULAR | Status: DC | PRN
  Administered 2017-02-16: 10 mg via INTRAVENOUS

## 2017-02-16 MED ORDER — ROPIVACAINE HCL 7.5 MG/ML IJ SOLN
INTRAMUSCULAR | Status: DC | PRN
  Administered 2017-02-16: 20 mL via PERINEURAL

## 2017-02-16 MED ORDER — CHLORHEXIDINE GLUCONATE 4 % EX LIQD: 60.0000 mL | Freq: Once | CUTANEOUS | Status: DC

## 2017-02-16 MED ORDER — GLYCOPYRROLATE 0.2 MG/ML IJ SOLN
INTRAMUSCULAR | Status: DC | PRN
  Administered 2017-02-16: 0.1 mg via INTRAVENOUS

## 2017-02-16 MED ORDER — METHOCARBAMOL 500 MG PO TABS
500.0000 mg | ORAL_TABLET | Freq: Four times a day (QID) | ORAL | Status: DC | PRN
  Administered 2017-02-16 – 2017-02-17 (×3): 500 mg via ORAL
  Filled 2017-02-16 (×3): qty 1

## 2017-02-16 MED ORDER — LACTATED RINGERS IV SOLN
INTRAVENOUS | Status: DC | PRN
  Administered 2017-02-16 (×2): via INTRAVENOUS

## 2017-02-16 MED ORDER — FENTANYL CITRATE (PF) 100 MCG/2ML IJ SOLN: 25.0000 ug | INTRAMUSCULAR | Status: DC | PRN

## 2017-02-16 MED ORDER — ACETAMINOPHEN 325 MG PO TABS: 650.0000 mg | ORAL_TABLET | Freq: Four times a day (QID) | ORAL | Status: DC | PRN

## 2017-02-16 MED ORDER — FENTANYL CITRATE (PF) 100 MCG/2ML IJ SOLN
50.0000 ug | Freq: Once | INTRAMUSCULAR | Status: AC
  Administered 2017-02-16: 50 ug via INTRAVENOUS

## 2017-02-16 MED ORDER — PROPOFOL 10 MG/ML IV BOLUS
INTRAVENOUS | Status: AC
  Filled 2017-02-16: qty 20

## 2017-02-16 MED ORDER — PHENYLEPHRINE HCL 10 MG/ML IJ SOLN
INTRAVENOUS | Status: DC | PRN
  Administered 2017-02-16: 20 ug/min via INTRAVENOUS

## 2017-02-16 MED ORDER — METOCLOPRAMIDE HCL 5 MG/ML IJ SOLN: 10.0000 mg | Freq: Once | INTRAMUSCULAR | Status: DC | PRN

## 2017-02-16 MED ORDER — HYDROCODONE-ACETAMINOPHEN 10-325 MG PO TABS
1.0000 | ORAL_TABLET | ORAL | Status: DC | PRN
  Administered 2017-02-16 – 2017-02-17 (×3): 1 via ORAL
  Filled 2017-02-16 (×3): qty 1

## 2017-02-16 MED ORDER — EPHEDRINE SULFATE 50 MG/ML IJ SOLN
INTRAMUSCULAR | Status: DC | PRN
  Administered 2017-02-16: 5 mg via INTRAVENOUS

## 2017-02-16 MED ORDER — SUGAMMADEX SODIUM 200 MG/2ML IV SOLN
INTRAVENOUS | Status: DC | PRN
  Administered 2017-02-16: 150 mg via INTRAVENOUS

## 2017-02-16 MED ORDER — ONDANSETRON HCL 4 MG/2ML IJ SOLN
INTRAMUSCULAR | Status: DC | PRN
  Administered 2017-02-16: 4 mg via INTRAVENOUS

## 2017-02-16 MED ORDER — MIDAZOLAM HCL 5 MG/5ML IJ SOLN
INTRAMUSCULAR | Status: DC | PRN
  Administered 2017-02-16: 2 mg via INTRAVENOUS

## 2017-02-16 MED ORDER — PROPOFOL 10 MG/ML IV BOLUS
INTRAVENOUS | Status: DC | PRN
  Administered 2017-02-16: 200 mg via INTRAVENOUS

## 2017-02-16 MED ORDER — LIDOCAINE HCL (CARDIAC) 20 MG/ML IV SOLN
INTRAVENOUS | Status: DC | PRN
  Administered 2017-02-16: 100 mg via INTRAVENOUS

## 2017-02-16 MED ORDER — LACTATED RINGERS IV SOLN: INTRAVENOUS | Status: DC

## 2017-02-16 MED ORDER — 0.9 % SODIUM CHLORIDE (POUR BTL) OPTIME
TOPICAL | Status: DC | PRN
  Administered 2017-02-16 (×5): 1000 mL

## 2017-02-16 MED ORDER — MEPERIDINE HCL 25 MG/ML IJ SOLN: 6.2500 mg | INTRAMUSCULAR | Status: DC | PRN

## 2017-02-16 MED ORDER — MIDAZOLAM HCL 2 MG/2ML IJ SOLN
INTRAMUSCULAR | Status: AC
  Administered 2017-02-16: 1 mg via INTRAVENOUS
  Filled 2017-02-16: qty 2

## 2017-02-16 MED ORDER — ROCURONIUM BROMIDE 100 MG/10ML IV SOLN
INTRAVENOUS | Status: DC | PRN
  Administered 2017-02-16: 20 mg via INTRAVENOUS
  Administered 2017-02-16: 10 mg via INTRAVENOUS
  Administered 2017-02-16: 20 mg via INTRAVENOUS
  Administered 2017-02-16: 10 mg via INTRAVENOUS
  Administered 2017-02-16: 60 mg via INTRAVENOUS
  Administered 2017-02-16: 20 mg via INTRAVENOUS

## 2017-02-16 MED ORDER — LOSARTAN POTASSIUM 50 MG PO TABS
75.0000 mg | ORAL_TABLET | Freq: Every day | ORAL | Status: DC
  Administered 2017-02-16: 75 mg via ORAL
  Filled 2017-02-16 (×2): qty 2

## 2017-02-16 MED ORDER — VILAZODONE HCL 20 MG PO TABS
10.0000 mg | ORAL_TABLET | Freq: Every day | ORAL | Status: DC
  Administered 2017-02-16: 10 mg via ORAL
  Filled 2017-02-16: qty 0.5

## 2017-02-16 MED ORDER — METOCLOPRAMIDE HCL 5 MG/ML IJ SOLN: 5.0000 mg | Freq: Three times a day (TID) | INTRAMUSCULAR | Status: DC | PRN

## 2017-02-16 MED ORDER — ONDANSETRON HCL 4 MG PO TABS: 4.0000 mg | ORAL_TABLET | Freq: Four times a day (QID) | ORAL | Status: DC | PRN

## 2017-02-16 MED ORDER — ASPIRIN EC 325 MG PO TBEC
325.0000 mg | DELAYED_RELEASE_TABLET | Freq: Every day | ORAL | Status: DC
  Administered 2017-02-16: 325 mg via ORAL
  Filled 2017-02-16 (×2): qty 1

## 2017-02-16 MED ORDER — BUPIVACAINE HCL (PF) 0.5 % IJ SOLN
INTRAMUSCULAR | Status: AC
  Filled 2017-02-16: qty 30

## 2017-02-16 MED ORDER — CEFAZOLIN SODIUM-DEXTROSE 2-4 GM/100ML-% IV SOLN
2.0000 g | Freq: Four times a day (QID) | INTRAVENOUS | Status: AC
  Administered 2017-02-16 – 2017-02-17 (×2): 2 g via INTRAVENOUS
  Filled 2017-02-16 (×2): qty 100

## 2017-02-16 MED ORDER — MIDAZOLAM HCL 2 MG/2ML IJ SOLN
INTRAMUSCULAR | Status: AC
  Filled 2017-02-16: qty 2

## 2017-02-16 MED ORDER — MIDAZOLAM HCL 2 MG/2ML IJ SOLN
1.0000 mg | Freq: Once | INTRAMUSCULAR | Status: AC
  Administered 2017-02-16: 1 mg via INTRAVENOUS

## 2017-02-16 MED ORDER — FENTANYL CITRATE (PF) 100 MCG/2ML IJ SOLN
INTRAMUSCULAR | Status: DC | PRN
  Administered 2017-02-16 (×4): 50 ug via INTRAVENOUS
  Administered 2017-02-16: 100 ug via INTRAVENOUS

## 2017-02-16 SURGICAL SUPPLY — 88 items
APL SKNCLS STERI-STRIP NONHPOA (GAUZE/BANDAGES/DRESSINGS) ×1
BANDAGE ACE 4X5 VEL STRL LF (GAUZE/BANDAGES/DRESSINGS) IMPLANT
BANDAGE ACE 6X5 VEL STRL LF (GAUZE/BANDAGES/DRESSINGS) IMPLANT
BENZOIN TINCTURE PRP APPL 2/3 (GAUZE/BANDAGES/DRESSINGS) ×3 IMPLANT
BIT DRILL 2.0 (BIT) ×2
BIT DRILL 2.0MM (BIT) ×1
BIT DRILL 2XNS DISP SS SM FRAG (BIT) IMPLANT
BIT DRILL 3.2 (BIT) ×3
BIT DRILL 3.2XCALB NS DISP (BIT) IMPLANT
BIT DRILL CALIBRATED 2.7 (BIT) ×2 IMPLANT
BIT DRILL CALIBRATED 2.7MM (BIT) ×1
BIT DRL 2XNS DISP SS SM FRAG (BIT) ×1
BIT DRL 3.2XCALB NS DISP (BIT) ×1
BNDG COHESIVE 4X5 TAN STRL (GAUZE/BANDAGES/DRESSINGS) ×3 IMPLANT
CLOSURE STERI-STRIP 1/2X4 (GAUZE/BANDAGES/DRESSINGS) ×1
CLSR STERI-STRIP ANTIMIC 1/2X4 (GAUZE/BANDAGES/DRESSINGS) ×1 IMPLANT
COVER SURGICAL LIGHT HANDLE (MISCELLANEOUS) ×3 IMPLANT
DRAIN PENROSE 1/2X12 LTX STRL (WOUND CARE) IMPLANT
DRAPE C-ARM 42X72 X-RAY (DRAPES) IMPLANT
DRAPE IMP U-DRAPE 54X76 (DRAPES) ×3 IMPLANT
DRAPE U-SHAPE 47X51 STRL (DRAPES) ×3 IMPLANT
DRSG AQUACEL AG ADV 3.5X14 (GAUZE/BANDAGES/DRESSINGS) ×2 IMPLANT
DRSG PAD ABDOMINAL 8X10 ST (GAUZE/BANDAGES/DRESSINGS) IMPLANT
DURAPREP 26ML APPLICATOR (WOUND CARE) ×3 IMPLANT
ELECT REM PT RETURN 9FT ADLT (ELECTROSURGICAL) ×3
ELECTRODE REM PT RTRN 9FT ADLT (ELECTROSURGICAL) ×1 IMPLANT
FACESHIELD WRAPAROUND (MASK) ×3 IMPLANT
GAUZE SPONGE 4X4 12PLY STRL (GAUZE/BANDAGES/DRESSINGS) IMPLANT
GAUZE XEROFORM 5X9 LF (GAUZE/BANDAGES/DRESSINGS) IMPLANT
GLOVE BIOGEL PI IND STRL 8 (GLOVE) ×1 IMPLANT
GLOVE BIOGEL PI INDICATOR 8 (GLOVE) ×2
GLOVE SURG ORTHO 8.0 STRL STRW (GLOVE) ×3 IMPLANT
GOWN STRL REUS W/ TWL LRG LVL3 (GOWN DISPOSABLE) ×2 IMPLANT
GOWN STRL REUS W/ TWL XL LVL3 (GOWN DISPOSABLE) ×1 IMPLANT
GOWN STRL REUS W/TWL LRG LVL3 (GOWN DISPOSABLE) ×6
GOWN STRL REUS W/TWL XL LVL3 (GOWN DISPOSABLE) ×3
K-WIRE 2X5 SS THRDED S3 (WIRE) ×9
KIT BASIN OR (CUSTOM PROCEDURE TRAY) ×3 IMPLANT
KIT ROOM TURNOVER OR (KITS) ×3 IMPLANT
KWIRE 2X5 SS THRDED S3 (WIRE) IMPLANT
MANIFOLD NEPTUNE II (INSTRUMENTS) ×3 IMPLANT
NEEDLE 21X1 OR PACK (NEEDLE) IMPLANT
NS IRRIG 1000ML POUR BTL (IV SOLUTION) ×15 IMPLANT
PACK SHOULDER (CUSTOM PROCEDURE TRAY) ×3 IMPLANT
PACK UNIVERSAL I (CUSTOM PROCEDURE TRAY) ×3 IMPLANT
PAD ARMBOARD 7.5X6 YLW CONV (MISCELLANEOUS) ×6 IMPLANT
PAD CAST 4YDX4 CTTN HI CHSV (CAST SUPPLIES) IMPLANT
PADDING CAST COTTON 4X4 STRL (CAST SUPPLIES)
PEG LOCKING 3.2MMX44 (Peg) ×6 IMPLANT
PEG LOCKING 3.2X40 (Peg) ×3 IMPLANT
PEG LOCKING 3.2X42 (Screw) ×4 IMPLANT
PENCIL BUTTON HOLSTER BLD 10FT (ELECTRODE) IMPLANT
PLATE HUMERUS PROXIMAL 11H LT (Plate) ×3 IMPLANT
RETRIEVER SUT LRG (INSTRUMENTS) ×3 IMPLANT
SCREW CORTICAL 2.7MM  24MM (Screw) ×4 IMPLANT
SCREW CORTICAL 2.7MM  26MM (Screw) ×4 IMPLANT
SCREW CORTICAL 2.7MM 24MM (Screw) ×2 IMPLANT
SCREW CORTICAL 2.7MM 26MM (Screw) ×2 IMPLANT
SCREW LOCK CANC STAR 4X40 (Screw) ×3 IMPLANT
SCREW LOCK CORT STAR 3.5X20 (Screw) ×4 IMPLANT
SCREW LOCK CORT STAR 3.5X24 (Screw) ×2 IMPLANT
SCREW LOW PROF TIS 3.5X28MM (Screw) ×4 IMPLANT
SCREW LP NL T15 3.5X22 (Screw) ×3 IMPLANT
SCREW LP NL T15 3.5X24 (Screw) ×2 IMPLANT
SCREW T15 LP CORT 3.5X42MM NS (Screw) ×3 IMPLANT
SCREW T15 LP CORT 3.5X46MM NS (Screw) ×3 IMPLANT
SCREW T15 MD 3.5X42MM NS (Screw) ×2 IMPLANT
SPONGE LAP 18X18 X RAY DECT (DISPOSABLE) ×3 IMPLANT
SPONGE LAP 4X18 X RAY DECT (DISPOSABLE) ×6 IMPLANT
STAPLER VISISTAT 35W (STAPLE) IMPLANT
STOCKINETTE IMPERVIOUS 9X36 MD (GAUZE/BANDAGES/DRESSINGS) IMPLANT
SUCTION FRAZIER HANDLE 10FR (MISCELLANEOUS)
SUCTION TUBE FRAZIER 10FR DISP (MISCELLANEOUS) IMPLANT
SUT MAXBRAID (SUTURE) ×18 IMPLANT
SUT MNCRL AB 3-0 PS2 18 (SUTURE) ×2 IMPLANT
SUT SILK 2 0 (SUTURE) ×3
SUT SILK 2-0 18XBRD TIE 12 (SUTURE) ×1 IMPLANT
SUT VIC AB 0 CT1 27 (SUTURE) ×12
SUT VIC AB 0 CT1 27XBRD ANBCTR (SUTURE) IMPLANT
SUT VIC AB 2-0 CT1 27 (SUTURE) ×3
SUT VIC AB 2-0 CT1 TAPERPNT 27 (SUTURE) ×1 IMPLANT
SUT VIC AB 2-0 CTB1 (SUTURE) IMPLANT
TOWEL OR 17X24 6PK STRL BLUE (TOWEL DISPOSABLE) ×3 IMPLANT
TOWEL OR 17X26 10 PK STRL BLUE (TOWEL DISPOSABLE) ×3 IMPLANT
TUBE CONNECTING 12'X1/4 (SUCTIONS)
TUBE CONNECTING 12X1/4 (SUCTIONS) IMPLANT
WATER STERILE IRR 1000ML POUR (IV SOLUTION) ×3 IMPLANT
YANKAUER SUCT BULB TIP NO VENT (SUCTIONS) IMPLANT

## 2017-02-16 NOTE — Op Note (Signed)
NAME:  [name], [name] NO.:  [id number]  MEDICAL RECORD NO.:  [id number]  LOCATION:                                 FACILITY:  PHYSICIAN:  Anderson Malta, M.D.    DATE OF BIRTH:  Oct 27, 1955  DATE OF PROCEDURE: DATE OF DISCHARGE:                              OPERATIVE REPORT   PREOPERATIVE DIAGNOSIS:  Comminuted multifragmented proximal humerus fracture.  POSTOPERATIVE DIAGNOSIS:  Comminuted multifragmented proximal humerus fracture.  PROCEDURE:  Open reduction and internal fixation of proximal humerus fracture.  SURGEON:  Anderson Malta, M.D.  ASSISTANT:  April Green, RNFA.  INDICATIONS:  [name] is a 61 year old patient with comminuted proximal humerus fracture.  She presents now for operative management after explanation of risks and benefits.  DESCRIPTION OF PROCEDURE:  The patient was brought to the operating room where general anesthetic was induced.  Preoperative antibiotics were administered.  Time-out was called.  Left shoulder was prescrubbed with alcohol and Betadine and allowed to air dry, prepped with DuraPrep solution, and draped in a sterile manner.  Ioban used to cover the operative field.  Anterior deltopectoral approach was made and extended distally.  Skin and subcutaneous tissue were sharply divided.  The cephalic vein was mobilized laterally.  Biceps was split.  Crossing vessels were coagulated.  The midshaft humerus and distally were exposed carefully using subperiosteal elevation.  The patient had one fracture fragment, which was attached to the pectoralis major attachment site. Another fragment was __________ fragment posterior laterally, which indicated length of the fracture.  The third fragment was free fragment, which was posterior and this was used to key in the other 2 major fragments.  Soft tissue attachments were maintained when possible.  At this time, the initial fragment was reduced to the humerus proximally. This was done  with a non-lag static 2.5 cortical screw.  The second piece was keyed in and also fixed to the proximal portion.  The third piece was then keyed in again to the proximal portion and secured with a non-lag screw due to the comminution.  At this time, the proximal 4 fragments were attached to the distal fragment with correct rotational alignment.  A lag screw was placed in the very small spike of the posterolateral fragment attaching it to the shaft fragment, but that did not hold.  Cables were then attached very carefully with care being taken to avoid injury to the radial nerve, which was visualized.  These sutures were then placed around the comminuted portion of the fracture around the screws.  Following that, the plate was applied and fixed proximally and distally.  Nonlocking compression screws were used to contour the plate to the bone.  These were then replaced with locking screws.  At this time, secure fixation was achieved with 6 cortical screws distally and all screws filled proximally.  Thorough irrigation with 3 liters of irrigating solution was performed.  Deltoid reattached to the plate through the holes in the plate.  Pectoralis also reattached to the plate using MaxBraid sutures x2.  Deltoid reattached using MaxBraid sutures x3.  No bone graft was really required due to the good anatomic realignment  of the fracture.  Thorough irrigation again performed and the deltopectoral interval was closed using #1 Vicryl suture followed by interrupted inverted 0 Vicryl suture, 2-0 Vicryl suture, and then Monocryl.  Aquacel dressing placed along with a shoulder immobilizer. The patient tolerated the procedure well without immediate complications and transferred to the recovery room in stable condition.     Anderson Malta, M.D.   ______________________________ Darnell Level. Alphonzo Severance, M.D.    GSD/MEDQ  D:  02/16/2017  T:  02/16/2017  Job:  [id number]

## 2017-02-16 NOTE — Transfer of Care (Signed)
Immediate Anesthesia Transfer of Care Note  Patient: [name]  Procedure(s) Performed: Procedure(s): OPEN REDUCTION INTERNAL FIXATION (ORIF) LEFT PROXIMAL HUMERUS FRACTURE (Left)  Patient Location: PACU  Anesthesia Type:GA combined with regional for post-op pain  Level of Consciousness: awake, alert , oriented and patient cooperative  Airway & Oxygen Therapy: Patient Spontanous Breathing  Post-op Assessment: Report given to RN and Post -op Vital signs reviewed and stable  Post vital signs: Reviewed and stable  Last Vitals:  Vitals:   02/16/17 0935 02/16/17 1406  BP: 117/71   Pulse: 72   Resp: 15   Temp:  37.3 C  SpO2: 98%     Last Pain:  Vitals:   02/16/17 0851  TempSrc:   PainSc: 8          Complications: No apparent anesthesia complications

## 2017-02-16 NOTE — H&P (Signed)
[name] is an 61 y.o. female.   Chief Complaint: Left shoulder pain HPI: [name] is a 61 year old patient with multiple sclerosis who had a fall approximately a week ago.  She sustained a comminuted and displaced proximal humerus fracture.  She presents now for operative management after explanation of risks and benefits.  The fracture has undergone significant displacement since its original injury.  No family history or personal history of DVT.  Patient denies any other orthopedic complaints  Past Medical History:  Diagnosis Date  . Arthritis    Osteoarthritis  . Colon polyps   . GERD (gastroesophageal reflux disease)   . Hiatal hernia   . Hypertension   . MS (multiple sclerosis) ([place]) 12/09/2013  . Multiple sclerosis ([place])   . Seizures ([place]) 01/04/2005   1 and Only    Past Surgical History:  Procedure Laterality Date  . COLONOSCOPY    . COLONOSCOPY N/A 09/16/2012   Procedure: COLONOSCOPY;  Surgeon: Rogene Houston, MD;  Location: [facility];  Service: Endoscopy;  Laterality: N/A;  1030-rescheduled to 9:30 Ann notified pt  . DILATION AND CURETTAGE OF UTERUS      Family History  Problem Relation Age of Onset  . Stomach cancer Father   . Sick sinus syndrome Father   . Colon cancer Neg Hx    Social History:  reports that she has been smoking Cigarettes.  She has a 22.50 pack-year smoking history. She has never used smokeless tobacco. She reports that she drinks about 4.8 oz of alcohol per week . She reports that she does not use drugs.  Allergies:  Allergies  Allergen Reactions  . Codeine Nausea And Vomiting  . Dilaudid [Hydromorphone Hcl] Nausea And Vomiting    Zofran helped    Medications Prior to Admission  Medication Sig Dispense Refill  . Artificial Tear Solution (SOOTHE XP OP) Place 1 drop into both eyes 3 (three) times daily as needed (for dry/irritated eyes).     . cholecalciferol (VITAMIN D) 400 units TABS tablet Take 400 Units by mouth daily.    .  Cyanocobalamin (VITAMIN B-12) 500 MCG SUBL Place under the tongue.    Marland Kitchen esomeprazole (NEXIUM) 40 MG capsule Take 1 capsule (40 mg total) by mouth daily at 12 noon. (Patient taking differently: Take 40 mg by mouth daily. ) 90 capsule 1  . Glatiramer Acetate (COPAXONE) 40 MG/ML SOSY Inject 40 mg into the skin as directed. 3 times a week (Patient taking differently: Inject 40 mg into the skin 3 (three) times a week. Monday, Thursday, & Sunday in the evening.) 36 Syringe 5  . HYDROcodone-acetaminophen (NORCO/VICODIN) 5-325 MG tablet Take 1 tablet by mouth every 4 (four) hours as needed for moderate pain. 1 or 2 tabs PO q6 hours prn pain (Patient taking differently: Take 1 tablet by mouth every 6 (six) hours as needed (for pain.). ) 42 tablet 0  . ibuprofen (ADVIL,MOTRIN) 200 MG tablet Take 600 mg by mouth every 8 (eight) hours as needed for moderate pain (for pain.).    Marland Kitchen losartan (COZAAR) 50 MG tablet Take 75 mg by mouth daily.    . methocarbamol (ROBAXIN) 500 MG tablet Take 1 tablet (500 mg total) by mouth every 6 (six) hours as needed for muscle spasms (muscle spasm/pain). 56 tablet 5  . Probiotic Product (PROBIOTIC PO) Take 1 capsule by mouth daily.    . Vilazodone HCl (VIIBRYD) 10 MG TABS TAKE 1 TABLET BY MOUTH DAILY OR AS DIRECTED BY DOCTOR (Patient  taking differently: Take 10 mg by mouth at bedtime. ) 90 tablet 3  . diazepam (VALIUM) 5 MG tablet Take 2.5 mg by mouth at bedtime as needed (sleep).       No results found for this or any previous visit (from the past 48 hour(s)). No results found.  Review of Systems  Musculoskeletal: Positive for joint pain.  All other systems reviewed and are negative.   Blood pressure 136/77, pulse 76, temperature 98 F (36.7 C), temperature source Oral, resp. rate 18, weight 148 lb (67.1 kg), SpO2 100 %. Physical Exam  Constitutional: She appears well-developed.  HENT:  Head: Normocephalic.  Eyes: Pupils are equal, round, and reactive to light.  Neck:  Normal range of motion.  Cardiovascular: Normal rate.   Respiratory: Effort normal.  Neurological: She is alert.  Skin: Skin is warm.  Psychiatric: She has a normal mood and affect.  Left shoulder examined demonstrates swelling and ecchymosis working its way down the arm.  Motor sensory function to the hand is intact.  Splint is in place so deltoid and axillary Nerve function difficult to assess.   Assessment/Plan Impression is comminuted multipart proximal humerus fracture with displacement.  Plan is open reduction internal fixation.  May use interfragmentary fixation versus bridge plate construct.  Need to diminish smoking his discussed.  The risk and benefits including but not limited to infection nerve and vessel damage nonunion malunion as well as potential need for more surgery are all discussed.  All questions answered  Joan Malta, MD 02/16/2017, 8:55 AM

## 2017-02-16 NOTE — Op Note (Deleted)
  The note originally documented on this encounter has been moved the the encounter in which it belongs.  

## 2017-02-16 NOTE — Anesthesia Postprocedure Evaluation (Signed)
Anesthesia Post Note  Patient: [name]  Procedure(s) Performed: Procedure(s) (LRB): OPEN REDUCTION INTERNAL FIXATION (ORIF) LEFT PROXIMAL HUMERUS FRACTURE (Left)     Patient location during evaluation: PACU Anesthesia Type: General and Regional Level of consciousness: awake and alert Pain management: pain level controlled Vital Signs Assessment: post-procedure vital signs reviewed and stable Respiratory status: spontaneous breathing, nonlabored ventilation, respiratory function stable and patient connected to nasal cannula oxygen Cardiovascular status: blood pressure returned to baseline and stable Postop Assessment: no signs of nausea or vomiting Anesthetic complications: no    Last Vitals:  Vitals:   02/16/17 0935 02/16/17 1406  BP: 117/71 106/79  Pulse: 72 (!) 112  Resp: 15 20  Temp:  37.3 C  SpO2: 98% 98%    Last Pain:  Vitals:   02/16/17 1406  TempSrc:   PainSc: 0-No pain                 Montez Hageman

## 2017-02-16 NOTE — Brief Op Note (Signed)
02/16/2017  1:29 PM  PATIENT:  [name]  61 y.o. female  PRE-OPERATIVE DIAGNOSIS:  LEFT PROXIMAL HUMERUS FRACTURE  POST-OPERATIVE DIAGNOSIS:  LEFT PROXIMAL HUMERUS FRACTURE  PROCEDURE:  Procedure(s): OPEN REDUCTION INTERNAL FIXATION (ORIF) LEFT PROXIMAL HUMERUS FRACTURE  SURGEON:  Surgeon(s): Meredith Pel, MD  ASSISTANT: April green  ANESTHESIA:   general  EBL: 150 ml    Total I/O In: 1000 [I.V.:1000] Out: 1050 [Urine:800; Blood:250]  BLOOD ADMINISTERED: none  DRAINS: none   LOCAL MEDICATIONS USED:  none  SPECIMEN:  No Specimen  COUNTS:  YES  TOURNIQUET:  * No tourniquets in log *  DICTATION: .Other Dictation: Dictation Number [id number]  PLAN OF CARE: Admit for overnight observation  PATIENT DISPOSITION:  PACU - hemodynamically stable

## 2017-02-16 NOTE — Anesthesia Preprocedure Evaluation (Signed)
Anesthesia Evaluation  Patient identified by MRN, date of birth, ID band Patient awake    Reviewed: Allergy & Precautions, NPO status , Patient's Chart, lab work & pertinent test results  Airway Mallampati: II  TM Distance: >3 FB Neck ROM: Full    Dental no notable dental hx.    Pulmonary Current Smoker,    Pulmonary exam normal breath sounds clear to auscultation       Cardiovascular hypertension, Normal cardiovascular exam Rhythm:Regular Rate:Normal     Neuro/Psych MS  Neuromuscular disease negative psych ROS   GI/Hepatic Neg liver ROS, hiatal hernia,   Endo/Other  negative endocrine ROS  Renal/GU negative Renal ROS  negative genitourinary   Musculoskeletal negative musculoskeletal ROS (+)   Abdominal   Peds negative pediatric ROS (+)  Hematology negative hematology ROS (+)   Anesthesia Other Findings   Reproductive/Obstetrics negative OB ROS                             Anesthesia Physical Anesthesia Plan  ASA: II  Anesthesia Plan: General   Post-op Pain Management: GA combined w/ Regional for post-op pain   Induction:   PONV Risk Score and Plan: 1 and 2 and Ondansetron, Treatment may vary due to age or medical condition and Dexamethasone  Airway Management Planned: Oral ETT  Additional Equipment:   Intra-op Plan:   Post-operative Plan: Extubation in OR  Informed Consent: I have reviewed the patients History and Physical, chart, labs and discussed the procedure including the risks, benefits and alternatives for the proposed anesthesia with the patient or authorized representative who has indicated his/her understanding and acceptance.   Dental advisory given  Plan Discussed with: CRNA  Anesthesia Plan Comments:         Anesthesia Quick Evaluation

## 2017-02-16 NOTE — Anesthesia Procedure Notes (Addendum)
Anesthesia Regional Block: Supraclavicular block   Pre-Anesthetic Checklist: ,, timeout performed, Correct Patient, Correct Site, Correct Laterality, Correct Procedure, Correct Position, site marked, Risks and benefits discussed,  Surgical consent,  Pre-op evaluation,  At surgeon's request and post-op pain management  Laterality: Left and Upper  Prep: Maximum Sterile Barrier Precautions used, chloraprep       Needles:  Injection technique: Single-shot  Needle Type: Echogenic Stimulator Needle     Needle Length: 10cm      Additional Needles:   Procedures: ultrasound guided,,,,,,,,  Narrative:  Start time: 02/16/2017 9:17 AM End time: 02/16/2017 9:27 AM Injection made incrementally with aspirations every 5 mL.  Performed by: Personally  Anesthesiologist: Montez Hageman  Additional Notes: Risks, benefits and alternative to block explained extensively.  Patient tolerated procedure well, without complications.

## 2017-02-16 NOTE — Anesthesia Procedure Notes (Signed)
Procedure Name: Intubation Date/Time: 02/16/2017 9:55 AM Performed by: Adalberto Ill Pre-anesthesia Checklist: Patient identified, Emergency Drugs available, Suction available, Timeout performed and Patient being monitored Patient Re-evaluated:Patient Re-evaluated prior to induction Oxygen Delivery Method: Circle system utilized Preoxygenation: Pre-oxygenation with 100% oxygen Induction Type: IV induction Ventilation: Mask ventilation without difficulty Laryngoscope Size: Miller and 2 Grade View: Grade I Tube type: Oral Tube size: 7.0 mm Number of attempts: 1 Airway Equipment and Method: Stylet Placement Confirmation: ETT inserted through vocal cords under direct vision,  positive ETCO2 and breath sounds checked- equal and bilateral Secured at: 22 cm Tube secured with: Tape Dental Injury: Teeth and Oropharynx as per pre-operative assessment

## 2017-02-17 ENCOUNTER — Encounter (HOSPITAL_COMMUNITY): Payer: Self-pay | Admitting: Orthopedic Surgery

## 2017-02-17 DIAGNOSIS — S42292A Other displaced fracture of upper end of left humerus, initial encounter for closed fracture: Secondary | ICD-10-CM | POA: Diagnosis not present

## 2017-02-17 MED ORDER — HYDROCODONE-ACETAMINOPHEN 10-325 MG PO TABS: 1.0000 | ORAL_TABLET | Freq: Four times a day (QID) | ORAL | 0 refills | Status: DC | PRN

## 2017-02-17 NOTE — Progress Notes (Signed)
Patient is discharged from room 3C07 at this time. Alert and in stable condition. IV site d/c'd and instructions read to patient and parents with understanding verbalized. Left unit via wheelchair with all belongings at side.

## 2017-02-17 NOTE — Progress Notes (Signed)
Subjective: Pt stable Pain ok   Objective: Vital signs in last 24 hours: Temp:  [97.7 F (36.5 C)-99.1 F (37.3 C)] 98.3 F (36.8 C) (08/28 0400) Pulse Rate:  [71-112] 76 (08/28 0400) Resp:  [11-21] 18 (08/28 0400) BP: (101-138)/(58-122) 105/67 (08/28 0400) SpO2:  [96 %-100 %] 97 % (08/28 0400) Weight:  [148 lb (67.1 kg)] 148 lb (67.1 kg) (08/27 1545)  Intake/Output from previous day: 08/27 0701 - 08/28 0700 In: 2310 [P.O.:360; I.V.:1850; IV Piggyback:100] Out: 1100 [Urine:850; Blood:250] Intake/Output this shift: No intake/output data recorded.  Exam:  epl fpl io ok left hand  Labs: No results for input(s): HGB in the last 72 hours. No results for input(s): WBC, RBC, HCT, PLT in the last 72 hours. No results for input(s): NA, K, CL, CO2, BUN, CREATININE, GLUCOSE, CALCIUM in the last 72 hours. No results for input(s): LABPT, INR in the last 72 hours.  Assessment/Plan: Dc today after ot conservative protocol   G Alphonzo Severance 02/17/2017, 8:05 AM

## 2017-02-17 NOTE — Progress Notes (Signed)
Occupational Therapy Evaluation Patient Details Name: [name] MRN: [id number] DOB: May 17, 1956 Today's Date: 02/17/2017    History of Present Illness Pt is a 61 y.o. female s/p ORIF Left Proximal Humerus Fracture 02/16/17. PHM significant for MS, osteoarthritis, and HTN.    Clinical Impression   PTA, pt had been managing with sling on fractured LUE with assistance of family. Pt states she has not removed sling. Educated pt on compensatory techniques regarding management of LUE and ADL. Pt states she plans to continue to "do it as she had been doing". Also educated pt on elbow/wrist/hand ROM. Written information given/reviewed with pt. Educated pt on not completing exercises with L shoulder at this time and to maintain NWB status. Pt states family will be able to assist as needed after DC. Pt to follow up with surgeon who will progress rehab of L shoulder. Nsg to page OT if family has other questions.     Follow Up Recommendations  DC plan and follow up therapy as arranged by surgeon    Equipment Recommendations  3 in 1 bedside commode    Recommendations for Other Services       Precautions / Restrictions Precautions Precautions: Shoulder Type of Shoulder Precautions: NWB L Shoulder Shoulder Interventions: Shoulder sling/immobilizer;At all times (Except during ADLs and Exercises) Precaution Booklet Issued: Yes (comment) Precaution Comments: Pt educated on shoulder precautions Required Braces or Orthoses: Sling Restrictions Weight Bearing Restrictions: Yes LUE Weight Bearing: Non weight bearing Other Position/Activity Restrictions: Pt educated on weight bearing restrictions      Mobility Bed Mobility               General bed mobility comments: Pt in chair upon OT arrival.  Transfers Overall transfer level: Modified independent Equipment used: None             General transfer comment: Increased time due to pain and LUE in sling.    Balance Overall balance  assessment: Modified Independent (Increased time due to LUE in sling and pain.)                                         ADL either performed or assessed with clinical judgement   ADL Overall ADL's : Needs assistance/impaired Eating/Feeding: Modified independent;Cueing for compensatory techinques;Sitting   Grooming: Adhering to UE precautions;Cueing for UE precautions;Standing;Min guard;Set up   Upper Body Bathing: Adhering to UE precautions;Cueing for UE precautions;Sitting;Set up;Minimal assistance   Lower Body Bathing: Cueing for safety;Cueing for compensatory techniques;Sit to/from stand;Min guard;Set up   Upper Body Dressing : Minimal assistance;Adhering to UE precautions;Cueing for safety;Cueing for compensatory techniques;Cueing for UE precautions;Sitting Upper Body Dressing Details (indicate cue type and reason): Pt educated on compensatory technique for UB dressing adhering to UE precautions.  Lower Body Dressing: Minimal assistance;Cueing for compensatory techniques;Sit to/from stand   Toilet Transfer: Modified Independent;Ambulation;Comfort height toilet Toilet Transfer Details (indicate cue type and reason): Increased time needed for pain Toileting- Clothing Manipulation and Hygiene: Modified independent;Sit to/from Nurse, children's Details (indicate cue type and reason): Pt reports that she plans to sponge bathe until her LUE heals more.  Functional mobility during ADLs: Supervision/safety General ADL Comments: Pt educated on ADLs with shoulder precautions and weight bearing status.      Vision         Perception     Praxis  Pertinent Vitals/Pain Pain Assessment: 0-10 Pain Score: 8  Pain Location: L UE Pain Descriptors / Indicators: Aching;Sore Pain Intervention(s): Limited activity within patient's tolerance;Monitored during session     Hand Dominance Right   Extremity/Trunk Assessment Upper Extremity Assessment Upper  Extremity Assessment: LUE deficits/detail LUE Deficits / Details: NWB. No AROM/PROM of shoulder.  LUE: Unable to fully assess due to immobilization   Lower Extremity Assessment Lower Extremity Assessment: Overall WFL for tasks assessed   Cervical / Trunk Assessment Cervical / Trunk Assessment: Normal   Communication Communication Communication: No difficulties   Cognition Arousal/Alertness: Awake/alert Behavior During Therapy: WFL for tasks assessed/performed;Anxious Overall Cognitive Status: Within Functional Limits for tasks assessed                                 General Comments: Pt very anxious about moving her LUE and taking the sling off.   General Comments  Pt educated on compensatory strategies to complete ADLs. Pt anxious to take sling off.     Exercises Exercises: Hand exercises Hand Exercises Wrist Flexion: AROM;10 reps;Seated Wrist Extension: AROM;10 reps;Seated Digit Composite Flexion: AROM;10 reps;Seated   Shoulder Instructions      Home Living Family/patient expects to be discharged to:: Private residence Living Arrangements: Alone Available Help at Discharge: Family;Friend(s) Type of Home: Apartment Home Access: Level entry     Home Layout: One level     Bathroom Shower/Tub: Teacher, early years/pre: Standard Bathroom Accessibility: Yes How Accessible: Accessible via walker Home Equipment: None   Additional Comments: Pt reports that parents and a friend are available to provide assistance as needed.      Prior Functioning/Environment Level of Independence: Independent                 OT Problem List: Decreased range of motion;Decreased activity tolerance;Decreased knowledge of precautions;Impaired UE functional use;Pain      OT Treatment/Interventions: Self-care/ADL training;Therapeutic exercise;Energy conservation;DME and/or AE instruction;Therapeutic activities;Patient/family education    OT Goals(Current  goals can be found in the care plan section) Acute Rehab OT Goals Patient Stated Goal: For the pain to go away anda to go home  OT Goal Formulation: With patient Time For Goal Achievement: 03/03/17 Potential to Achieve Goals: Good ADL Goals Pt Will Perform Upper Body Bathing: with modified independence;standing (AE as needed) Pt Will Perform Upper Body Dressing: with modified independence;with set-up;sitting (Compensatory technique for UE precautions) Pt/caregiver will Perform Home Exercise Program: Increased ROM;Left upper extremity;Independently;With written HEP provided (wrist flex/ext, finger flex/ext, and elbow flex/ext. ) Additional ADL Goal #1: Pt will verbalize shoulder precautions independently.   OT Frequency: Min 2X/week   Barriers to D/C:            Co-evaluation              AM-PAC PT "6 Clicks" Daily Activity     Outcome Measure Help from another person eating meals?: A Little Help from another person taking care of personal grooming?: None Help from another person toileting, which includes using toliet, bedpan, or urinal?: None Help from another person bathing (including washing, rinsing, drying)?: A Little Help from another person to put on and taking off regular upper body clothing?: A Lot Help from another person to put on and taking off regular lower body clothing?: A Little 6 Click Score: 19   End of Session Nurse Communication: Mobility status;Weight bearing status;Precautions  Activity Tolerance: Patient tolerated treatment well  Patient left: in chair;with call bell/phone within reach  OT Visit Diagnosis: Pain Pain - Right/Left: Left Pain - part of body: Shoulder;Arm                Time: 2633-3545 OT Time Calculation (min): 25 min Charges:  OT General Charges $OT Visit: 1 Visit OT Evaluation $OT Eval Moderate Complexity: 1 Mod OT Treatments $Self Care/Home Management : 8-22 mins G-Codes: OT G-codes **NOT FOR INPATIENT CLASS** Functional  Assessment Tool Used: Clinical judgement Functional Limitation: Self care Self Care Current Status (G2563): At least 20 percent but less than 40 percent impaired, limited or restricted Self Care Goal Status (S9373): At least 1 percent but less than 20 percent impaired, limited or restricted   [facility], OT/L  [phone] 02/17/2017  Dariel Betzer,HILLARY 02/17/2017, 10:49 AM

## 2017-02-17 NOTE — Therapy (Deleted)
Occupational Therapy Evaluation Patient Details Name: [name] MRN: [id number] DOB: [birth year] Today's Date: 02/17/2017    History of Present Illness Pt is a 61 y.o. female s/p ORIF Left Proximal Humerus Fracture 02/16/17 due to a fall. PHM significant for MS, osteoarthritis, and HTN.   Clinical Impression   Pt reports being independent in ADLs and IADLs PTA. Currently pt requires min guard and set up for ADLs with the exception of min assist for UB and LB dressing/bathing including donning/doffing sling. Pt reports that family is able to provided supervision and assistance as needed. Pt able to safely discharge home when medically stable with intermittent supervision from family. Nursing asked to page OT if family has questions about donning/doffing sling or UB clothing.     Follow Up Recommendations  DC plan and follow up therapy as arranged by surgeon    Equipment Recommendations  3 in 1 bedside commode    Recommendations for Other Services       Precautions / Restrictions Precautions Precautions: Shoulder Type of Shoulder Precautions: NWB L Shoulder Shoulder Interventions: Shoulder sling/immobilizer;At all times (Except during ADLs and Exercises) Precaution Booklet Issued: Yes (comment) Precaution Comments: Pt educated on shoulder precautions Required Braces or Orthoses: Sling Restrictions Weight Bearing Restrictions: Yes LUE Weight Bearing: Non weight bearing Other Position/Activity Restrictions: Pt educated on weight bearing restrictions      Mobility Bed Mobility               General bed mobility comments: Pt in chair upon OT arrival.  Transfers Overall transfer level: Modified independent Equipment used: None             General transfer comment: Increased time due to pain and LUE in sling.    Balance Overall balance assessment: Modified Independent (Increased time due to LUE in sling and pain.)                                          ADL either performed or assessed with clinical judgement   ADL Overall ADL's : Needs assistance/impaired Eating/Feeding: Modified independent;Cueing for compensatory techinques;Sitting   Grooming: Adhering to UE precautions;Cueing for UE precautions;Standing;Min guard;Set up   Upper Body Bathing: Adhering to UE precautions;Cueing for UE precautions;Sitting;Set up;Minimal assistance   Lower Body Bathing: Cueing for safety;Cueing for compensatory techniques;Sit to/from stand;Min guard;Set up   Upper Body Dressing : Minimal assistance;Adhering to UE precautions;Cueing for safety;Cueing for compensatory techniques;Cueing for UE precautions;Sitting Upper Body Dressing Details (indicate cue type and reason): Pt educated on compensatory technique for UB dressing adhering to UE precautions.  Lower Body Dressing: Minimal assistance;Cueing for compensatory techniques;Sit to/from stand   Toilet Transfer: Modified Independent;Ambulation;Comfort height toilet Toilet Transfer Details (indicate cue type and reason): Increased time needed for pain Toileting- Clothing Manipulation and Hygiene: Modified independent;Sit to/from Nurse, children's Details (indicate cue type and reason): Pt reports that she plans to sponge bathe until her LUE heals more.  Functional mobility during ADLs: Supervision/safety General ADL Comments: Pt educated on compensatory strategies to complete ADLs with shoulder precautions and weight bearing status.      Vision         Perception     Praxis      Pertinent Vitals/Pain Pain Assessment: 0-10 Pain Score: 8  Pain Location: L UE Pain Descriptors / Indicators: Aching;Sore Pain Intervention(s): Limited activity within patient's tolerance;Monitored during  session     Hand Dominance Right   Extremity/Trunk Assessment Upper Extremity Assessment Upper Extremity Assessment: LUE deficits/detail LUE Deficits / Details: NWB. No AROM/PROM of shoulder.  Hand, wrist, elbow ROM WFL.   LUE: Unable to fully assess due to immobilization   Lower Extremity Assessment Lower Extremity Assessment: Overall WFL for tasks assessed   Cervical / Trunk Assessment Cervical / Trunk Assessment: Normal   Communication Communication Communication: No difficulties   Cognition Arousal/Alertness: Awake/alert Behavior During Therapy: WFL for tasks assessed/performed;Anxious Overall Cognitive Status: Within Functional Limits for tasks assessed                                 General Comments: Pt very anxious about moving her LUE and taking the sling off.   General Comments  Pt educated on compensatory strategies to complete ADLs. Pt anxious to take sling off.    Exercises Exercises: Hand exercises Hand Exercises Wrist Flexion: AROM;10 reps;Seated Wrist Extension: AROM;10 reps;Seated Digit Composite Flexion: AROM;10 reps;Seated Elbow Flexion: AROM;10 reps, Seated.  Elbow Extenstion: AROM; 10 reps; Standing.    Shoulder Instructions      Home Living Family/patient expects to be discharged to:: Private residence Living Arrangements: Alone Available Help at Discharge: Family;Friend(s) Type of Home: Apartment Home Access: Level entry     Home Layout: One level     Bathroom Shower/Tub: Teacher, early years/pre: Standard Bathroom Accessibility: Yes How Accessible: Accessible via walker Home Equipment: None   Additional Comments: Pt reports that parents and a friend are available to provide assistance as needed.      Prior Functioning/Environment Level of Independence: Independent                 OT Problem List: Decreased range of motion;Decreased activity tolerance;Decreased knowledge of precautions;Impaired UE functional use;Pain; History of falls.      OT Treatment/Interventions: Self-care/ADL training;Therapeutic exercise;Energy conservation;DME and/or AE instruction;Therapeutic activities;Patient/family  education    OT Goals(Current goals can be found in the care plan section) Acute Rehab OT Goals Patient Stated Goal: For the pain to go away anda to go home  OT Goal Formulation: With patient Time For Goal Achievement: 03/03/17 Potential to Achieve Goals: Good  OT Frequency: Min 2X/week   Barriers to D/C:            Co-evaluation              AM-PAC PT "6 Clicks" Daily Activity     Outcome Measure Help from another person eating meals?: A Little Help from another person taking care of personal grooming?: None Help from another person toileting, which includes using toliet, bedpan, or urinal?: None Help from another person bathing (including washing, rinsing, drying)?: A Little Help from another person to put on and taking off regular upper body clothing?: A Lot Help from another person to put on and taking off regular lower body clothing?: A Little 6 Click Score: 19   End of Session Nurse Communication: Mobility status;Weight bearing status;Precautions  Activity Tolerance: Patient tolerated treatment well Patient left: in chair;with call bell/phone within reach  OT Visit Diagnosis: Pain Pain - Right/Left: Left Pain - part of body: Shoulder;Arm                Time: 0962-8366 OT Time Calculation (min): 29 min Charges:    G-Codes:     Boykin Peek, OTS [phone]  Boykin Peek 02/17/2017, 9:19 AM

## 2017-02-18 NOTE — Discharge Summary (Signed)
Physician Discharge Summary  Patient ID: [name] MRN: [id number] DOB/AGE: 07-02-1955 61 y.o.  Admit date: 02/16/2017 Discharge date: 02/17/2017  Admission Diagnoses:  Active Problems:   Proximal humerus fracture   Discharge Diagnoses:  Same  Surgeries: Procedure(s): OPEN REDUCTION INTERNAL FIXATION (ORIF) LEFT PROXIMAL HUMERUS FRACTURE on 02/16/2017   Consultants:   Discharged Condition: Stable  Hospital Course: [name] is an 61 y.o. female who was admitted 02/16/2017 with a chief complaint of left shoulder pain, and found to have a diagnosis of comminuted complex proximal humerus fracture.  They were brought to the operating room on 02/16/2017 and underwent the above named procedures.  Patient tolerated the procedure well and was comfortable at the time of discharge.  We will keep her generally living at the elbow only for the first 3 weeks.  Discharged home on good pain medicine to help keep the pain under control.  I'll see her back in a week for clinical recheck.  Motor sensory function to the hand intact at the time of discharge.  Antibiotics given:  Anti-infectives    Start     Dose/Rate Route Frequency Ordered Stop   02/16/17 1800  ceFAZolin (ANCEF) IVPB 2g/100 mL premix     2 g 200 mL/hr over 30 Minutes Intravenous Every 6 hours 02/16/17 1553 02/17/17 0953   02/16/17 0759  ceFAZolin (ANCEF) IVPB 2g/100 mL premix     2 g 200 mL/hr over 30 Minutes Intravenous On call to O.R. 02/16/17 1601 02/16/17 1342    .  Recent vital signs:  Vitals:   02/16/17 2355 02/17/17 0400  BP: 101/66 105/67  Pulse: 71 76  Resp: 18 18  Temp: 97.7 F (36.5 C) 98.3 F (36.8 C)  SpO2: 98% 97%    Recent laboratory studies:  Results for orders placed or performed during the hospital encounter of 09/32/35  Basic metabolic panel  Result Value Ref Range   Sodium 139 135 - 145 mmol/L   Potassium 3.6 3.5 - 5.1 mmol/L   Chloride 104 101 - 111 mmol/L   CO2 24 22 - 32 mmol/L   Glucose,  Bld 96 65 - 99 mg/dL   BUN 5 (L) 6 - 20 mg/dL   Creatinine, Ser 0.68 0.44 - 1.00 mg/dL   Calcium 9.8 8.9 - 10.3 mg/dL   GFR calc non Af Amer >60 >60 mL/min   GFR calc Af Amer >60 >60 mL/min   Anion gap 11 5 - 15  CBC  Result Value Ref Range   WBC 6.8 4.0 - 10.5 K/uL   RBC 4.07 3.87 - 5.11 MIL/uL   Hemoglobin 13.9 12.0 - 15.0 g/dL   HCT 40.2 36.0 - 46.0 %   MCV 98.8 78.0 - 100.0 fL   MCH 34.2 (H) 26.0 - 34.0 pg   MCHC 34.6 30.0 - 36.0 g/dL   RDW 12.8 11.5 - 15.5 %   Platelets 279 150 - 400 K/uL    Discharge Medications:   Allergies as of 02/17/2017      Reactions   Codeine Nausea And Vomiting   Dilaudid [hydromorphone Hcl] Nausea And Vomiting   Zofran helped      Medication List    STOP taking these medications   HYDROcodone-acetaminophen 5-325 MG tablet Commonly known as:  NORCO/VICODIN Replaced by:  HYDROcodone-acetaminophen 10-325 MG tablet     TAKE these medications   cholecalciferol 400 units Tabs tablet Commonly known as:  VITAMIN D Take 400 Units by mouth daily.   diazepam  5 MG tablet Commonly known as:  VALIUM Take 2.5 mg by mouth at bedtime as needed (sleep).   esomeprazole 40 MG capsule Commonly known as:  NEXIUM Take 1 capsule (40 mg total) by mouth daily at 12 noon. What changed:  when to take this   Glatiramer Acetate 40 MG/ML Sosy Commonly known as:  COPAXONE Inject 40 mg into the skin as directed. 3 times a week What changed:  when to take this  additional instructions   HYDROcodone-acetaminophen 10-325 MG tablet Commonly known as:  NORCO Take 1 tablet by mouth every 6 (six) hours as needed (breakthrough pain). Replaces:  HYDROcodone-acetaminophen 5-325 MG tablet   ibuprofen 200 MG tablet Commonly known as:  ADVIL,MOTRIN Take 600 mg by mouth every 8 (eight) hours as needed for moderate pain (for pain.).   losartan 50 MG tablet Commonly known as:  COZAAR Take 75 mg by mouth daily.   methocarbamol 500 MG tablet Commonly known as:   ROBAXIN Take 1 tablet (500 mg total) by mouth every 6 (six) hours as needed for muscle spasms (muscle spasm/pain).   PROBIOTIC PO Take 1 capsule by mouth daily.   SOOTHE XP OP Place 1 drop into both eyes 3 (three) times daily as needed (for dry/irritated eyes).   Vilazodone HCl 10 MG Tabs Commonly known as:  VIIBRYD TAKE 1 TABLET BY MOUTH DAILY OR AS DIRECTED BY DOCTOR What changed:  how much to take  how to take this  when to take this  additional instructions   Vitamin B-12 500 MCG Subl Place under the tongue.            Discharge Care Instructions        Start     Ordered   02/17/17 0000  HYDROcodone-acetaminophen (NORCO) 10-325 MG tablet  Every 6 hours PRN     02/17/17 0809   02/17/17 0000  Call MD / Call 911    Comments:  If you experience chest pain or shortness of breath, CALL 911 and be transported to the hospital emergency room.  If you develope a fever above 101 F, pus (white drainage) or increased drainage or redness at the wound, or calf pain, call your surgeon's office.   02/17/17 0809   02/17/17 0000  Diet - low sodium heart healthy     02/17/17 0809   02/17/17 0000  Constipation Prevention    Comments:  Drink plenty of fluids.  Prune juice may be helpful.  You may use a stool softener, such as Colace (over the counter) 100 mg twice a day.  Use MiraLax (over the counter) for constipation as needed.   02/17/17 0809   02/17/17 0000  Increase activity slowly as tolerated     02/17/17 0809   02/17/17 0000  Discharge instructions    Comments:  Ok for elbow range of motion No lifting with left arm Ok to shower Keep arm in sling   02/17/17 0809      Diagnostic Studies: Dg Wrist Complete Left  Result Date: 02/06/2017 CLINICAL DATA:  Left arm pain.  Status post fall. EXAM: LEFT WRIST - COMPLETE 3+ VIEW COMPARISON:  None. FINDINGS: No fracture or dislocation. Mild osteoarthritis of the scaphotrapeziotrapezoid joint. Moderate-severe osteoarthritis of the  first [place] joint. No focal soft tissue abnormality. IMPRESSION: No acute osseous injury of the left wrist. Electronically Signed   By: Kathreen Devoid   On: 02/06/2017 19:12   Dg Shoulder Left  Result Date: 02/06/2017 CLINICAL DATA:  61 y/o  F; fall with pain. EXAM: LEFT HUMERUS - 2+ VIEW; LEFT SHOULDER - 2+ VIEW COMPARISON:  None. FINDINGS: Left humerus: Comminuted acute oblique fracture of the left humerus proximal diaphysis with 1/2 shaft width lateral displacement of the proximal fracture component. Elbow joint appears maintained. Left shoulder: Comminuted acute oblique fracture of the left proximal humerus diaphysis with 1/2 shaft's width displacement. Shoulder joint appears maintained. IMPRESSION: Comminuted acute oblique fracture of left proximal humerus diaphysis with 1/2 shaft's width displacement of major fracture components. Shoulder and elbow joints appear maintained. Electronically Signed   By: Kristine Garbe M.D.   On: 02/06/2017 19:12   Dg Shoulder Left Port  Result Date: 02/16/2017 CLINICAL DATA:  Proximal humeral fracture.  Postop exam . EXAM: LEFT SHOULDER - 1 VIEW COMPARISON:  02/16/2017 . FINDINGS: Plate and screw fixation of proximal left humeral fracture. Hardware intact. Near anatomic alignment on one view. No other focal abnormality identified. IMPRESSION: ORIF proximal left humeral fracture. Hardware appears intact. Anatomic scratched it near anatomic alignment on one view. Electronically Signed   By: Marcello Moores  Register   On: 02/16/2017 15:58   Dg Humerus Left  Result Date: 02/16/2017 CLINICAL DATA:  Status post ORIF for a left proximal humerus fracture. EXAM: LEFT HUMERUS - 2+ VIEW; DG C-ARM 61-120 MIN COMPARISON:  Preoperative radiograph of the humerus revealing a comminuted displaced fracture of the metadiaphysis. FINDINGS: Reported fluoro time 1 minutes, 39 seconds. The patient has undergone plate and screw fixation of the proximal humeral fracture. Positioning of the  fracture fragments is now more nearly anatomic. The metallic side plate and screws appear intact. IMPRESSION: The patient has undergone successful ORIF for a proximal left humeral fracture. Electronically Signed   By: David  Martinique M.D.   On: 02/16/2017 13:53   Dg Humerus Left  Result Date: 02/11/2017 Clinical:  History of left proximal humerus fracture X-rays were done of the left humerus, three views. There is a co adaption splint in place.  There is a comminuted proximal third humerus fracture with lateral displacement of the proximal portion by 60 degrees.  Humeral head has no fracture.  Bone quality is good. Impression:  Comminuted displaced proximal third humeral fracture. Electronically Signed Sanjuana Kava, MD 8/22/201810:52 AM   Dg Humerus Left  Result Date: 02/06/2017 CLINICAL DATA:  61 y/o  F; fall with pain. EXAM: LEFT HUMERUS - 2+ VIEW; LEFT SHOULDER - 2+ VIEW COMPARISON:  None. FINDINGS: Left humerus: Comminuted acute oblique fracture of the left humerus proximal diaphysis with 1/2 shaft width lateral displacement of the proximal fracture component. Elbow joint appears maintained. Left shoulder: Comminuted acute oblique fracture of the left proximal humerus diaphysis with 1/2 shaft's width displacement. Shoulder joint appears maintained. IMPRESSION: Comminuted acute oblique fracture of left proximal humerus diaphysis with 1/2 shaft's width displacement of major fracture components. Shoulder and elbow joints appear maintained. Electronically Signed   By: Kristine Garbe M.D.   On: 02/06/2017 19:12   Dg C-arm 61-120 Min  Result Date: 02/16/2017 CLINICAL DATA:  Status post ORIF for a left proximal humerus fracture. EXAM: LEFT HUMERUS - 2+ VIEW; DG C-ARM 61-120 MIN COMPARISON:  Preoperative radiograph of the humerus revealing a comminuted displaced fracture of the metadiaphysis. FINDINGS: Reported fluoro time 1 minutes, 39 seconds. The patient has undergone plate and screw fixation of  the proximal humeral fracture. Positioning of the fracture fragments is now more nearly anatomic. The metallic side plate and screws appear intact. IMPRESSION: The patient has undergone successful ORIF for a proximal  left humeral fracture. Electronically Signed   By: David  Martinique M.D.   On: 02/16/2017 13:53    Disposition: 01-Home or Self Care  Discharge Instructions    Call MD / Call 911    Complete by:  As directed    If you experience chest pain or shortness of breath, CALL 911 and be transported to the hospital emergency room.  If you develope a fever above 101 F, pus (white drainage) or increased drainage or redness at the wound, or calf pain, call your surgeon's office.   Constipation Prevention    Complete by:  As directed    Drink plenty of fluids.  Prune juice may be helpful.  You may use a stool softener, such as Colace (over the counter) 100 mg twice a day.  Use MiraLax (over the counter) for constipation as needed.   Diet - low sodium heart healthy    Complete by:  As directed    Discharge instructions    Complete by:  As directed    Ok for elbow range of motion No lifting with left arm Ok to shower Keep arm in sling   Increase activity slowly as tolerated    Complete by:  As directed          Signed: Anderson Malta 02/18/2017, 12:31 PM

## 2017-02-25 ENCOUNTER — Ambulatory Visit (INDEPENDENT_AMBULATORY_CARE_PROVIDER_SITE_OTHER): Payer: Medicare Other

## 2017-02-25 ENCOUNTER — Ambulatory Visit (INDEPENDENT_AMBULATORY_CARE_PROVIDER_SITE_OTHER): Payer: Medicare Other | Admitting: Orthopedic Surgery

## 2017-02-25 ENCOUNTER — Encounter (INDEPENDENT_AMBULATORY_CARE_PROVIDER_SITE_OTHER): Payer: Self-pay | Admitting: Orthopedic Surgery

## 2017-02-25 DIAGNOSIS — S42202D Unspecified fracture of upper end of left humerus, subsequent encounter for fracture with routine healing: Secondary | ICD-10-CM

## 2017-02-25 NOTE — Progress Notes (Signed)
   Post-Op Visit Note   Patient: [name]           Date of Birth: 1955-07-17           MRN: [id number] Visit Date: 02/25/2017 PCP: Sharilyn Sites, MD   Assessment & Plan:  Chief Complaint:  Chief Complaint  Patient presents with  . Routine Post Op    ORIF Left Humerus on 02/16/17   Visit Diagnoses:  1. Closed fracture of proximal end of left humerus with routine healing, unspecified fracture morphology, subsequent encounter     Plan: [name] is a 61 year old patient left humerus fracture fixation.  She's doing well.  On exam incision is intact.  Motor sensory function to the hand is intact with intact EPL FPL interosseous function.  Deltoid fires.  Radiographs look good.  Plan at this time is to have her work on elbow motion.  Her elbows, little stiff in the first week postop.  No lifting with the left arm.  Physical therapy for elbow range of motion only.  Two-week return for clinical recheck and initiation of shoulder range of motion exercises passive only  Follow-Up Instructions: Return in about 2 weeks (around 03/11/2017).   Orders:  Orders Placed This Encounter  Procedures  . XR Humerus Left  . Ambulatory referral to Physical Therapy   No orders of the defined types were placed in this encounter.   Imaging: Xr Humerus Left  Result Date: 02/25/2017 2 views left humerus reviewed.  Proximal humerus fracture fixation in good position and alignment.  No evidence of hardware failure or complication.  Mild soft tissue swelling present   PMFS History: Patient Active Problem List   Diagnosis Date Noted  . Proximal humerus fracture 02/16/2017  . Insomnia, controlled 01/06/2017  . MS (multiple sclerosis) ([place]) 12/09/2013   Past Medical History:  Diagnosis Date  . Arthritis    Osteoarthritis  . Colon polyps   . GERD (gastroesophageal reflux disease)   . Hiatal hernia   . Hypertension   . MS (multiple sclerosis) ([place]) 12/09/2013  . Multiple sclerosis ([place])   .  Seizures ([place]) 01/04/2005   1 and Only    Family History  Problem Relation Age of Onset  . Stomach cancer Father   . Sick sinus syndrome Father   . Colon cancer Neg Hx     Past Surgical History:  Procedure Laterality Date  . COLONOSCOPY    . COLONOSCOPY N/A 09/16/2012   Procedure: COLONOSCOPY;  Surgeon: Rogene Houston, MD;  Location: [facility];  Service: Endoscopy;  Laterality: N/A;  1030-rescheduled to 9:30 Ann notified pt  . DILATION AND CURETTAGE OF UTERUS    . ORIF HUMERUS FRACTURE Left 02/16/2017   Procedure: OPEN REDUCTION INTERNAL FIXATION (ORIF) LEFT PROXIMAL HUMERUS FRACTURE;  Surgeon: Meredith Pel, MD;  Location: [facility];  Service: Orthopedics;  Laterality: Left;   Social History   Occupational History  .  Disabled   Social History Main Topics  . Smoking status: Current Every Day Smoker    Packs/day: 0.75    Years: 30.00    Types: Cigarettes  . Smokeless tobacco: Never Used  . Alcohol use 4.8 oz/week    8 Cans of beer per week  . Drug use: No  . Sexual activity: Not on file

## 2017-02-27 ENCOUNTER — Other Ambulatory Visit: Payer: Self-pay | Admitting: Neurology

## 2017-02-27 DIAGNOSIS — K2100 Gastro-esophageal reflux disease with esophagitis, without bleeding: Secondary | ICD-10-CM

## 2017-02-27 DIAGNOSIS — G4700 Insomnia, unspecified: Secondary | ICD-10-CM

## 2017-02-27 DIAGNOSIS — G35 Multiple sclerosis: Secondary | ICD-10-CM

## 2017-02-27 NOTE — Addendum Note (Signed)
Addended by: Laurann Montana on: 02/27/2017 10:26 AM   Modules accepted: Orders

## 2017-03-02 ENCOUNTER — Ambulatory Visit: Payer: Medicare Other | Admitting: Orthopedic Surgery

## 2017-03-02 ENCOUNTER — Ambulatory Visit (HOSPITAL_COMMUNITY): Payer: Medicare Other | Attending: Family Medicine

## 2017-03-02 DIAGNOSIS — M25612 Stiffness of left shoulder, not elsewhere classified: Secondary | ICD-10-CM | POA: Diagnosis present

## 2017-03-02 DIAGNOSIS — R29898 Other symptoms and signs involving the musculoskeletal system: Secondary | ICD-10-CM | POA: Diagnosis present

## 2017-03-02 DIAGNOSIS — M25512 Pain in left shoulder: Secondary | ICD-10-CM | POA: Insufficient documentation

## 2017-03-02 DIAGNOSIS — R600 Localized edema: Secondary | ICD-10-CM | POA: Diagnosis present

## 2017-03-02 DIAGNOSIS — M25622 Stiffness of left elbow, not elsewhere classified: Secondary | ICD-10-CM | POA: Diagnosis not present

## 2017-03-02 DIAGNOSIS — M25522 Pain in left elbow: Secondary | ICD-10-CM | POA: Insufficient documentation

## 2017-03-02 NOTE — Patient Instructions (Signed)
AROM: Wrist Extension   With right palm down, bend wrist up. Repeat ____ times per set. Do ____ sets per session. Do ____ sessions per day.  Copyright  VHI. All rights reserved.   AROM: Wrist Flexion   With right palm up, bend wrist up. Repeat ____ times per set. Do ____ sets per session. Do ____ sessions per day.  Copyright  VHI. All rights reserved.   AROM: Forearm Pronation / Supination   With right arm in handshake position, slowly rotate palm down until stretch is felt. Relax. Then rotate palm up until stretch is felt. Repeat ____ times per set. Do ____ sets per session. Do ____ sessions per day.   ELBOW FLEXION EXTENSION  Bend your elbow upwards as shown and then lower to a straighten position.    *Use ice for swelling as much as needed. 10 minutes on and 10 minutes. Use as much as needed throughout the day. At least 3-5 times.

## 2017-03-02 NOTE — Therapy (Signed)
[place], [place], [address] Phone: [phone]   Fax:  [phone]  Occupational Therapy Evaluation  Patient Details  Name: [name] MRN: [id number] Date of Birth: 07-29-1955 Referring Provider: Dr. Marcene Duos  Encounter Date: 03/02/2017      OT End of Session - 03/02/17 1635    Visit Number 1   Number of Visits 45   Date for OT Re-Evaluation 05/01/17  mini reassess: 03/30/17   Authorization Type [employer] medicare   Authorization Time Period before 10th visit   Authorization - Visit Number 1   Authorization - Number of Visits 10   OT Start Time 1430   OT Stop Time 1520   OT Time Calculation (min) 50 min   Activity Tolerance Patient tolerated treatment well   Behavior During Therapy [facility] for tasks assessed/performed      Past Medical History:  Diagnosis Date  . Arthritis    Osteoarthritis  . Colon polyps   . GERD (gastroesophageal reflux disease)   . Hiatal hernia   . Hypertension   . MS (multiple sclerosis) ([place]) 12/09/2013  . Multiple sclerosis ([place])   . Seizures ([place]) 01/04/2005   1 and Only    Past Surgical History:  Procedure Laterality Date  . COLONOSCOPY    . COLONOSCOPY N/A 09/16/2012   Procedure: COLONOSCOPY;  Surgeon: Rogene Houston, MD;  [facility];  Service: Endoscopy;  Laterality: N/A;  1030-rescheduled to 9:30 Ann notified pt  . DILATION AND CURETTAGE OF UTERUS    . ORIF HUMERUS FRACTURE Left 02/16/2017   Procedure: OPEN REDUCTION INTERNAL FIXATION (ORIF) LEFT PROXIMAL HUMERUS FRACTURE;  Surgeon: Meredith Pel, MD;  Location: [place];  Service: Orthopedics;  Laterality: Left;    There were no vitals filed for this visit.      Subjective Assessment - 03/02/17 1441    Subjective  S: This is the first time I have ever broken a bone.   Pertinent History Patient is a 61 y/o female S/P left humerus fracture sustained from a mechanical fall on 02/06/17. Patient underwent  surgery on 02/16/17 and is currently in a sling 24/7. Dr. Marlou Sa has referred her to occupational therapy for evaluation and treatment.    Special Tests FOTO score: 39/100   Patient Stated Goals to regain full use of her arm.   Currently in Pain? No/denies           [facility] OT Assessment - 03/02/17 1443      Assessment   Diagnosis Left shoulder humerus fracture fixation   Referring Provider Dr. Marcene Duos   Onset Date 02/16/17  surgery   Assessment Follow up appointment: 03/11/17   Prior Therapy OT evaluation in acute     Precautions   Precautions Shoulder   Type of Shoulder Precautions Complete elbow ROM exercises only until patient's follow up appointment with Dr. Marlou Sa on 03/11/17 and then she will be cleared to begin P/ROM shoulder exercises.    Required Braces or Orthoses Sling     Restrictions   Weight Bearing Restrictions Yes   LUE Weight Bearing Non weight bearing     Balance Screen   Has the patient fallen in the past 6 months Yes   How many times? [place] expects to be discharged to: Private residence   Type of Home --  [place] With --  1 [place]  Prior Function   Level of Independence Independent   Vocation Retired     ADL   ADL comments Pt is unable to complete any ADL tasks using her LUE at this time.      Mobility   Mobility Status History of falls     Written Expression   Dominant Hand Right     Vision - History   Baseline Vision Wears glasses only for reading     Cognition   Overall Cognitive Status Within Functional Limits for tasks assessed     Edema   Edema Left wrist: 16cm, forearm proximal to elbow: 23.5cm, elbow (at -68 degrees from 0): 28cm, upper arm/deltoid: 30 cm. Right wrist: 15 cm, forearm proximla to elbow: 22.0cm, elbow (extended): 23cm, upper arm/deltoid: 27cm     ROM / Strength   AROM / PROM / Strength PROM;Strength     Palpation   Palpation comment Max fascial restrictions  in left forearm up to shoulder region. hard edema in elbow region.      PROM   Overall PROM Comments Shoulder P/ROM will be measured after follow up appointment with Dr. Marlou Sa next week.   PROM Assessment Site Elbow;Forearm;Wrist   Right/Left Elbow Left   Left Elbow Flexion 120   Left Elbow Extension -68   Right/Left Forearm Left   Left Forearm Pronation 90 Degrees   Left Forearm Supination 90 Degrees   Right/Left Wrist Left   Left Wrist Extension --  WFL   Left Wrist Flexion --  [facility]     Strength   Overall Strength Unable to assess;Due to precautions   Strength Assessment Site Elbow;Hand;Forearm   Right/Left hand Left;Right   Right Hand Grip (lbs) 65   Left Hand Grip (lbs) 30                         OT Education - 03/02/17 1634    Education provided Yes   Education Details Elbow A/ROM exercises (flexion/extension, wrist flexion and extension, forearm supination/prontation, yellow theraputty. Education on elevating Left arm above heart when able. Use of ice to decrease swelling.    Person(s) Educated Patient   Methods Explanation;Demonstration;Verbal cues;Handout   Comprehension Returned demonstration;Verbalized understanding          OT Short Term Goals - 03/02/17 1645      OT SHORT TERM GOAL #1   Title Patient will be educated and independent with HEP to faciliate progress in therapy and return to use LUE for daily tasks.    Time 6   Period Weeks   Status New   Target Date 04/13/17     OT SHORT TERM GOAL #2   Title Patient will decrease edema in LUE by 1 cm or more as needed to increase functional mobility needed to complete light housekeeping tasks.    Time 6   Period Weeks   Status New     OT SHORT TERM GOAL #3   Title Patient will decrease fascial restrictions in LUE to mod amount to increase functional mobility needed to complete dressing activities.   Time 6   Period Weeks   Status New     OT SHORT TERM GOAL #4   Title Patient will  increase LUE strength (shoulder and elbow) to 4-/5 to increase ability to complete household tasks below shoulder level.    Time 6   Period Weeks   Status New     OT SHORT TERM GOAL #5   Title  Patient will report a pain score of 5/10 or less when using LUE for daily tasks.    Time 6   Status New     Additional Short Term Goals   Additional Short Term Goals Yes     OT SHORT TERM GOAL #6   Title Patient will increase P/ROM of LUE (shoulder and elbow) to [facility] to increase ability to complete dressing tasks.    Time 6   Period Weeks   Status New           OT Long Term Goals - 03/02/17 1648      OT LONG TERM GOAL #1   Title Patient will return to highest level of independence with all daily tasks while using LUE.    Time 12   Period Weeks   Status New   Target Date 05/25/17     OT LONG TERM GOAL #2   Title Patient will decrease edema in LUE by 2-3 cm in order to increase functional mobility needed for overhead reaching tasks.    Time 12   Period Weeks   Status New     OT LONG TERM GOAL #3   Title Patient will decrease fascial restrictions in LUE to min amount or less to increase functional mobility needed to complete dressing tasks such as taking bra on and off.   Time 12   Period Weeks   Status New     OT LONG TERM GOAL #4   Title Patient will increase LUE strength (shoulder and elbow) to 4/5 to increase ability to return to normal household and daily lifting tasks.    Time 12   Period Weeks   Status New     OT LONG TERM GOAL #5   Title Patient will increase A/ROM of LUE (shoulder and elbow) to [facility] to increase ability to complete tasks overhead easier.    Time 12   Period Weeks   Status New     Long Term Additional Goals   Additional Long Term Goals Yes     OT LONG TERM GOAL #6   Title Patient will report a pain level of 3/10 when completing daily tasks.    Time 12   Period Weeks   Status New               Plan - 03/02/17 1636    Clinical  Impression Statement A: patient is a 61 y/o female S/P left proximal humerus fracture repair causing increased swelling, pain with movement, fascial restrictions, and decreased strength and ROM of LUE resulting in difficulty completing daily tasks.    Occupational Profile and client history currently impacting functional performance Patient was independent prior to injury, motivated to return to PLOF, strong social support at home   Occupational performance deficits (Please refer to evaluation for details): ADL's;IADL's;Rest and Sleep;Play;Leisure   Rehab Potential Excellent   Current Impairments/barriers affecting progress: hx of MS   OT Frequency --  2x a week for 2 weeks then increasing to 3 times for 12 weeks.   OT Duration 12 weeks   OT Treatment/Interventions Self-care/ADL training;Ultrasound;Manual Therapy;Splinting;Patient/family education;Therapeutic exercise;Iontophoresis;Therapeutic activities;Compression bandaging;DME and/or AE instruction;Parrafin;Cryotherapy;Electrical Stimulation;Moist Heat;Contrast Bath;Passive range of motion;Scar mobilization   Plan P: Pt will benefit from skilled OT services to increase functional use of LUE during daily tasks. Treatment Plan: Focus only on decreasing edema in LUE. Complete ROM exercises to left wrist and elbow. Grip strengthening as needed. Once patient completes follow up appointment with Dr. Marlou Sa then take  shoulder measurements, add shoulder goals, and complete myofascial release, manual stretching, P/ROM, AA/ROM, A/ROM, and general shoulder and elbow strengthening.    Clinical Decision Making Several treatment options, min-mod task modification necessary   OT Home Exercise Plan 9/10: elbow exercises, yellow theraputty, edema management   Consulted and Agree with Plan of Care Patient      Patient will benefit from skilled therapeutic intervention in order to improve the following deficits and impairments:  Decreased scar mobility, Increased  edema, Decreased range of motion, Increased fascial restricitons, Impaired UE functional use, Pain, Decreased strength, Decreased knowledge of use of DME  Visit Diagnosis: Stiffness of left elbow, not elsewhere classified - Plan: Ot plan of care cert/re-cert  Stiffness of left shoulder, not elsewhere classified - Plan: Ot plan of care cert/re-cert  Other symptoms and signs involving the musculoskeletal system - Plan: Ot plan of care cert/re-cert  Pain in left elbow - Plan: Ot plan of care cert/re-cert  Acute pain of left shoulder - Plan: Ot plan of care cert/re-cert  Localized edema - Plan: Ot plan of care cert/re-cert      G-Codes - 33/00/76 1652    Functional Assessment Tool Used (Outpatient only) FOTO score: 39/100 (61% impaired)   Functional Limitation Self care   Self Care Current Status (A2633) At least 60 percent but less than 80 percent impaired, limited or restricted   Self Care Goal Status (H5456) At least 20 percent but less than 40 percent impaired, limited or restricted      Problem List Patient Active Problem List   Diagnosis Date Noted  . Proximal humerus fracture 02/16/2017  . Insomnia, controlled 01/06/2017  . MS (multiple sclerosis) ([place]) 12/09/2013   Ailene Ravel, OTR/L,CBIS  [phone]  03/02/2017, 4:54 PM  [facility] [address] [place], [place], [address] Phone: [phone]   Fax:  [phone]  Name: [name] MRN: [id number] Date of Birth: Apr 22, 1956

## 2017-03-04 ENCOUNTER — Ambulatory Visit (HOSPITAL_COMMUNITY): Payer: Medicare Other

## 2017-03-04 ENCOUNTER — Encounter (HOSPITAL_COMMUNITY): Payer: Self-pay

## 2017-03-04 DIAGNOSIS — M25512 Pain in left shoulder: Secondary | ICD-10-CM

## 2017-03-04 DIAGNOSIS — M25522 Pain in left elbow: Secondary | ICD-10-CM

## 2017-03-04 DIAGNOSIS — M25622 Stiffness of left elbow, not elsewhere classified: Secondary | ICD-10-CM

## 2017-03-04 DIAGNOSIS — R29898 Other symptoms and signs involving the musculoskeletal system: Secondary | ICD-10-CM

## 2017-03-04 DIAGNOSIS — R600 Localized edema: Secondary | ICD-10-CM

## 2017-03-04 DIAGNOSIS — M25612 Stiffness of left shoulder, not elsewhere classified: Secondary | ICD-10-CM

## 2017-03-04 NOTE — Therapy (Signed)
[name], [place], [address] Phone: [phone]   Fax:  [phone]  Occupational Therapy Treatment  Patient Details  Name: [name] MRN: [id number] Date of Birth: 1955-08-31 Referring Provider: Dr. Marcene Duos  Encounter Date: 03/04/2017      OT End of Session - 03/04/17 1729    Visit Number 2   Number of Visits 36   Date for OT Re-Evaluation 05/01/17  mini reassess: 03/30/17   Authorization Type [employer] medicare   Authorization Time Period before 10th visit   Authorization - Visit Number 2   Authorization - Number of Visits 10   OT Start Time 0160   OT Stop Time 1435   OT Time Calculation (min) 50 min   Activity Tolerance Patient tolerated treatment well   Behavior During Therapy [facility] for tasks assessed/performed      Past Medical History:  Diagnosis Date  . Arthritis    Osteoarthritis  . Colon polyps   . GERD (gastroesophageal reflux disease)   . Hiatal hernia   . Hypertension   . MS (multiple sclerosis) ([place]) 12/09/2013  . Multiple sclerosis ([place])   . Seizures ([place]) 01/04/2005   1 and Only    Past Surgical History:  Procedure Laterality Date  . COLONOSCOPY    . COLONOSCOPY N/A 09/16/2012   Procedure: COLONOSCOPY;  Surgeon: Rogene Houston, MD;  [facility];  Service: Endoscopy;  Laterality: N/A;  1030-rescheduled to 9:30 Ann notified pt  . DILATION AND CURETTAGE OF UTERUS    . ORIF HUMERUS FRACTURE Left 02/16/2017   Procedure: OPEN REDUCTION INTERNAL FIXATION (ORIF) LEFT PROXIMAL HUMERUS FRACTURE;  Surgeon: Meredith Pel, MD;  Location: [place];  Service: Orthopedics;  Laterality: Left;    There were no vitals filed for this visit.          [facility] OT Assessment - 03/04/17 1727      Assessment   Diagnosis Left shoulder humerus fracture fixation     Precautions   Precautions Shoulder   Type of Shoulder Precautions Complete elbow ROM exercises only until patient's follow  up appointment with Dr. Marlou Sa on 03/11/17 and then she will be cleared to begin P/ROM shoulder exercises.      PROM   Left Elbow Extension -42  eval 9/10: -68                  OT Treatments/Exercises (OP) - 03/04/17 1727      Exercises   Exercises Elbow     Elbow Exercises   Elbow Flexion PROM;10 reps   Elbow Extension PROM;10 reps     Manual Therapy   Manual Therapy Myofascial release;Edema management   Manual therapy comments Manual therapy completed prior to exercises   Edema Management Edema management techniques completed to LUE from wrist up to shoulder.   Myofascial Release Myofascial release and manual stretching completed to left elbow region to decrease fascial restrictions and increase joint mobility in a pain free zone.                 OT Education - 03/04/17 1729    Education provided Yes   Education Details Pt given OT evaluation for goals and plan of care reference.    Person(s) Educated Patient   Methods Explanation;Handout   Comprehension Verbalized understanding          OT Short Term Goals - 03/04/17 1732      OT SHORT TERM GOAL #  1   Title Patient will be educated and independent with HEP to faciliate progress in therapy and return to use LUE for daily tasks.    Time 6   Period Weeks   Status On-going     OT SHORT TERM GOAL #2   Title Patient will decrease edema in LUE by 1 cm or more as needed to increase functional mobility needed to complete light housekeeping tasks.    Time 6   Period Weeks   Status On-going     OT SHORT TERM GOAL #3   Title Patient will decrease fascial restrictions in LUE to mod amount to increase functional mobility needed to complete dressing activities.   Time 6   Period Weeks   Status On-going     OT SHORT TERM GOAL #4   Title Patient will increase LUE strength (shoulder and elbow) to 4-/5 to increase ability to complete household tasks below shoulder level.    Time 6   Period Weeks   Status  On-going     OT SHORT TERM GOAL #5   Title Patient will report a pain score of 5/10 or less when using LUE for daily tasks.    Time 6   Status On-going     OT SHORT TERM GOAL #6   Title Patient will increase P/ROM of LUE (shoulder and elbow) to [facility] to increase ability to complete dressing tasks.    Time 6   Period Weeks   Status On-going           OT Long Term Goals - 03/04/17 1732      OT LONG TERM GOAL #1   Title Patient will return to highest level of independence with all daily tasks while using LUE.    Time 12   Period Weeks   Status On-going     OT LONG TERM GOAL #2   Title Patient will decrease edema in LUE by 2-3 cm in order to increase functional mobility needed for overhead reaching tasks.    Time 12   Period Weeks   Status On-going     OT LONG TERM GOAL #3   Title Patient will decrease fascial restrictions in LUE to min amount or less to increase functional mobility needed to complete dressing tasks such as taking bra on and off.   Time 12   Period Weeks   Status On-going     OT LONG TERM GOAL #4   Title Patient will increase LUE strength (shoulder and elbow) to 4/5 to increase ability to return to normal household and daily lifting tasks.    Time 12   Period Weeks   Status On-going     OT LONG TERM GOAL #5   Title Patient will increase A/ROM of LUE (shoulder and elbow) to [facility] to increase ability to complete tasks overhead easier.    Time 12   Period Weeks   Status On-going     OT LONG TERM GOAL #6   Title Patient will report a pain level of 3/10 when completing daily tasks.    Time 12   Period Weeks   Status On-going               Plan - 03/04/17 1730    Clinical Impression Statement A: Initiated myofascial release and manual stretching of left elbow region only per MD at this time. Therapist was able to passively stretch patient into -42 elbow extension this date. Edema is noticeably decreased from evaluation. Pt reports  that she is  completing her HEP at least 2 times a day at this time.    Plan P: Continue with myofascial release and manual stretching to left elbow. Edema management techniques.  Add A/ROM elbow.      Patient will benefit from skilled therapeutic intervention in order to improve the following deficits and impairments:  Decreased scar mobility, Increased edema, Decreased range of motion, Increased fascial restricitons, Impaired UE functional use, Pain, Decreased strength, Decreased knowledge of use of DME  Visit Diagnosis: Stiffness of left elbow, not elsewhere classified  Stiffness of left shoulder, not elsewhere classified  Other symptoms and signs involving the musculoskeletal system  Pain in left elbow  Acute pain of left shoulder  Localized edema    Problem List Patient Active Problem List   Diagnosis Date Noted  . Proximal humerus fracture 02/16/2017  . Insomnia, controlled 01/06/2017  . MS (multiple sclerosis) ([place]) 12/09/2013   Ailene Ravel, OTR/L,CBIS  [phone]  03/04/2017, 5:33 PM  [facility] [address] [place], [place], [address] Phone: [phone]   Fax:  [phone]  Name: [name] MRN: [id number] Date of Birth: October 17, 1955

## 2017-03-06 ENCOUNTER — Ambulatory Visit (HOSPITAL_COMMUNITY)
Admission: RE | Admit: 2017-03-06 | Discharge: 2017-03-06 | Disposition: A | Discharge status: Home / Self Care | Payer: Medicare Other | Source: Ambulatory Visit | Attending: Family Medicine | Admitting: Family Medicine

## 2017-03-06 ENCOUNTER — Other Ambulatory Visit (HOSPITAL_COMMUNITY): Payer: Self-pay | Admitting: Family Medicine

## 2017-03-06 DIAGNOSIS — M7989 Other specified soft tissue disorders: Secondary | ICD-10-CM | POA: Diagnosis present

## 2017-03-06 DIAGNOSIS — I82431 Acute embolism and thrombosis of right popliteal vein: Secondary | ICD-10-CM | POA: Insufficient documentation

## 2017-03-06 DIAGNOSIS — I82819 Embolism and thrombosis of superficial veins of unspecified lower extremities: Secondary | ICD-10-CM | POA: Insufficient documentation

## 2017-03-09 ENCOUNTER — Ambulatory Visit (HOSPITAL_COMMUNITY): Payer: Medicare Other

## 2017-03-09 ENCOUNTER — Encounter (HOSPITAL_COMMUNITY): Payer: Self-pay

## 2017-03-09 ENCOUNTER — Telehealth (HOSPITAL_COMMUNITY): Payer: Self-pay

## 2017-03-09 ENCOUNTER — Telehealth (INDEPENDENT_AMBULATORY_CARE_PROVIDER_SITE_OTHER): Payer: Self-pay

## 2017-03-09 ENCOUNTER — Telehealth (INDEPENDENT_AMBULATORY_CARE_PROVIDER_SITE_OTHER): Payer: Self-pay | Admitting: Orthopedic Surgery

## 2017-03-09 DIAGNOSIS — M25612 Stiffness of left shoulder, not elsewhere classified: Secondary | ICD-10-CM

## 2017-03-09 DIAGNOSIS — M25512 Pain in left shoulder: Secondary | ICD-10-CM

## 2017-03-09 DIAGNOSIS — M25622 Stiffness of left elbow, not elsewhere classified: Secondary | ICD-10-CM | POA: Diagnosis not present

## 2017-03-09 DIAGNOSIS — R29898 Other symptoms and signs involving the musculoskeletal system: Secondary | ICD-10-CM

## 2017-03-09 DIAGNOSIS — M25522 Pain in left elbow: Secondary | ICD-10-CM

## 2017-03-09 DIAGNOSIS — R600 Localized edema: Secondary | ICD-10-CM

## 2017-03-09 NOTE — Telephone Encounter (Signed)
Patient called this morning, wanted to let Dr Marlou Sa know that her PCP sent her for u/s r/o DVT last Friday due to RLE pain and tenderness she had been experiencing. According to patient she was diagnosed with DVT in RLE and her PCP started her on xarelto. She wanted to make sure that Dr Marlou Sa was aware. I relayed information to him.

## 2017-03-09 NOTE — Therapy (Signed)
[place], [place], [address] Phone: [phone]   Fax:  [phone]  Occupational Therapy Treatment  Patient Details  Name: [name] MRN: [id number] Date of Birth: [birth year] Referring Provider: Dr. Marcene Duos  Encounter Date: 03/09/2017      OT End of Session - 03/09/17 1235    Visit Number 3   Number of Visits 36   Date for OT Re-Evaluation 05/01/17  mini reassess: 03/30/17   Authorization Type [employer] medicare   Authorization Time Period before 10th visit   Authorization - Visit Number 3   Authorization - Number of Visits 10   OT Start Time 1117   OT Stop Time 1200   OT Time Calculation (min) 43 min   Activity Tolerance Patient tolerated treatment well   Behavior During Therapy [facility] for tasks assessed/performed      Past Medical History:  Diagnosis Date  . Arthritis    Osteoarthritis  . Colon polyps   . GERD (gastroesophageal reflux disease)   . Hiatal hernia   . Hypertension   . MS (multiple sclerosis) ([place]) 12/09/2013  . Multiple sclerosis ([place])   . Seizures ([place]) 01/04/2005   1 and Only    Past Surgical History:  Procedure Laterality Date  . COLONOSCOPY    . COLONOSCOPY N/A 09/16/2012   Procedure: COLONOSCOPY;  Surgeon: Rogene Houston, MD;  [facility];  Service: Endoscopy;  Laterality: N/A;  1030-rescheduled to 9:30 Ann notified pt  . DILATION AND CURETTAGE OF UTERUS    . ORIF HUMERUS FRACTURE Left 02/16/2017   Procedure: OPEN REDUCTION INTERNAL FIXATION (ORIF) LEFT PROXIMAL HUMERUS FRACTURE;  Surgeon: Meredith Pel, MD;  Location: [place];  Service: Orthopedics;  Laterality: Left;    There were no vitals filed for this visit.      Subjective Assessment - 03/09/17 1232    Subjective  S: I found out I have blood clot in my right leg. So I have to be on a medication for 6 months.   Currently in Pain? No/denies            [facility] OT Assessment - 03/09/17 1233       Assessment   Diagnosis Left shoulder humerus fracture fixation     Precautions   Precautions Shoulder   Type of Shoulder Precautions Complete elbow ROM exercises only until patient's follow up appointment with Dr. Marlou Sa on 03/11/17 and then she will be cleared to begin P/ROM shoulder exercises.      PROM   Left Elbow Extension -30  previous: -59                  OT Treatments/Exercises (OP) - 03/09/17 1234      Exercises   Exercises Elbow     Elbow Exercises   Elbow Flexion PROM;AROM;10 reps   Elbow Extension PROM;AROM;10 reps   Forearm Supination PROM;AROM;10 reps   Forearm Pronation PROM;AROM;10 reps     Manual Therapy   Manual Therapy Myofascial release;Edema management   Manual therapy comments Manual therapy completed prior to exercises   Edema Management Edema management techniques completed to LUE from wrist up to shoulder.   Myofascial Release Myofascial release and manual stretching completed to left elbow region to decrease fascial restrictions and increase joint mobility in a pain free zone.                   OT Short Term Goals - 03/04/17 1732  OT SHORT TERM GOAL #1   Title Patient will be educated and independent with HEP to faciliate progress in therapy and return to use LUE for daily tasks.    Time 6   Period Weeks   Status On-going     OT SHORT TERM GOAL #2   Title Patient will decrease edema in LUE by 1 cm or more as needed to increase functional mobility needed to complete light housekeeping tasks.    Time 6   Period Weeks   Status On-going     OT SHORT TERM GOAL #3   Title Patient will decrease fascial restrictions in LUE to mod amount to increase functional mobility needed to complete dressing activities.   Time 6   Period Weeks   Status On-going     OT SHORT TERM GOAL #4   Title Patient will increase LUE strength (shoulder and elbow) to 4-/5 to increase ability to complete household tasks below shoulder level.    Time 6    Period Weeks   Status On-going     OT SHORT TERM GOAL #5   Title Patient will report a pain score of 5/10 or less when using LUE for daily tasks.    Time 6   Status On-going     OT SHORT TERM GOAL #6   Title Patient will increase P/ROM of LUE (shoulder and elbow) to [facility] to increase ability to complete dressing tasks.    Time 6   Period Weeks   Status On-going           OT Long Term Goals - 03/04/17 1732      OT LONG TERM GOAL #1   Title Patient will return to highest level of independence with all daily tasks while using LUE.    Time 12   Period Weeks   Status On-going     OT LONG TERM GOAL #2   Title Patient will decrease edema in LUE by 2-3 cm in order to increase functional mobility needed for overhead reaching tasks.    Time 12   Period Weeks   Status On-going     OT LONG TERM GOAL #3   Title Patient will decrease fascial restrictions in LUE to min amount or less to increase functional mobility needed to complete dressing tasks such as taking bra on and off.   Time 12   Period Weeks   Status On-going     OT LONG TERM GOAL #4   Title Patient will increase LUE strength (shoulder and elbow) to 4/5 to increase ability to return to normal household and daily lifting tasks.    Time 12   Period Weeks   Status On-going     OT LONG TERM GOAL #5   Title Patient will increase A/ROM of LUE (shoulder and elbow) to [facility] to increase ability to complete tasks overhead easier.    Time 12   Period Weeks   Status On-going     OT LONG TERM GOAL #6   Title Patient will report a pain level of 3/10 when completing daily tasks.    Time 12   Period Weeks   Status On-going               Plan - 03/09/17 1235    Clinical Impression Statement A: Patient continues to make significant progress with increasing elbow extension and decreasing edema in LUE. Therapist was able to passively stretch patient to -30 degrees elbow extension this date. Patient reports that she  continues to complete her HEP as much as possible although this weekend was hard due to her multiple MD appointments due to her new DVT in RLE.    Plan P: MD appointment on 03/11/17. Follow up on MD appointment. Unless MD states otherwise, take P/ROM measurements of Left shoulder and provide patient a HEP for shoulder. Continue to work on increasing elbow extension while incorporating her left shoulder into treatment.      Patient will benefit from skilled therapeutic intervention in order to improve the following deficits and impairments:  Decreased scar mobility, Increased edema, Decreased range of motion, Increased fascial restricitons, Impaired UE functional use, Pain, Decreased strength, Decreased knowledge of use of DME  Visit Diagnosis: Stiffness of left elbow, not elsewhere classified  Stiffness of left shoulder, not elsewhere classified  Other symptoms and signs involving the musculoskeletal system  Pain in left elbow  Acute pain of left shoulder  Localized edema    Problem List Patient Active Problem List   Diagnosis Date Noted  . Proximal humerus fracture 02/16/2017  . Insomnia, controlled 01/06/2017  . MS (multiple sclerosis) ([place]) 12/09/2013   Ailene Ravel, OTR/L,CBIS  [phone]  03/09/2017, 12:41 PM  [place] [address] [place], [place], [address] Phone: [phone]   Fax:  [phone]  Name: [name] MRN: [id number] Date of Birth: 05/19/1956

## 2017-03-09 NOTE — Telephone Encounter (Signed)
Pt called Friday to say she has a blood clot and she will still be here for her next appointment. NF

## 2017-03-11 ENCOUNTER — Ambulatory Visit (INDEPENDENT_AMBULATORY_CARE_PROVIDER_SITE_OTHER): Payer: Medicare Other | Admitting: Orthopedic Surgery

## 2017-03-11 ENCOUNTER — Encounter (INDEPENDENT_AMBULATORY_CARE_PROVIDER_SITE_OTHER): Payer: Self-pay | Admitting: Orthopedic Surgery

## 2017-03-11 ENCOUNTER — Encounter (HOSPITAL_COMMUNITY): Payer: Medicare Other

## 2017-03-11 DIAGNOSIS — S42202D Unspecified fracture of upper end of left humerus, subsequent encounter for fracture with routine healing: Secondary | ICD-10-CM

## 2017-03-12 NOTE — Progress Notes (Signed)
   Post-Op Visit Note   Patient: [name]           Date of Birth: 1955-09-20           MRN: [id number] Visit Date: 03/11/2017 PCP: Sharilyn Sites, MD   Assessment & Plan:  Chief Complaint:  Chief Complaint  Patient presents with  . Follow-up    LT ORIF humerus on 02/16/17   Visit Diagnoses:  1. Closed fracture of proximal end of left humerus with routine healing, unspecified fracture morphology, subsequent encounter     Plan: [name] is a 61 year old patient who is now 3 weeks out left proximal humerus fracture fixation.  Since I have seen her she has actually been diagnosed with DVT in the right lower extremity by her astute primary care physician.  She has been placed on Xarelto.  She's doing well with her left shoulder.  On examination her elbow extension has improved.  Deltoid is functional.  Plan at this time is to discontinue the sling but no lifting with that left arm.  I want her to continue to work on achieving more full elbow extension.  Note is provided a physical therapy.  3 week return for clinical recheck and repeat radiographs.  They start strengthening at that time.  Follow-Up Instructions: Return in about 3 weeks (around 04/01/2017).   Orders:  No orders of the defined types were placed in this encounter.  No orders of the defined types were placed in this encounter.   Imaging: No results found.  PMFS History: Patient Active Problem List   Diagnosis Date Noted  . Proximal humerus fracture 02/16/2017  . Insomnia, controlled 01/06/2017  . MS (multiple sclerosis) ([place]) 12/09/2013   Past Medical History:  Diagnosis Date  . Arthritis    Osteoarthritis  . Colon polyps   . GERD (gastroesophageal reflux disease)   . Hiatal hernia   . Hypertension   . MS (multiple sclerosis) ([place]) 12/09/2013  . Multiple sclerosis ([place])   . Seizures ([place]) 01/04/2005   1 and Only    Family History  Problem Relation Age of Onset  . Stomach cancer Father   . Sick sinus  syndrome Father   . Colon cancer Neg Hx     Past Surgical History:  Procedure Laterality Date  . COLONOSCOPY    . COLONOSCOPY N/A 09/16/2012   Procedure: COLONOSCOPY;  Surgeon: Rogene Houston, MD;  [facility];  Service: Endoscopy;  Laterality: N/A;  1030-rescheduled to 9:30 Ann notified pt  . DILATION AND CURETTAGE OF UTERUS    . ORIF HUMERUS FRACTURE Left 02/16/2017   Procedure: OPEN REDUCTION INTERNAL FIXATION (ORIF) LEFT PROXIMAL HUMERUS FRACTURE;  Surgeon: Meredith Pel, MD;  Location: [facility];  Service: Orthopedics;  Laterality: Left;   Social History   Occupational History  .  Disabled   Social History Main Topics  . Smoking status: Current Every Day Smoker    Packs/day: 0.75    Years: 30.00    Types: Cigarettes  . Smokeless tobacco: Never Used  . Alcohol use 4.8 oz/week    8 Cans of beer per week  . Drug use: No  . Sexual activity: Not on file

## 2017-03-13 ENCOUNTER — Ambulatory Visit (HOSPITAL_COMMUNITY): Payer: Medicare Other | Admitting: Occupational Therapy

## 2017-03-13 ENCOUNTER — Encounter (HOSPITAL_COMMUNITY): Payer: Self-pay | Admitting: Occupational Therapy

## 2017-03-13 DIAGNOSIS — M25622 Stiffness of left elbow, not elsewhere classified: Secondary | ICD-10-CM | POA: Diagnosis not present

## 2017-03-13 DIAGNOSIS — M25512 Pain in left shoulder: Secondary | ICD-10-CM

## 2017-03-13 DIAGNOSIS — R29898 Other symptoms and signs involving the musculoskeletal system: Secondary | ICD-10-CM

## 2017-03-13 DIAGNOSIS — M25522 Pain in left elbow: Secondary | ICD-10-CM

## 2017-03-13 DIAGNOSIS — M25612 Stiffness of left shoulder, not elsewhere classified: Secondary | ICD-10-CM

## 2017-03-13 NOTE — Therapy (Signed)
[place], [place], [address] Phone: [phone]   Fax:  [phone]  Occupational Therapy Treatment  Patient Details  Name: [name] MRN: [id number] Date of Birth: 26-Jan-1956 Referring Provider: Dr. Marcene Duos  Encounter Date: 03/13/2017      OT End of Session - 03/13/17 1112    Visit Number 4   Number of Visits 36   Date for OT Re-Evaluation 05/01/17  mini reassess: 03/30/17   Authorization Type [employer] medicare   Authorization Time Period before 10th visit   Authorization - Visit Number 4   Authorization - Number of Visits 10   OT Start Time 1025   OT Stop Time 1112   OT Time Calculation (min) 47 min   Activity Tolerance Patient tolerated treatment well   Behavior During Therapy [facility] for tasks assessed/performed      Past Medical History:  Diagnosis Date  . Arthritis    Osteoarthritis  . Colon polyps   . GERD (gastroesophageal reflux disease)   . Hiatal hernia   . Hypertension   . MS (multiple sclerosis) ([place]) 12/09/2013  . Multiple sclerosis ([place])   . Seizures ([place]) 01/04/2005   1 and Only    Past Surgical History:  Procedure Laterality Date  . COLONOSCOPY    . COLONOSCOPY N/A 09/16/2012   Procedure: COLONOSCOPY;  Surgeon: Rogene Houston, MD;  [facility];  Service: Endoscopy;  Laterality: N/A;  1030-rescheduled to 9:30 Ann notified pt  . DILATION AND CURETTAGE OF UTERUS    . ORIF HUMERUS FRACTURE Left 02/16/2017   Procedure: OPEN REDUCTION INTERNAL FIXATION (ORIF) LEFT PROXIMAL HUMERUS FRACTURE;  Surgeon: Meredith Pel, MD;  Location: [place];  Service: Orthopedics;  Laterality: Left;    There were no vitals filed for this visit.      Subjective Assessment - 03/13/17 1024    Subjective  S: The doctor said don't touch this shoulder.   Currently in Pain? No/denies            [facility] OT Assessment - 03/13/17 1023      Assessment   Diagnosis Left shoulder humerus  fracture fixation     Precautions   Precautions Shoulder   Type of Shoulder Precautions 9/21: d/c sling, continue working on elbow ROM-especially extension. No strengthening shoulder muscles.                   OT Treatments/Exercises (OP) - 03/13/17 1035      Exercises   Exercises Elbow     Elbow Exercises   Elbow Flexion PROM;AROM;10 reps   Elbow Extension PROM;AROM;10 reps   Forearm Supination PROM;AROM;10 reps   Forearm Pronation PROM;AROM;10 reps     Manual Therapy   Manual Therapy Myofascial release;Edema management   Manual therapy comments Manual therapy completed prior to exercises   Edema Management Edema management techniques completed to LUE from wrist up to shoulder.   Myofascial Release Myofascial release and manual stretching completed to left elbow region to decrease fascial restrictions and increase joint mobility in a pain free zone.                   OT Short Term Goals - 03/04/17 1732      OT SHORT TERM GOAL #1   Title Patient will be educated and independent with HEP to faciliate progress in therapy and return to use LUE for daily tasks.    Time 6   Period  Weeks   Status On-going     OT SHORT TERM GOAL #2   Title Patient will decrease edema in LUE by 1 cm or more as needed to increase functional mobility needed to complete light housekeeping tasks.    Time 6   Period Weeks   Status On-going     OT SHORT TERM GOAL #3   Title Patient will decrease fascial restrictions in LUE to mod amount to increase functional mobility needed to complete dressing activities.   Time 6   Period Weeks   Status On-going     OT SHORT TERM GOAL #4   Title Patient will increase LUE strength (shoulder and elbow) to 4-/5 to increase ability to complete household tasks below shoulder level.    Time 6   Period Weeks   Status On-going     OT SHORT TERM GOAL #5   Title Patient will report a pain score of 5/10 or less when using LUE for daily tasks.     Time 6   Status On-going     OT SHORT TERM GOAL #6   Title Patient will increase P/ROM of LUE (shoulder and elbow) to [facility] to increase ability to complete dressing tasks.    Time 6   Period Weeks   Status On-going           OT Long Term Goals - 03/04/17 1732      OT LONG TERM GOAL #1   Title Patient will return to highest level of independence with all daily tasks while using LUE.    Time 12   Period Weeks   Status On-going     OT LONG TERM GOAL #2   Title Patient will decrease edema in LUE by 2-3 cm in order to increase functional mobility needed for overhead reaching tasks.    Time 12   Period Weeks   Status On-going     OT LONG TERM GOAL #3   Title Patient will decrease fascial restrictions in LUE to min amount or less to increase functional mobility needed to complete dressing tasks such as taking bra on and off.   Time 12   Period Weeks   Status On-going     OT LONG TERM GOAL #4   Title Patient will increase LUE strength (shoulder and elbow) to 4/5 to increase ability to return to normal household and daily lifting tasks.    Time 12   Period Weeks   Status On-going     OT LONG TERM GOAL #5   Title Patient will increase A/ROM of LUE (shoulder and elbow) to [facility] to increase ability to complete tasks overhead easier.    Time 12   Period Weeks   Status On-going     OT LONG TERM GOAL #6   Title Patient will report a pain level of 3/10 when completing daily tasks.    Time 12   Period Weeks   Status On-going               Plan - 03/13/17 1112    Clinical Impression Statement A: Pt reports MD does not want anything done with the shoulder at this time. MD rx states no strengthening shoulder muscles yet. Continued with manual therapy and P/ROM working on increasing elbow extension and decreasing edema and fascial restrictions.    Plan P: Call MD office and clarify if P/ROM to shoulder can be initiated or if needs to be held until next appt on 10/10. continue  with  manual therapy   OT Home Exercise Plan 9/10: elbow exercises, yellow theraputty, edema management   Consulted and Agree with Plan of Care Patient      Patient will benefit from skilled therapeutic intervention in order to improve the following deficits and impairments:  Decreased scar mobility, Increased edema, Decreased range of motion, Increased fascial restricitons, Impaired UE functional use, Pain, Decreased strength, Decreased knowledge of use of DME  Visit Diagnosis: Stiffness of left elbow, not elsewhere classified  Stiffness of left shoulder, not elsewhere classified  Other symptoms and signs involving the musculoskeletal system  Pain in left elbow  Acute pain of left shoulder    Problem List Patient Active Problem List   Diagnosis Date Noted  . Proximal humerus fracture 02/16/2017  . Insomnia, controlled 01/06/2017  . MS (multiple sclerosis) ([place]) 12/09/2013   Guadelupe Sabin, OTR/L  [phone] 03/13/2017, 11:14 AM  [place] [address] [place], [place], [address] Phone: [phone]   Fax:  [phone]  Name: LILINOE ACKLIN MRN: [id number] Date of Birth: 1956-06-01

## 2017-03-16 ENCOUNTER — Ambulatory Visit (HOSPITAL_COMMUNITY): Payer: Medicare Other | Admitting: Specialist

## 2017-03-18 ENCOUNTER — Encounter (HOSPITAL_COMMUNITY): Payer: Self-pay

## 2017-03-18 ENCOUNTER — Ambulatory Visit (HOSPITAL_COMMUNITY): Payer: Medicare Other

## 2017-03-18 DIAGNOSIS — M25622 Stiffness of left elbow, not elsewhere classified: Secondary | ICD-10-CM | POA: Diagnosis not present

## 2017-03-18 DIAGNOSIS — M25522 Pain in left elbow: Secondary | ICD-10-CM

## 2017-03-18 DIAGNOSIS — R29898 Other symptoms and signs involving the musculoskeletal system: Secondary | ICD-10-CM

## 2017-03-18 DIAGNOSIS — M25512 Pain in left shoulder: Secondary | ICD-10-CM

## 2017-03-18 DIAGNOSIS — R600 Localized edema: Secondary | ICD-10-CM

## 2017-03-18 DIAGNOSIS — M25612 Stiffness of left shoulder, not elsewhere classified: Secondary | ICD-10-CM

## 2017-03-18 NOTE — Therapy (Signed)
[place], [place], [address] Phone: [phone]   Fax:  [phone]  Occupational Therapy Treatment  Patient Details  Name: [name] MRN: [id number] Date of Birth: 10/02/1955 Referring Provider: Dr. Marcene Duos  Encounter Date: 03/18/2017      OT End of Session - 03/18/17 1344    Visit Number 5   Number of Visits 36   Date for OT Re-Evaluation 05/01/17  mini reassess: 03/30/17   Authorization Type [employer] medicare   Authorization Time Period before 10th visit   Authorization - Visit Number 5   Authorization - Number of Visits 10   OT Start Time 1301   OT Stop Time 1339   OT Time Calculation (min) 38 min   Activity Tolerance Patient tolerated treatment well   Behavior During Therapy [facility] for tasks assessed/performed      Past Medical History:  Diagnosis Date  . Arthritis    Osteoarthritis  . Colon polyps   . GERD (gastroesophageal reflux disease)   . Hiatal hernia   . Hypertension   . MS (multiple sclerosis) ([place]) 12/09/2013  . Multiple sclerosis ([place])   . Seizures ([place]) 01/04/2005   1 and Only    Past Surgical History:  Procedure Laterality Date  . COLONOSCOPY    . COLONOSCOPY N/A 09/16/2012   Procedure: COLONOSCOPY;  Surgeon: Rogene Houston, MD;  [facility];  Service: Endoscopy;  Laterality: N/A;  1030-rescheduled to 9:30 Ann notified pt  . DILATION AND CURETTAGE OF UTERUS    . ORIF HUMERUS FRACTURE Left 02/16/2017   Procedure: OPEN REDUCTION INTERNAL FIXATION (ORIF) LEFT PROXIMAL HUMERUS FRACTURE;  Surgeon: Meredith Pel, MD;  Location: [place];  Service: Orthopedics;  Laterality: Left;    There were no vitals filed for this visit.      Subjective Assessment - 03/18/17 1343    Subjective  S: I can't put anything on this scar.   Currently in Pain? No/denies            [facility] OT Assessment - 03/18/17 1343      Assessment   Diagnosis Left shoulder humerus fracture  fixation     Precautions   Precautions Shoulder   Type of Shoulder Precautions 9/21: d/c sling, continue working on elbow ROM-especially extension. No strengthening shoulder muscles.      PROM   Left Elbow Extension -20  previous: -30                  OT Treatments/Exercises (OP) - 03/18/17 1343      Exercises   Exercises Elbow     Elbow Exercises   Elbow Flexion PROM;AROM;10 reps   Elbow Extension PROM;AROM;10 reps   Forearm Supination PROM;AROM;10 reps   Forearm Pronation PROM;AROM;10 reps     Manual Therapy   Manual Therapy Myofascial release;Edema management   Manual therapy comments Manual therapy completed prior to exercises   Edema Management Edema management techniques completed to LUE from wrist up to shoulder.   Myofascial Release Myofascial release and manual stretching completed to left elbow region to decrease fascial restrictions and increase joint mobility in a pain free zone.                   OT Short Term Goals - 03/04/17 1732      OT SHORT TERM GOAL #1   Title Patient will be educated and independent with HEP to faciliate progress in therapy and return  to use LUE for daily tasks.    Time 6   Period Weeks   Status On-going     OT SHORT TERM GOAL #2   Title Patient will decrease edema in LUE by 1 cm or more as needed to increase functional mobility needed to complete light housekeeping tasks.    Time 6   Period Weeks   Status On-going     OT SHORT TERM GOAL #3   Title Patient will decrease fascial restrictions in LUE to mod amount to increase functional mobility needed to complete dressing activities.   Time 6   Period Weeks   Status On-going     OT SHORT TERM GOAL #4   Title Patient will increase LUE strength (shoulder and elbow) to 4-/5 to increase ability to complete household tasks below shoulder level.    Time 6   Period Weeks   Status On-going     OT SHORT TERM GOAL #5   Title Patient will report a pain score of 5/10  or less when using LUE for daily tasks.    Time 6   Status On-going     OT SHORT TERM GOAL #6   Title Patient will increase P/ROM of LUE (shoulder and elbow) to [facility] to increase ability to complete dressing tasks.    Time 6   Period Weeks   Status On-going           OT Long Term Goals - 03/04/17 1732      OT LONG TERM GOAL #1   Title Patient will return to highest level of independence with all daily tasks while using LUE.    Time 12   Period Weeks   Status On-going     OT LONG TERM GOAL #2   Title Patient will decrease edema in LUE by 2-3 cm in order to increase functional mobility needed for overhead reaching tasks.    Time 12   Period Weeks   Status On-going     OT LONG TERM GOAL #3   Title Patient will decrease fascial restrictions in LUE to min amount or less to increase functional mobility needed to complete dressing tasks such as taking bra on and off.   Time 12   Period Weeks   Status On-going     OT LONG TERM GOAL #4   Title Patient will increase LUE strength (shoulder and elbow) to 4/5 to increase ability to return to normal household and daily lifting tasks.    Time 12   Period Weeks   Status On-going     OT LONG TERM GOAL #5   Title Patient will increase A/ROM of LUE (shoulder and elbow) to [facility] to increase ability to complete tasks overhead easier.    Time 12   Period Weeks   Status On-going     OT LONG TERM GOAL #6   Title Patient will report a pain level of 3/10 when completing daily tasks.    Time 12   Period Weeks   Status On-going               Plan - 03/18/17 1344    Clinical Impression Statement A: Revised patient's schedule to continue with twice a week until we are able to add shoulder to our therapy sessions. Patient was able to achieve passive elbow extension to -20 degrees this session. Pt contiues to have great response to manual techniques used to increase elbow ROM.    Plan P: Attempt moist heat to  elbow prior to myofascial  release and manual stretching to increase elbow ROM.       Patient will benefit from skilled therapeutic intervention in order to improve the following deficits and impairments:  Decreased scar mobility, Increased edema, Decreased range of motion, Increased fascial restricitons, Impaired UE functional use, Pain, Decreased strength, Decreased knowledge of use of DME  Visit Diagnosis: Stiffness of left elbow, not elsewhere classified  Stiffness of left shoulder, not elsewhere classified  Other symptoms and signs involving the musculoskeletal system  Pain in left elbow  Acute pain of left shoulder  Localized edema    Problem List Patient Active Problem List   Diagnosis Date Noted  . Proximal humerus fracture 02/16/2017  . Insomnia, controlled 01/06/2017  . MS (multiple sclerosis) ([place]) 12/09/2013   Ailene Ravel, OTR/L,CBIS  [phone]  03/18/2017, 1:46 PM  [place] [address] [place], [place], [address] Phone: [phone]   Fax:  [phone]  Name: [name] MRN: [id number] Date of Birth: 05/06/1956

## 2017-03-20 ENCOUNTER — Encounter (HOSPITAL_COMMUNITY): Payer: Self-pay

## 2017-03-20 ENCOUNTER — Ambulatory Visit (HOSPITAL_COMMUNITY): Payer: Medicare Other

## 2017-03-20 DIAGNOSIS — M25522 Pain in left elbow: Secondary | ICD-10-CM

## 2017-03-20 DIAGNOSIS — R600 Localized edema: Secondary | ICD-10-CM

## 2017-03-20 DIAGNOSIS — M25612 Stiffness of left shoulder, not elsewhere classified: Secondary | ICD-10-CM

## 2017-03-20 DIAGNOSIS — M25622 Stiffness of left elbow, not elsewhere classified: Secondary | ICD-10-CM | POA: Diagnosis not present

## 2017-03-20 DIAGNOSIS — M25512 Pain in left shoulder: Secondary | ICD-10-CM

## 2017-03-20 DIAGNOSIS — R29898 Other symptoms and signs involving the musculoskeletal system: Secondary | ICD-10-CM

## 2017-03-20 NOTE — Therapy (Signed)
[place], [place], [address] Phone: [phone]   Fax:  [phone]  Occupational Therapy Treatment  Patient Details  Name: [name] MRN: [id number] Date of Birth: [birth year] Referring Provider: Dr. Marcene Duos  Encounter Date: 03/20/2017      OT End of Session - 03/20/17 1502    Visit Number 6   Number of Visits 40   Date for OT Re-Evaluation 05/01/17  mini reassess: 03/30/17   Authorization Type [employer] medicare   Authorization Time Period before 10th visit   Authorization - Visit Number 6   Authorization - Number of Visits 10   OT Start Time 8366   OT Stop Time 1115   OT Time Calculation (min) 35 min   Activity Tolerance Patient tolerated treatment well   Behavior During Therapy [facility] for tasks assessed/performed      Past Medical History:  Diagnosis Date  . Arthritis    Osteoarthritis  . Colon polyps   . GERD (gastroesophageal reflux disease)   . Hiatal hernia   . Hypertension   . MS (multiple sclerosis) ([place]) 12/09/2013  . Multiple sclerosis ([place])   . Seizures ([place]) 01/04/2005   1 and Only    Past Surgical History:  Procedure Laterality Date  . COLONOSCOPY    . COLONOSCOPY N/A 09/16/2012   Procedure: COLONOSCOPY;  Surgeon: Rogene Houston, MD;  [facility];  Service: Endoscopy;  Laterality: N/A;  1030-rescheduled to 9:30 Ann notified pt  . DILATION AND CURETTAGE OF UTERUS    . ORIF HUMERUS FRACTURE Left 02/16/2017   Procedure: OPEN REDUCTION INTERNAL FIXATION (ORIF) LEFT PROXIMAL HUMERUS FRACTURE;  Surgeon: Meredith Pel, MD;  Location: [place];  Service: Orthopedics;  Laterality: Left;    There were no vitals filed for this visit.      Subjective Assessment - 03/20/17 1456    Subjective  S: I've been really working on it at home. Yesterday I was really sore.    Currently in Pain? Yes   Pain Score 4    Pain Location Arm   Pain Orientation Left   Pain Descriptors /  Indicators Sore   Pain Type Acute pain   Pain Radiating Towards N/A   Pain Onset In the past 7 days   Pain Frequency Occasional   Aggravating Factors  Movement   Pain Relieving Factors rest   Effect of Pain on Daily Activities Unable to use left arm for any tasks at this time.   Multiple Pain Sites No            [facility] OT Assessment - 03/20/17 1459      Assessment   Diagnosis Left shoulder humerus fracture fixation     Precautions   Precautions Shoulder   Type of Shoulder Precautions 9/21: d/c sling, continue working on elbow ROM-especially extension. No strengthening shoulder muscles.      PROM   Left Elbow Extension -18  previous: -20                  OT Treatments/Exercises (OP) - 03/20/17 1500      Exercises   Exercises Elbow     Elbow Exercises   Elbow Flexion PROM;AROM;10 reps   Elbow Extension PROM;AROM;10 reps   Forearm Supination PROM;AROM;10 reps   Forearm Pronation PROM;AROM;10 reps     Modalities   Modalities Moist Heat     Moist Heat Therapy   Number Minutes Moist Heat 10 Minutes  Moist Heat Location Elbow     Manual Therapy   Manual Therapy Myofascial release;Edema management   Manual therapy comments Manual therapy completed prior to exercises   Edema Management Edema management techniques completed to LUE from wrist up to shoulder.   Myofascial Release Myofascial release and manual stretching completed to left elbow region to decrease fascial restrictions and increase joint mobility in a pain free zone.                 OT Education - 03/20/17 1501    Education provided Yes   Education Details Educated patient on what to expect when we are able to work on her shoulder in therapy. Pt was given red theraputty for hand strengthening.    Person(s) Educated Patient   Methods Explanation   Comprehension Verbalized understanding          OT Short Term Goals - 03/04/17 1732      OT SHORT TERM GOAL #1   Title Patient will  be educated and independent with HEP to faciliate progress in therapy and return to use LUE for daily tasks.    Time 6   Period Weeks   Status On-going     OT SHORT TERM GOAL #2   Title Patient will decrease edema in LUE by 1 cm or more as needed to increase functional mobility needed to complete light housekeeping tasks.    Time 6   Period Weeks   Status On-going     OT SHORT TERM GOAL #3   Title Patient will decrease fascial restrictions in LUE to mod amount to increase functional mobility needed to complete dressing activities.   Time 6   Period Weeks   Status On-going     OT SHORT TERM GOAL #4   Title Patient will increase LUE strength (shoulder and elbow) to 4-/5 to increase ability to complete household tasks below shoulder level.    Time 6   Period Weeks   Status On-going     OT SHORT TERM GOAL #5   Title Patient will report a pain score of 5/10 or less when using LUE for daily tasks.    Time 6   Status On-going     OT SHORT TERM GOAL #6   Title Patient will increase P/ROM of LUE (shoulder and elbow) to [facility] to increase ability to complete dressing tasks.    Time 6   Period Weeks   Status On-going           OT Long Term Goals - 03/04/17 1732      OT LONG TERM GOAL #1   Title Patient will return to highest level of independence with all daily tasks while using LUE.    Time 12   Period Weeks   Status On-going     OT LONG TERM GOAL #2   Title Patient will decrease edema in LUE by 2-3 cm in order to increase functional mobility needed for overhead reaching tasks.    Time 12   Period Weeks   Status On-going     OT LONG TERM GOAL #3   Title Patient will decrease fascial restrictions in LUE to min amount or less to increase functional mobility needed to complete dressing tasks such as taking bra on and off.   Time 12   Period Weeks   Status On-going     OT LONG TERM GOAL #4   Title Patient will increase LUE strength (shoulder and elbow) to 4/5 to increase  ability to return to normal household and daily lifting tasks.    Time 12   Period Weeks   Status On-going     OT LONG TERM GOAL #5   Title Patient will increase A/ROM of LUE (shoulder and elbow) to [facility] to increase ability to complete tasks overhead easier.    Time 12   Period Weeks   Status On-going     OT LONG TERM GOAL #6   Title Patient will report a pain level of 3/10 when completing daily tasks.    Time 12   Period Weeks   Status On-going               Plan - 03/20/17 1502    Clinical Impression Statement A: Patient making gains with P/ROM elbow extension. used moist heat at beginning of session to help increase elbow extension.    Plan P: Continue with moist heat prior to myofascial release. Continue to focus on increasing elbow extension.      Patient will benefit from skilled therapeutic intervention in order to improve the following deficits and impairments:  Decreased scar mobility, Increased edema, Decreased range of motion, Increased fascial restricitons, Impaired UE functional use, Pain, Decreased strength, Decreased knowledge of use of DME  Visit Diagnosis: Stiffness of left elbow, not elsewhere classified  Stiffness of left shoulder, not elsewhere classified  Other symptoms and signs involving the musculoskeletal system  Pain in left elbow  Acute pain of left shoulder  Localized edema    Problem List Patient Active Problem List   Diagnosis Date Noted  . Proximal humerus fracture 02/16/2017  . Insomnia, controlled 01/06/2017  . MS (multiple sclerosis) ([place]) 12/09/2013   Ailene Ravel, OTR/L,CBIS  [phone]  03/20/2017, 3:04 PM  [place] [address] [place], [place], [address] Phone: [phone]   Fax:  [phone]  Name: [name] MRN: [id number] Date of Birth: Apr 17, 1956

## 2017-03-23 ENCOUNTER — Telehealth (INDEPENDENT_AMBULATORY_CARE_PROVIDER_SITE_OTHER): Payer: Self-pay | Admitting: Orthopedic Surgery

## 2017-03-23 ENCOUNTER — Encounter (HOSPITAL_COMMUNITY): Payer: Medicare Other

## 2017-03-23 MED ORDER — HYDROCODONE-ACETAMINOPHEN 5-325 MG PO TABS: 1.0000 | ORAL_TABLET | Freq: Four times a day (QID) | ORAL | 0 refills | Status: DC | PRN

## 2017-03-23 NOTE — Telephone Encounter (Signed)
Ok to rf? 

## 2017-03-23 NOTE — Telephone Encounter (Signed)
y

## 2017-03-23 NOTE — Telephone Encounter (Signed)
IC s/w patient and advised could pick up at front desk.

## 2017-03-23 NOTE — Telephone Encounter (Signed)
Pt need refill Hydrocodone   *pt has six left*

## 2017-03-25 ENCOUNTER — Ambulatory Visit (HOSPITAL_COMMUNITY): Payer: Medicare Other | Attending: Family Medicine

## 2017-03-25 ENCOUNTER — Encounter (HOSPITAL_COMMUNITY): Payer: Self-pay

## 2017-03-25 DIAGNOSIS — M25612 Stiffness of left shoulder, not elsewhere classified: Secondary | ICD-10-CM | POA: Diagnosis present

## 2017-03-25 DIAGNOSIS — R600 Localized edema: Secondary | ICD-10-CM

## 2017-03-25 DIAGNOSIS — M25622 Stiffness of left elbow, not elsewhere classified: Secondary | ICD-10-CM | POA: Insufficient documentation

## 2017-03-25 DIAGNOSIS — M25522 Pain in left elbow: Secondary | ICD-10-CM | POA: Diagnosis present

## 2017-03-25 DIAGNOSIS — R29898 Other symptoms and signs involving the musculoskeletal system: Secondary | ICD-10-CM | POA: Insufficient documentation

## 2017-03-25 DIAGNOSIS — M25512 Pain in left shoulder: Secondary | ICD-10-CM | POA: Diagnosis present

## 2017-03-25 NOTE — Therapy (Signed)
[place], [place], [address] Phone: [phone]   Fax:  [phone]  Occupational Therapy Treatment  Patient Details  Name: [name] MRN: [id number] Date of Birth: 04-14-1956 Referring Provider: Dr. Marcene Duos  Encounter Date: 03/25/2017      OT End of Session - 03/25/17 1416    Visit Number 7   Number of Visits 36   Date for OT Re-Evaluation 05/01/17  mini reassess: 03/30/17   Authorization Type [employer] medicare   Authorization Time Period before 10th visit   Authorization - Visit Number 7   Authorization - Number of Visits 10   OT Start Time 3329   OT Stop Time 1345   OT Time Calculation (min) 38 min   Activity Tolerance Patient tolerated treatment well   Behavior During Therapy [facility] for tasks assessed/performed      Past Medical History:  Diagnosis Date  . Arthritis    Osteoarthritis  . Colon polyps   . GERD (gastroesophageal reflux disease)   . Hiatal hernia   . Hypertension   . MS (multiple sclerosis) ([place]) 12/09/2013  . Multiple sclerosis ([place])   . Seizures ([place]) 01/04/2005   1 and Only    Past Surgical History:  Procedure Laterality Date  . COLONOSCOPY    . COLONOSCOPY N/A 09/16/2012   Procedure: COLONOSCOPY;  Surgeon: Rogene Houston, MD;  [facility];  Service: Endoscopy;  Laterality: N/A;  1030-rescheduled to 9:30 Ann notified pt  . DILATION AND CURETTAGE OF UTERUS    . ORIF HUMERUS FRACTURE Left 02/16/2017   Procedure: OPEN REDUCTION INTERNAL FIXATION (ORIF) LEFT PROXIMAL HUMERUS FRACTURE;  Surgeon: Meredith Pel, MD;  Location: [place];  Service: Orthopedics;  Laterality: Left;    There were no vitals filed for this visit.      Subjective Assessment - 03/25/17 1339    Subjective  S: It's just sore up in  my arm today.   Currently in Pain? Yes   Pain Score 3    Pain Location Arm   Pain Orientation Left   Pain Descriptors / Indicators Sore   Pain Type Acute pain             [facility] OT Assessment - 03/25/17 1340      Assessment   Diagnosis Left shoulder humerus fracture fixation     Precautions   Precautions Shoulder   Type of Shoulder Precautions 9/21: d/c sling, continue working on elbow ROM-especially extension. No strengthening shoulder muscles.                   OT Treatments/Exercises (OP) - 03/25/17 1341      Exercises   Exercises Elbow     Elbow Exercises   Elbow Flexion PROM;AROM;10 reps   Elbow Extension PROM;AROM;10 reps   Forearm Supination PROM;AROM;10 reps   Forearm Pronation PROM;AROM;10 reps     Modalities   Modalities Moist Heat     Moist Heat Therapy   Number Minutes Moist Heat 10 Minutes   Moist Heat Location Elbow     Manual Therapy   Manual Therapy Myofascial release;Edema management;Muscle Energy Technique   Manual therapy comments Manual therapy completed prior to exercises   Edema Management Edema management techniques completed to LUE from wrist up to shoulder.   Myofascial Release Myofascial release and manual stretching completed to left elbow region to decrease fascial restrictions and increase joint mobility in a pain free zone.  Muscle Energy Technique Muscle energy technique completed to left elbow to relax tone and muscle spasm and improve range of motion.                     OT Short Term Goals - 03/04/17 1732      OT SHORT TERM GOAL #1   Title Patient will be educated and independent with HEP to faciliate progress in therapy and return to use LUE for daily tasks.    Time 6   Period Weeks   Status On-going     OT SHORT TERM GOAL #2   Title Patient will decrease edema in LUE by 1 cm or more as needed to increase functional mobility needed to complete light housekeeping tasks.    Time 6   Period Weeks   Status On-going     OT SHORT TERM GOAL #3   Title Patient will decrease fascial restrictions in LUE to mod amount to increase functional mobility needed to complete  dressing activities.   Time 6   Period Weeks   Status On-going     OT SHORT TERM GOAL #4   Title Patient will increase LUE strength (shoulder and elbow) to 4-/5 to increase ability to complete household tasks below shoulder level.    Time 6   Period Weeks   Status On-going     OT SHORT TERM GOAL #5   Title Patient will report a pain score of 5/10 or less when using LUE for daily tasks.    Time 6   Status On-going     OT SHORT TERM GOAL #6   Title Patient will increase P/ROM of LUE (shoulder and elbow) to [facility] to increase ability to complete dressing tasks.    Time 6   Period Weeks   Status On-going           OT Long Term Goals - 03/04/17 1732      OT LONG TERM GOAL #1   Title Patient will return to highest level of independence with all daily tasks while using LUE.    Time 12   Period Weeks   Status On-going     OT LONG TERM GOAL #2   Title Patient will decrease edema in LUE by 2-3 cm in order to increase functional mobility needed for overhead reaching tasks.    Time 12   Period Weeks   Status On-going     OT LONG TERM GOAL #3   Title Patient will decrease fascial restrictions in LUE to min amount or less to increase functional mobility needed to complete dressing tasks such as taking bra on and off.   Time 12   Period Weeks   Status On-going     OT LONG TERM GOAL #4   Title Patient will increase LUE strength (shoulder and elbow) to 4/5 to increase ability to return to normal household and daily lifting tasks.    Time 12   Period Weeks   Status On-going     OT LONG TERM GOAL #5   Title Patient will increase A/ROM of LUE (shoulder and elbow) to [facility] to increase ability to complete tasks overhead easier.    Time 12   Period Weeks   Status On-going     OT LONG TERM GOAL #6   Title Patient will report a pain level of 3/10 when completing daily tasks.    Time 12   Period Weeks   Status On-going  Plan - 03/25/17 1417    Clinical  Impression Statement A: Completed muscle energy technique this session to increase elbow extension and patient had great results. Continues to respond well to myofascial release and manual stretching.    Plan P: Continue with moist heat prior to manual therapy. Work on increasing elbow extension to full range within patient's tolerance.       Patient will benefit from skilled therapeutic intervention in order to improve the following deficits and impairments:  Decreased scar mobility, Increased edema, Decreased range of motion, Increased fascial restricitons, Impaired UE functional use, Pain, Decreased strength, Decreased knowledge of use of DME  Visit Diagnosis: Stiffness of left elbow, not elsewhere classified  Stiffness of left shoulder, not elsewhere classified  Other symptoms and signs involving the musculoskeletal system  Pain in left elbow  Acute pain of left shoulder  Localized edema    Problem List Patient Active Problem List   Diagnosis Date Noted  . Proximal humerus fracture 02/16/2017  . Insomnia, controlled 01/06/2017  . MS (multiple sclerosis) ([place]) 12/09/2013   Ailene Ravel, OTR/L,CBIS  [phone]  03/25/2017, 2:23 PM  [place] [address] [place], [place], [address] Phone: [phone]   Fax:  [phone]  Name: STEVEN VEAZIE MRN: [id number] Date of Birth: [birth year]

## 2017-03-27 ENCOUNTER — Ambulatory Visit (HOSPITAL_COMMUNITY): Payer: Medicare Other

## 2017-03-27 DIAGNOSIS — R29898 Other symptoms and signs involving the musculoskeletal system: Secondary | ICD-10-CM

## 2017-03-27 DIAGNOSIS — M25612 Stiffness of left shoulder, not elsewhere classified: Secondary | ICD-10-CM

## 2017-03-27 DIAGNOSIS — M25512 Pain in left shoulder: Secondary | ICD-10-CM

## 2017-03-27 DIAGNOSIS — R600 Localized edema: Secondary | ICD-10-CM

## 2017-03-27 DIAGNOSIS — M25622 Stiffness of left elbow, not elsewhere classified: Secondary | ICD-10-CM | POA: Diagnosis not present

## 2017-03-27 DIAGNOSIS — M25522 Pain in left elbow: Secondary | ICD-10-CM

## 2017-03-29 ENCOUNTER — Encounter (HOSPITAL_COMMUNITY): Payer: Self-pay

## 2017-03-29 NOTE — Therapy (Signed)
[place], [place], [address] Phone: [phone]   Fax:  [phone]  Occupational Therapy Treatment  Patient Details  Name: [name] MRN: [id number] Date of Birth: 1956/01/15 Referring Provider: Dr. Marcene Duos  Encounter Date: 03/27/2017     03/27/17 1710  OT Visits / Re-Eval  Visit Number 8  Number of Visits 36  Date for OT Re-Evaluation 05/01/17 (mini reassess: 03/30/17)  Authorization  Authorization Type [employer] medicare  Authorization Time Period before 10th visit  Authorization - Visit Number 8  Authorization - Number of Visits 10  OT Time Calculation  OT Start Time 1115  OT Stop Time 1200  OT Time Calculation (min) 45 min  End of Session  Activity Tolerance Patient tolerated treatment well  Behavior During Therapy [facility] for tasks assessed/performed    Past Medical History:  Diagnosis Date  . Arthritis    Osteoarthritis  . Colon polyps   . GERD (gastroesophageal reflux disease)   . Hiatal hernia   . Hypertension   . MS (multiple sclerosis) ([place]) 12/09/2013  . Multiple sclerosis ([place])   . Seizures ([place]) 01/04/2005   1 and Only    Past Surgical History:  Procedure Laterality Date  . COLONOSCOPY    . COLONOSCOPY N/A 09/16/2012   Procedure: COLONOSCOPY;  Surgeon: Rogene Houston, MD;  [facility];  Service: Endoscopy;  Laterality: N/A;  1030-rescheduled to 9:30 Ann notified pt  . DILATION AND CURETTAGE OF UTERUS    . ORIF HUMERUS FRACTURE Left 02/16/2017   Procedure: OPEN REDUCTION INTERNAL FIXATION (ORIF) LEFT PROXIMAL HUMERUS FRACTURE;  Surgeon: Meredith Pel, MD;  Location: [place];  Service: Orthopedics;  Laterality: Left;    There were no vitals filed for this visit.      03/27/17 1708  Assessment  Diagnosis Left shoulder humerus fracture fixation  Precautions  Precautions Shoulder  Type of Shoulder Precautions 9/21: d/c sling, continue working on elbow ROM-especially  extension. No strengthening shoulder muscles.         03/27/17 1708  Exercises  Exercises Elbow  Elbow Exercises  Elbow Flexion PROM;5 reps;AROM (12X)  Elbow Extension PROM;5 reps;AROM (12X)  Modalities  Modalities Moist Heat  Moist Heat Therapy  Number Minutes Moist Heat 10 Minutes  Moist Heat Location Elbow  Manual Therapy  Manual Therapy Myofascial release;Edema management;Muscle Energy Technique  Manual therapy comments Manual therapy completed prior to exercises  Edema Management Edema management techniques completed to LUE from wrist up to shoulder.  Myofascial Release Myofascial release and manual stretching completed to left elbow region to decrease fascial restrictions and increase joint mobility in a pain free zone.   Muscle Energy Technique Muscle energy technique completed to left elbow to relax tone and muscle spasm and improve range of motion.                             OT Short Term Goals - 03/04/17 1732      OT SHORT TERM GOAL #1   Title Patient will be educated and independent with HEP to faciliate progress in therapy and return to use LUE for daily tasks.    Time 6   Period Weeks   Status On-going     OT SHORT TERM GOAL #2   Title Patient will decrease edema in LUE by 1 cm or more as needed to increase functional mobility needed to complete light housekeeping tasks.  Time 6   Period Weeks   Status On-going     OT SHORT TERM GOAL #3   Title Patient will decrease fascial restrictions in LUE to mod amount to increase functional mobility needed to complete dressing activities.   Time 6   Period Weeks   Status On-going     OT SHORT TERM GOAL #4   Title Patient will increase LUE strength (shoulder and elbow) to 4-/5 to increase ability to complete household tasks below shoulder level.    Time 6   Period Weeks   Status On-going     OT SHORT TERM GOAL #5   Title Patient will report a pain score of 5/10 or less when using LUE for  daily tasks.    Time 6   Status On-going     OT SHORT TERM GOAL #6   Title Patient will increase P/ROM of LUE (shoulder and elbow) to [facility] to increase ability to complete dressing tasks.    Time 6   Period Weeks   Status On-going           OT Long Term Goals - 03/04/17 1732      OT LONG TERM GOAL #1   Title Patient will return to highest level of independence with all daily tasks while using LUE.    Time 12   Period Weeks   Status On-going     OT LONG TERM GOAL #2   Title Patient will decrease edema in LUE by 2-3 cm in order to increase functional mobility needed for overhead reaching tasks.    Time 12   Period Weeks   Status On-going     OT LONG TERM GOAL #3   Title Patient will decrease fascial restrictions in LUE to min amount or less to increase functional mobility needed to complete dressing tasks such as taking bra on and off.   Time 12   Period Weeks   Status On-going     OT LONG TERM GOAL #4   Title Patient will increase LUE strength (shoulder and elbow) to 4/5 to increase ability to return to normal household and daily lifting tasks.    Time 12   Period Weeks   Status On-going     OT LONG TERM GOAL #5   Title Patient will increase A/ROM of LUE (shoulder and elbow) to [facility] to increase ability to complete tasks overhead easier.    Time 12   Period Weeks   Status On-going     OT LONG TERM GOAL #6   Title Patient will report a pain level of 3/10 when completing daily tasks.    Time 12   Period Weeks   Status On-going         03/27/17 1711  OT Assessment and Plan  Clinical Impression Statement A: Continues to have great results with muscle energy technique and has now increased elbow extension to -6 degrees.   Pt will benefit from skilled therapeutic intervention in order to improve on the following deficits  Decreased scar mobility;Increased edema;Decreased range of motion;Increased fascial restricitons;Impaired UE functional use;Pain;Decreased  strength;Decreased knowledge of use of DME  Plan P: Continue with moist heat and complete mini reassessment for MD appointment on Wednesday.         Patient will benefit from skilled therapeutic intervention in order to improve the following deficits and impairments:  Decreased scar mobility, Increased edema, Decreased range of motion, Increased fascial restricitons, Impaired UE functional use, Pain, Decreased strength, Decreased knowledge of  use of DME  Visit Diagnosis: Stiffness of left elbow, not elsewhere classified  Stiffness of left shoulder, not elsewhere classified  Other symptoms and signs involving the musculoskeletal system  Pain in left elbow  Acute pain of left shoulder  Localized edema    Problem List Patient Active Problem List   Diagnosis Date Noted  . Proximal humerus fracture 02/16/2017  . Insomnia, controlled 01/06/2017  . MS (multiple sclerosis) ([place]) 12/09/2013   Ailene Ravel, OTR/L,CBIS  [phone]  03/29/2017, 5:19 PM  [place] [address] [place], [place], [address] Phone: [phone]   Fax:  [phone]  Name: [name] MRN: [id number] Date of Birth: 1956-03-03

## 2017-03-30 ENCOUNTER — Ambulatory Visit (HOSPITAL_COMMUNITY): Payer: Medicare Other

## 2017-03-30 ENCOUNTER — Encounter (HOSPITAL_COMMUNITY): Payer: Self-pay

## 2017-03-30 DIAGNOSIS — M25522 Pain in left elbow: Secondary | ICD-10-CM

## 2017-03-30 DIAGNOSIS — R600 Localized edema: Secondary | ICD-10-CM

## 2017-03-30 DIAGNOSIS — M25622 Stiffness of left elbow, not elsewhere classified: Secondary | ICD-10-CM | POA: Diagnosis not present

## 2017-03-30 DIAGNOSIS — M25612 Stiffness of left shoulder, not elsewhere classified: Secondary | ICD-10-CM

## 2017-03-30 DIAGNOSIS — M25512 Pain in left shoulder: Secondary | ICD-10-CM

## 2017-03-30 DIAGNOSIS — R29898 Other symptoms and signs involving the musculoskeletal system: Secondary | ICD-10-CM

## 2017-03-30 NOTE — Therapy (Signed)
[place], [place], [address] Phone: [phone]   Fax:  [phone]  Occupational Therapy Treatment  Patient Details  Name: [name] MRN: [id number] Date of Birth: August 01, 1955 Referring Provider: Dr. Marcene Duos  Encounter Date: 03/30/2017      OT End of Session - 03/30/17 1730    Visit Number 9   Number of Visits 36   Date for OT Re-Evaluation 05/01/17   Authorization Type [employer] medicare   Authorization Time Period before 10th visit   Authorization - Visit Number 9   Authorization - Number of Visits 10   OT Start Time 2992  mini reassess   OT Stop Time 1345   OT Time Calculation (min) 42 min   Activity Tolerance Patient tolerated treatment well   Behavior During Therapy [facility] for tasks assessed/performed      Past Medical History:  Diagnosis Date  . Arthritis    Osteoarthritis  . Colon polyps   . GERD (gastroesophageal reflux disease)   . Hiatal hernia   . Hypertension   . MS (multiple sclerosis) ([place]) 12/09/2013  . Multiple sclerosis ([place])   . Seizures ([place]) 01/04/2005   1 and Only    Past Surgical History:  Procedure Laterality Date  . COLONOSCOPY    . COLONOSCOPY N/A 09/16/2012   Procedure: COLONOSCOPY;  Surgeon: Rogene Houston, MD;  [facility];  Service: Endoscopy;  Laterality: N/A;  1030-rescheduled to 9:30 Ann notified pt  . DILATION AND CURETTAGE OF UTERUS    . ORIF HUMERUS FRACTURE Left 02/16/2017   Procedure: OPEN REDUCTION INTERNAL FIXATION (ORIF) LEFT PROXIMAL HUMERUS FRACTURE;  Surgeon: Meredith Pel, MD;  Location: [place];  Service: Orthopedics;  Laterality: Left;    There were no vitals filed for this visit.      Subjective Assessment - 03/30/17 1310    Subjective  S: It's sore but not as sore as it was the other day.   Currently in Pain? Yes   Pain Score 2    Pain Location Arm   Pain Orientation Left   Pain Descriptors / Indicators Sore   Pain Type Acute  pain   Pain Onset In the past 7 days            [facility] OT Assessment - 03/30/17 1332      Assessment   Diagnosis Left shoulder humerus fracture fixation     Precautions   Precautions Shoulder   Type of Shoulder Precautions 9/21: d/c sling, continue working on elbow ROM-especially extension. No strengthening shoulder muscles.      Edema   Edema wrist: 15.5 cm (previous: 16 cm), proximal to elbow: 22 cm (previous: 23.5 cm), elbow: 24.5 cm (previous: 28cm with elbow at 068 degrees elbow extension), upper arm: 29 cm (previous: 27 cm)     Palpation   Palpation comment Min fascial restrictions in left elbow region, upper arm, and trapezius region.     PROM   Left Elbow Flexion 136  previous: 136   Left Elbow Extension -6  previous: -18     Strength   Left Hand Grip (lbs) 41  previous: 30                  OT Treatments/Exercises (OP) - 03/30/17 1729      Exercises   Exercises Elbow     Elbow Exercises   Elbow Flexion PROM;5 reps;AROM  12X   Elbow Extension PROM;5 reps;AROM  12X     Modalities   Modalities Moist Heat     Moist Heat Therapy   Number Minutes Moist Heat 10 Minutes   Moist Heat Location Elbow     Manual Therapy   Manual Therapy Myofascial release;Edema management;Muscle Energy Technique   Manual therapy comments Manual therapy completed prior to exercises   Edema Management Edema management techniques completed to LUE from wrist up to shoulder.   Myofascial Release Myofascial release and manual stretching completed to left elbow region to decrease fascial restrictions and increase joint mobility in a pain free zone.    Muscle Energy Technique Muscle energy technique completed to left elbow to relax tone and muscle spasm and improve range of motion.                     OT Short Term Goals - 03/30/17 1731      OT SHORT TERM GOAL #1   Title Patient will be educated and independent with HEP to faciliate progress in therapy and  return to use LUE for daily tasks.    Time 6   Period Weeks   Status On-going     OT SHORT TERM GOAL #2   Title Patient will decrease edema in LUE by 1 cm or more as needed to increase functional mobility needed to complete light housekeeping tasks.    Time 6   Period Weeks   Status Achieved     OT SHORT TERM GOAL #3   Title Patient will decrease fascial restrictions in LUE to mod amount to increase functional mobility needed to complete dressing activities.   Time 6   Period Weeks   Status Achieved     OT SHORT TERM GOAL #4   Title Patient will increase LUE strength (shoulder and elbow) to 4-/5 to increase ability to complete household tasks below shoulder level.    Time 6   Period Weeks   Status On-going     OT SHORT TERM GOAL #5   Title Patient will report a pain score of 5/10 or less when using LUE for daily tasks.    Time 6   Status On-going     OT SHORT TERM GOAL #6   Title Patient will increase P/ROM of LUE (shoulder and elbow) to [facility] to increase ability to complete dressing tasks.    Time 6   Period Weeks   Status On-going           OT Long Term Goals - 03/04/17 1732      OT LONG TERM GOAL #1   Title Patient will return to highest level of independence with all daily tasks while using LUE.    Time 12   Period Weeks   Status On-going     OT LONG TERM GOAL #2   Title Patient will decrease edema in LUE by 2-3 cm in order to increase functional mobility needed for overhead reaching tasks.    Time 12   Period Weeks   Status On-going     OT LONG TERM GOAL #3   Title Patient will decrease fascial restrictions in LUE to min amount or less to increase functional mobility needed to complete dressing tasks such as taking bra on and off.   Time 12   Period Weeks   Status On-going     OT LONG TERM GOAL #4   Title Patient will increase LUE strength (shoulder and elbow) to 4/5 to increase ability to return to normal household  and daily lifting tasks.    Time 12    Period Weeks   Status On-going     OT LONG TERM GOAL #5   Title Patient will increase A/ROM of LUE (shoulder and elbow) to [facility] to increase ability to complete tasks overhead easier.    Time 12   Period Weeks   Status On-going     OT LONG TERM GOAL #6   Title Patient will report a pain level of 3/10 when completing daily tasks.    Time 12   Period Weeks   Status On-going               Plan - 03/30/17 1756    Clinical Impression Statement A: Mini reassessment completed this session for MD appointment. Pt has met 2/6 STGs at this point in therapy. Edema has gone down and patient has increased her passive elbow extension extensively. Fascial restrictions has also decreased.    Plan P: Follow up on MD appointment and start therapy on shoulder if MD ok's it. Measurements needed to start.       Patient will benefit from skilled therapeutic intervention in order to improve the following deficits and impairments:  Decreased scar mobility, Increased edema, Decreased range of motion, Increased fascial restricitons, Impaired UE functional use, Pain, Decreased strength, Decreased knowledge of use of DME  Visit Diagnosis: Stiffness of left elbow, not elsewhere classified  Stiffness of left shoulder, not elsewhere classified  Other symptoms and signs involving the musculoskeletal system  Pain in left elbow  Acute pain of left shoulder  Localized edema    Problem List Patient Active Problem List   Diagnosis Date Noted  . Proximal humerus fracture 02/16/2017  . Insomnia, controlled 01/06/2017  . MS (multiple sclerosis) ([place]) 12/09/2013   Ailene Ravel, OTR/L,CBIS  [phone]  03/30/2017, 8:18 PM  [facility] [address] [place], [place], [address] Phone: [phone]   Fax:  [phone]  Name: [name] MRN: [id number] Date of Birth: April 26, 1956

## 2017-04-01 ENCOUNTER — Encounter (INDEPENDENT_AMBULATORY_CARE_PROVIDER_SITE_OTHER): Payer: Self-pay | Admitting: Orthopedic Surgery

## 2017-04-01 ENCOUNTER — Encounter (HOSPITAL_COMMUNITY): Payer: Medicare Other

## 2017-04-01 ENCOUNTER — Ambulatory Visit (INDEPENDENT_AMBULATORY_CARE_PROVIDER_SITE_OTHER): Payer: Medicare Other | Admitting: Orthopedic Surgery

## 2017-04-01 ENCOUNTER — Ambulatory Visit (INDEPENDENT_AMBULATORY_CARE_PROVIDER_SITE_OTHER): Payer: Medicare Other

## 2017-04-01 DIAGNOSIS — S42202D Unspecified fracture of upper end of left humerus, subsequent encounter for fracture with routine healing: Secondary | ICD-10-CM

## 2017-04-03 ENCOUNTER — Encounter (HOSPITAL_COMMUNITY): Payer: Self-pay

## 2017-04-03 ENCOUNTER — Ambulatory Visit (HOSPITAL_COMMUNITY): Payer: Medicare Other

## 2017-04-03 DIAGNOSIS — M25522 Pain in left elbow: Secondary | ICD-10-CM

## 2017-04-03 DIAGNOSIS — M25622 Stiffness of left elbow, not elsewhere classified: Secondary | ICD-10-CM | POA: Diagnosis not present

## 2017-04-03 DIAGNOSIS — M25512 Pain in left shoulder: Secondary | ICD-10-CM

## 2017-04-03 DIAGNOSIS — M25612 Stiffness of left shoulder, not elsewhere classified: Secondary | ICD-10-CM

## 2017-04-03 DIAGNOSIS — R29898 Other symptoms and signs involving the musculoskeletal system: Secondary | ICD-10-CM

## 2017-04-03 NOTE — Patient Instructions (Signed)

## 2017-04-03 NOTE — Therapy (Signed)
[place], [place], [address] Phone: [phone]   Fax:  [phone]  Occupational Therapy Treatment  Patient Details  Name: [name] MRN: [id number] Date of Birth: 1955/08/11 Referring Provider: Dr. Marcene Duos  Encounter Date: 04/03/2017      OT End of Session - 04/03/17 1138    Visit Number 10   Number of Visits 80   Date for OT Re-Evaluation 05/01/17   Authorization Type [employer] medicare   Authorization Time Period before 20th visit   Authorization - Visit Number 10   Authorization - Number of Visits 20   OT Start Time 1110   OT Stop Time 1200   OT Time Calculation (min) 50 min   Activity Tolerance Patient tolerated treatment well   Behavior During Therapy [facility] for tasks assessed/performed      Past Medical History:  Diagnosis Date  . Arthritis    Osteoarthritis  . Colon polyps   . GERD (gastroesophageal reflux disease)   . Hiatal hernia   . Hypertension   . MS (multiple sclerosis) ([place]) 12/09/2013  . Multiple sclerosis ([place])   . Seizures ([place]) 01/04/2005   1 and Only    Past Surgical History:  Procedure Laterality Date  . COLONOSCOPY    . COLONOSCOPY N/A 09/16/2012   Procedure: COLONOSCOPY;  Surgeon: Rogene Houston, MD;  [facility];  Service: Endoscopy;  Laterality: N/A;  1030-rescheduled to 9:30 Ann notified pt  . DILATION AND CURETTAGE OF UTERUS    . ORIF HUMERUS FRACTURE Left 02/16/2017   Procedure: OPEN REDUCTION INTERNAL FIXATION (ORIF) LEFT PROXIMAL HUMERUS FRACTURE;  Surgeon: Meredith Pel, MD;  Location: [place];  Service: Orthopedics;  Laterality: Left;    There were no vitals filed for this visit.      Subjective Assessment - 04/03/17 1131    Subjective  S: He said i can start driving now if I feel like it.    Currently in Pain? No/denies            [place] OT Assessment - 04/03/17 1111      Assessment   Diagnosis Left shoulder humerus fracture fixation      Precautions   Precautions Shoulder   Type of Shoulder Precautions 10/12: Ok to begin strengthening of LUE. Fracture has healed. Continue therapy 1-2x a week for 6 weeks.      ROM / Strength   AROM / PROM / Strength AROM;PROM;Strength     AROM   Overall AROM Comments Assessed seated. IR/er adducted   AROM Assessment Site Shoulder   Right/Left Shoulder Left   Left Shoulder Flexion 85 Degrees   Left Shoulder ABduction 85 Degrees   Left Shoulder Internal Rotation 90 Degrees   Left Shoulder External Rotation 60 Degrees     PROM   Overall PROM Comments Assessed supine. IR/er adducted,   PROM Assessment Site Shoulder   Right/Left Shoulder Left   Left Shoulder Flexion 110 Degrees   Left Shoulder ABduction 85 Degrees   Left Shoulder Internal Rotation 90 Degrees   Left Shoulder External Rotation 55 Degrees                  OT Treatments/Exercises (OP) - 04/03/17 1127      Exercises   Exercises Elbow;Shoulder     Shoulder Exercises: Supine   Protraction PROM;AROM;10 reps   Horizontal ABduction PROM;AROM;10 reps   External Rotation PROM;AROM;10 reps   Internal Rotation PROM;AROM;10 reps  Flexion PROM;AROM;10 reps   ABduction PROM;AROM;10 reps     Shoulder Exercises: Seated   Protraction AAROM;10 reps   Horizontal ABduction AAROM;10 reps   External Rotation AAROM;10 reps   Internal Rotation AAROM;10 reps   Flexion AAROM;10 reps   Abduction AAROM;10 reps     Manual Therapy   Manual Therapy Myofascial release   Manual therapy comments Manual therapy completed prior to exercises   Myofascial Release Myofascial release and manual stretching completed to left upper ar,. trapezius, and scapularis region to decrease fascial restrictions and increase joint mobility in a pain free zone.                 OT Education - 04/03/17 1202    Education provided Yes   Education Details AA/ROM shoulder exercises. Patient was told to continue with elbow  extension/flexion A/ROM. No need to continue with supination/pronation and wrist flexion extension. patient may continue with putty for grip if she feels necessary.   Person(s) Educated Patient   Methods Explanation;Demonstration;Handout;Verbal cues;Tactile cues   Comprehension Returned demonstration;Verbalized understanding          OT Short Term Goals - 04/03/17 1208      OT SHORT TERM GOAL #1   Title Patient will be educated and independent with HEP to faciliate progress in therapy and return to use LUE for daily tasks.    Time 6   Period Weeks   Status On-going     OT SHORT TERM GOAL #2   Title Patient will decrease edema in LUE by 1 cm or more as needed to increase functional mobility needed to complete light housekeeping tasks.    Time 6   Period Weeks     OT SHORT TERM GOAL #3   Title Patient will decrease fascial restrictions in LUE to mod amount to increase functional mobility needed to complete dressing activities.   Time 6   Period Weeks     OT SHORT TERM GOAL #4   Title Patient will increase LUE strength (shoulder and elbow) to 4-/5 to increase ability to complete household tasks below shoulder level.    Time 6   Period Weeks   Status On-going     OT SHORT TERM GOAL #5   Title Patient will report a pain score of 5/10 or less when using LUE for daily tasks.    Time 6   Status On-going     OT SHORT TERM GOAL #6   Title Patient will increase P/ROM of LUE (shoulder and elbow) to [facility] to increase ability to complete dressing tasks.    Time 6   Period Weeks   Status On-going           OT Long Term Goals - 03/04/17 1732      OT LONG TERM GOAL #1   Title Patient will return to highest level of independence with all daily tasks while using LUE.    Time 12   Period Weeks   Status On-going     OT LONG TERM GOAL #2   Title Patient will decrease edema in LUE by 2-3 cm in order to increase functional mobility needed for overhead reaching tasks.    Time 12    Period Weeks   Status On-going     OT LONG TERM GOAL #3   Title Patient will decrease fascial restrictions in LUE to min amount or less to increase functional mobility needed to complete dressing tasks such as taking bra on and off.  Time 12   Period Weeks   Status On-going     OT LONG TERM GOAL #4   Title Patient will increase LUE strength (shoulder and elbow) to 4/5 to increase ability to return to normal household and daily lifting tasks.    Time 12   Period Weeks   Status On-going     OT LONG TERM GOAL #5   Title Patient will increase A/ROM of LUE (shoulder and elbow) to [facility] to increase ability to complete tasks overhead easier.    Time 12   Period Weeks   Status On-going     OT LONG TERM GOAL #6   Title Patient will report a pain level of 3/10 when completing daily tasks.    Time 12   Period Weeks   Status On-going               Plan - 04/23/2017 10-08-1204    Clinical Impression Statement A: G code updated this date. patient just received the ok to begin using her Left arm for all activities as she is able to tolerate. She drove herself to therapy this AM although she states it wasn't that far to assess how it went. She was tense during shoulder stretches and was very protective of movement. She has pain near her fracture site during shoulder P/ROM. Measurements were taken of the shoulder.    Plan P: Add wall wash and PVC pipe slide.       Patient will benefit from skilled therapeutic intervention in order to improve the following deficits and impairments:     Visit Diagnosis: Stiffness of left elbow, not elsewhere classified  Stiffness of left shoulder, not elsewhere classified  Other symptoms and signs involving the musculoskeletal system  Pain in left elbow  Acute pain of left shoulder      G-Codes - 23-Apr-2017 1206/10/09    Functional Assessment Tool Used (Outpatient only) FOTO score: 36/100 (64% impaired)   Functional Limitation Self care   Self Care Current  Status (Z6629) At least 60 percent but less than 80 percent impaired, limited or restricted   Self Care Goal Status (U7654) At least 20 percent but less than 40 percent impaired, limited or restricted      Problem List Patient Active Problem List   Diagnosis Date Noted  . Proximal humerus fracture 02/16/2017  . Insomnia, controlled 01/06/2017  . MS (multiple sclerosis) ([place]) 12/09/2013   Ailene Ravel, OTR/L,CBIS  [phone]  04-23-2017, 12:09 PM  [facility] [address] [place], [place], [address] Phone: [phone]   Fax:  [phone]  Name: [name] MRN: [id number] Date of Birth: 1955-09-25

## 2017-04-05 NOTE — Progress Notes (Signed)
   Post-Op Visit Note   Patient: [name]           Date of Birth: 10/28/1955           MRN: [id number] Visit Date: 04/01/2017 PCP: Sharilyn Sites, MD   Assessment & Plan:  Chief Complaint:  Chief Complaint  Patient presents with  . Left Shoulder - Routine Post Op   Visit Diagnoses:  1. Closed fracture of proximal end of left humerus with routine healing, unspecified fracture morphology, subsequent encounter     Plan: Keerthi is a patient who underwent open reduction internal fixation of leftproximal humerus fracture.  She's been doing well.  She's been to physical therapy 9 times.  Deltoid is functioning better.  Has about a 15 flexion contracture at the elbow.  All in all patient is doing well.  I will see her back in 6 weeks with repeat radiographs and likely release at that time.  Follow-Up Instructions: No Follow-up on file.   Orders:  Orders Placed This Encounter  Procedures  . XR Humerus Left   No orders of the defined types were placed in this encounter.   Imaging: No results found.  PMFS History: Patient Active Problem List   Diagnosis Date Noted  . Proximal humerus fracture 02/16/2017  . Insomnia, controlled 01/06/2017  . MS (multiple sclerosis) ([place]) 12/09/2013   Past Medical History:  Diagnosis Date  . Arthritis    Osteoarthritis  . Colon polyps   . GERD (gastroesophageal reflux disease)   . Hiatal hernia   . Hypertension   . MS (multiple sclerosis) ([place]) 12/09/2013  . Multiple sclerosis ([place])   . Seizures ([place]) 01/04/2005   1 and Only    Family History  Problem Relation Age of Onset  . Stomach cancer Father   . Sick sinus syndrome Father   . Colon cancer Neg Hx     Past Surgical History:  Procedure Laterality Date  . COLONOSCOPY    . COLONOSCOPY N/A 09/16/2012   Procedure: COLONOSCOPY;  Surgeon: Rogene Houston, MD;  [facility];  Service: Endoscopy;  Laterality: N/A;  1030-rescheduled to 9:30 Ann notified pt  . DILATION AND  CURETTAGE OF UTERUS    . ORIF HUMERUS FRACTURE Left 02/16/2017   Procedure: OPEN REDUCTION INTERNAL FIXATION (ORIF) LEFT PROXIMAL HUMERUS FRACTURE;  Surgeon: Meredith Pel, MD;  Location: [place];  Service: Orthopedics;  Laterality: Left;   Social History   Occupational History  .  Disabled   Social History Main Topics  . Smoking status: Current Every Day Smoker    Packs/day: 0.75    Years: 30.00    Types: Cigarettes  . Smokeless tobacco: Never Used  . Alcohol use 4.8 oz/week    8 Cans of beer per week  . Drug use: No  . Sexual activity: Not on file

## 2017-04-06 ENCOUNTER — Encounter (HOSPITAL_COMMUNITY): Payer: Medicare Other

## 2017-04-08 ENCOUNTER — Encounter (HOSPITAL_COMMUNITY): Payer: Self-pay

## 2017-04-08 ENCOUNTER — Ambulatory Visit (HOSPITAL_COMMUNITY): Payer: Medicare Other

## 2017-04-08 DIAGNOSIS — R29898 Other symptoms and signs involving the musculoskeletal system: Secondary | ICD-10-CM

## 2017-04-08 DIAGNOSIS — M25512 Pain in left shoulder: Secondary | ICD-10-CM

## 2017-04-08 DIAGNOSIS — M25622 Stiffness of left elbow, not elsewhere classified: Secondary | ICD-10-CM | POA: Diagnosis not present

## 2017-04-08 DIAGNOSIS — R600 Localized edema: Secondary | ICD-10-CM

## 2017-04-08 DIAGNOSIS — M25522 Pain in left elbow: Secondary | ICD-10-CM

## 2017-04-08 DIAGNOSIS — M25612 Stiffness of left shoulder, not elsewhere classified: Secondary | ICD-10-CM

## 2017-04-08 NOTE — Therapy (Signed)
[place], [place], [address] Phone: [phone]   Fax:  [phone]  Occupational Therapy Treatment  Patient Details  Name: [name] MRN: [id number] Date of Birth: September 15, 1955 Referring Provider: Dr. Marcene Duos  Encounter Date: 04/08/2017      OT End of Session - 04/08/17 1342    Visit Number 11   Number of Visits 51   Date for OT Re-Evaluation 05/01/17   Authorization Type [employer] medicare   Authorization Time Period before 20th visit   Authorization - Visit Number 11   Authorization - Number of Visits 20   OT Start Time 1300   OT Stop Time 1345   OT Time Calculation (min) 45 min   Activity Tolerance Patient tolerated treatment well   Behavior During Therapy [facility] for tasks assessed/performed      Past Medical History:  Diagnosis Date  . Arthritis    Osteoarthritis  . Colon polyps   . GERD (gastroesophageal reflux disease)   . Hiatal hernia   . Hypertension   . MS (multiple sclerosis) ([place]) 12/09/2013  . Multiple sclerosis ([place])   . Seizures ([place]) 01/04/2005   1 and Only    Past Surgical History:  Procedure Laterality Date  . COLONOSCOPY    . COLONOSCOPY N/A 09/16/2012   Procedure: COLONOSCOPY;  Surgeon: Rogene Houston, MD;  [facility];  Service: Endoscopy;  Laterality: N/A;  1030-rescheduled to 9:30 Ann notified pt  . DILATION AND CURETTAGE OF UTERUS    . ORIF HUMERUS FRACTURE Left 02/16/2017   Procedure: OPEN REDUCTION INTERNAL FIXATION (ORIF) LEFT PROXIMAL HUMERUS FRACTURE;  Surgeon: Meredith Pel, MD;  Location: [place];  Service: Orthopedics;  Laterality: Left;    There were no vitals filed for this visit.      Subjective Assessment - 04/08/17 1331    Currently in Pain? Yes   Pain Score 3    Pain Location Arm   Pain Orientation Left   Pain Descriptors / Indicators Sore   Pain Type Acute pain   Pain Radiating Towards N/A   Pain Onset In the past 7 days   Pain  Frequency Constant   Aggravating Factors  Movement and use   Pain Relieving Factors rest   Effect of Pain on Daily Activities Severe   Multiple Pain Sites No            [facility] OT Assessment - 04/08/17 1332      Assessment   Diagnosis Left shoulder humerus fracture fixation     Precautions   Precautions Shoulder   Type of Shoulder Precautions 10/12: Ok to begin strengthening of LUE. Fracture has healed. Continue therapy 1-2x a week for 6 weeks.                   OT Treatments/Exercises (OP) - 04/08/17 1332      Exercises   Exercises Elbow;Shoulder     Shoulder Exercises: Supine   Protraction PROM;AROM;10 reps   Horizontal ABduction PROM;AROM;10 reps   External Rotation PROM;AROM;10 reps   Internal Rotation PROM;AROM;10 reps   Flexion PROM;AROM;10 reps   ABduction PROM;AROM;10 reps     Shoulder Exercises: Seated   Protraction AAROM;10 reps   Horizontal ABduction AAROM;10 reps   External Rotation AAROM;10 reps   Internal Rotation AAROM;10 reps   Flexion AAROM;10 reps   Abduction AAROM;10 reps     Manual Therapy   Manual Therapy Myofascial release   Manual therapy  comments Manual therapy completed prior to exercises   Myofascial Release Myofascial release and manual stretching completed to left upper ar,. trapezius, and scapularis region to decrease fascial restrictions and increase joint mobility in a pain free zone.    Muscle Energy Technique Muscle energy technique completed to left shoulder to relax tone and muscle spasm and improve range of motion.                     OT Short Term Goals - 04/03/17 1208      OT SHORT TERM GOAL #1   Title Patient will be educated and independent with HEP to faciliate progress in therapy and return to use LUE for daily tasks.    Time 6   Period Weeks   Status On-going     OT SHORT TERM GOAL #2   Title Patient will decrease edema in LUE by 1 cm or more as needed to increase functional mobility needed to  complete light housekeeping tasks.    Time 6   Period Weeks     OT SHORT TERM GOAL #3   Title Patient will decrease fascial restrictions in LUE to mod amount to increase functional mobility needed to complete dressing activities.   Time 6   Period Weeks     OT SHORT TERM GOAL #4   Title Patient will increase LUE strength (shoulder and elbow) to 4-/5 to increase ability to complete household tasks below shoulder level.    Time 6   Period Weeks   Status On-going     OT SHORT TERM GOAL #5   Title Patient will report a pain score of 5/10 or less when using LUE for daily tasks.    Time 6   Status On-going     OT SHORT TERM GOAL #6   Title Patient will increase P/ROM of LUE (shoulder and elbow) to [facility] to increase ability to complete dressing tasks.    Time 6   Period Weeks   Status On-going           OT Long Term Goals - 03/04/17 1732      OT LONG TERM GOAL #1   Title Patient will return to highest level of independence with all daily tasks while using LUE.    Time 12   Period Weeks   Status On-going     OT LONG TERM GOAL #2   Title Patient will decrease edema in LUE by 2-3 cm in order to increase functional mobility needed for overhead reaching tasks.    Time 12   Period Weeks   Status On-going     OT LONG TERM GOAL #3   Title Patient will decrease fascial restrictions in LUE to min amount or less to increase functional mobility needed to complete dressing tasks such as taking bra on and off.   Time 12   Period Weeks   Status On-going     OT LONG TERM GOAL #4   Title Patient will increase LUE strength (shoulder and elbow) to 4/5 to increase ability to return to normal household and daily lifting tasks.    Time 12   Period Weeks   Status On-going     OT LONG TERM GOAL #5   Title Patient will increase A/ROM of LUE (shoulder and elbow) to [facility] to increase ability to complete tasks overhead easier.    Time 12   Period Weeks   Status On-going     OT LONG TERM GOAL  #  6   Title Patient will report a pain level of 3/10 when completing daily tasks.    Time 12   Period Weeks   Status On-going               Plan - 04/08/17 1343    Clinical Impression Statement A: Pt continues to have joint limitation in LUE. Pt has increased difficulty with relaxing and no pushing against therapist during passive stretching. Muscle energy used this date with patient able to achieve slightly more shoulder flexion. VC for form and technique needed. Unable to complete pvc pipe slide due to time constraint.    Plan P: Add pvc pipe slide.      Patient will benefit from skilled therapeutic intervention in order to improve the following deficits and impairments:  Decreased scar mobility, Increased edema, Decreased range of motion, Increased fascial restricitons, Impaired UE functional use, Pain, Decreased strength, Decreased knowledge of use of DME  Visit Diagnosis: Stiffness of left elbow, not elsewhere classified  Stiffness of left shoulder, not elsewhere classified  Other symptoms and signs involving the musculoskeletal system  Pain in left elbow  Acute pain of left shoulder  Localized edema    Problem List Patient Active Problem List   Diagnosis Date Noted  . Proximal humerus fracture 02/16/2017  . Insomnia, controlled 01/06/2017  . MS (multiple sclerosis) ([place]) 12/09/2013   Ailene Ravel, OTR/L,CBIS  [phone]  04/08/2017, 4:09 PM  [place] [address] [place], [place], [address] Phone: [phone]   Fax:  [phone]  Name: [name] MRN: [id number] Date of Birth: 03-Mar-1956

## 2017-04-09 ENCOUNTER — Telehealth (HOSPITAL_COMMUNITY): Payer: Self-pay

## 2017-04-09 NOTE — Telephone Encounter (Signed)
Her arm is sore and she will take a day off from OT

## 2017-04-10 ENCOUNTER — Ambulatory Visit (HOSPITAL_COMMUNITY): Payer: Medicare Other

## 2017-04-13 ENCOUNTER — Encounter (HOSPITAL_COMMUNITY): Payer: Medicare Other

## 2017-04-14 ENCOUNTER — Encounter (HOSPITAL_COMMUNITY): Payer: Self-pay

## 2017-04-14 ENCOUNTER — Ambulatory Visit (HOSPITAL_COMMUNITY): Payer: Medicare Other

## 2017-04-14 DIAGNOSIS — R600 Localized edema: Secondary | ICD-10-CM

## 2017-04-14 DIAGNOSIS — M25612 Stiffness of left shoulder, not elsewhere classified: Secondary | ICD-10-CM

## 2017-04-14 DIAGNOSIS — R29898 Other symptoms and signs involving the musculoskeletal system: Secondary | ICD-10-CM

## 2017-04-14 DIAGNOSIS — M25622 Stiffness of left elbow, not elsewhere classified: Secondary | ICD-10-CM | POA: Diagnosis not present

## 2017-04-14 DIAGNOSIS — M25522 Pain in left elbow: Secondary | ICD-10-CM

## 2017-04-14 DIAGNOSIS — M25512 Pain in left shoulder: Secondary | ICD-10-CM

## 2017-04-14 NOTE — Patient Instructions (Signed)

## 2017-04-14 NOTE — Therapy (Signed)
[place], [place], [address] Phone: [phone]   Fax:  [phone]  Occupational Therapy Treatment  Patient Details  Name: [name] MRN: [id number] Date of Birth: 09-May-1956 Referring Provider: Dr. Marcene Duos  Encounter Date: 04/14/2017      OT End of Session - 04/14/17 1704    Visit Number 12   Number of Visits 84   Date for OT Re-Evaluation 05/01/17   Authorization Type [employer] medicare   Authorization Time Period before 20th visit   Authorization - Visit Number 12   Authorization - Number of Visits 20   OT Start Time 6160   OT Stop Time 1600   OT Time Calculation (min) 45 min   Activity Tolerance Patient tolerated treatment well   Behavior During Therapy [facility] for tasks assessed/performed      Past Medical History:  Diagnosis Date  . Arthritis    Osteoarthritis  . Colon polyps   . GERD (gastroesophageal reflux disease)   . Hiatal hernia   . Hypertension   . MS (multiple sclerosis) ([place]) 12/09/2013  . Multiple sclerosis ([place])   . Seizures ([place]) 01/04/2005   1 and Only    Past Surgical History:  Procedure Laterality Date  . COLONOSCOPY    . COLONOSCOPY N/A 09/16/2012   Procedure: COLONOSCOPY;  Surgeon: Rogene Houston, MD;  [facility];  Service: Endoscopy;  Laterality: N/A;  1030-rescheduled to 9:30 Ann notified pt  . DILATION AND CURETTAGE OF UTERUS    . ORIF HUMERUS FRACTURE Left 02/16/2017   Procedure: OPEN REDUCTION INTERNAL FIXATION (ORIF) LEFT PROXIMAL HUMERUS FRACTURE;  Surgeon: Meredith Pel, MD;  Location: [place];  Service: Orthopedics;  Laterality: Left;    There were no vitals filed for this visit.          [facility] OT Assessment - 04/14/17 1533      Assessment   Diagnosis Left shoulder humerus fracture fixation     Precautions   Precautions Shoulder   Type of Shoulder Precautions 10/12: Ok to begin strengthening of LUE. Fracture has healed. Continue therapy  1-2x a week for 6 weeks.                   OT Treatments/Exercises (OP) - 04/14/17 1533      Exercises   Exercises Elbow;Shoulder     Shoulder Exercises: Supine   Protraction PROM;10 reps;AROM;12 reps   Horizontal ABduction PROM;10 reps;AROM;12 reps   External Rotation PROM;10 reps;AROM;12 reps   Internal Rotation PROM;10 reps;AROM;12 reps   Flexion PROM;10 reps;AROM;12 reps   ABduction PROM;10 reps;AROM;12 reps     Shoulder Exercises: Seated   Protraction AROM;15 reps   Horizontal ABduction AROM;15 reps   External Rotation AROM;15 reps   Internal Rotation AROM;15 reps   Flexion AROM;15 reps   Abduction AROM;15 reps     Shoulder Exercises: ROM/Strengthening   Proximal Shoulder Strengthening, Seated 10X no rest breaks                OT Education - 04/14/17 1707    Education provided Yes   Education Details A/ROM shoulder exercises   Person(s) Educated Patient   Methods Explanation;Demonstration;Verbal cues;Handout;Tactile cues   Comprehension Returned demonstration;Verbalized understanding          OT Short Term Goals - 04/03/17 1208      OT SHORT TERM GOAL #1   Title Patient will be educated and independent with HEP to faciliate progress  in therapy and return to use LUE for daily tasks.    Time 6   Period Weeks   Status On-going     OT SHORT TERM GOAL #2   Title Patient will decrease edema in LUE by 1 cm or more as needed to increase functional mobility needed to complete light housekeeping tasks.    Time 6   Period Weeks     OT SHORT TERM GOAL #3   Title Patient will decrease fascial restrictions in LUE to mod amount to increase functional mobility needed to complete dressing activities.   Time 6   Period Weeks     OT SHORT TERM GOAL #4   Title Patient will increase LUE strength (shoulder and elbow) to 4-/5 to increase ability to complete household tasks below shoulder level.    Time 6   Period Weeks   Status On-going     OT SHORT  TERM GOAL #5   Title Patient will report a pain score of 5/10 or less when using LUE for daily tasks.    Time 6   Status On-going     OT SHORT TERM GOAL #6   Title Patient will increase P/ROM of LUE (shoulder and elbow) to [facility] to increase ability to complete dressing tasks.    Time 6   Period Weeks   Status On-going           OT Long Term Goals - 03/04/17 1732      OT LONG TERM GOAL #1   Title Patient will return to highest level of independence with all daily tasks while using LUE.    Time 12   Period Weeks   Status On-going     OT LONG TERM GOAL #2   Title Patient will decrease edema in LUE by 2-3 cm in order to increase functional mobility needed for overhead reaching tasks.    Time 12   Period Weeks   Status On-going     OT LONG TERM GOAL #3   Title Patient will decrease fascial restrictions in LUE to min amount or less to increase functional mobility needed to complete dressing tasks such as taking bra on and off.   Time 12   Period Weeks   Status On-going     OT LONG TERM GOAL #4   Title Patient will increase LUE strength (shoulder and elbow) to 4/5 to increase ability to return to normal household and daily lifting tasks.    Time 12   Period Weeks   Status On-going     OT LONG TERM GOAL #5   Title Patient will increase A/ROM of LUE (shoulder and elbow) to [facility] to increase ability to complete tasks overhead easier.    Time 12   Period Weeks   Status On-going     OT LONG TERM GOAL #6   Title Patient will report a pain level of 3/10 when completing daily tasks.    Time 12   Period Weeks   Status On-going               Plan - 04/14/17 1705    Clinical Impression Statement A: Pt arrived early and requested to have moist heat placed on her shoulder and neck prior to therapy session. Patient was able to increase repetitions for A/ROM supine and was able to complete A/ROM seated as well. Pt was given A/ROM for HEP. VC for form and technique as needed.    Plan P: Add pvc pipe slide. Seated  proximal shoulder strengthening and wall wash.      Patient will benefit from skilled therapeutic intervention in order to improve the following deficits and impairments:     Visit Diagnosis: Stiffness of left elbow, not elsewhere classified  Stiffness of left shoulder, not elsewhere classified  Other symptoms and signs involving the musculoskeletal system  Pain in left elbow  Acute pain of left shoulder  Localized edema    Problem List Patient Active Problem List   Diagnosis Date Noted  . Proximal humerus fracture 02/16/2017  . Insomnia, controlled 01/06/2017  . MS (multiple sclerosis) ([place]) 12/09/2013   Ailene Ravel, OTR/L,CBIS  [phone]  04/14/2017, 5:07 PM  [facility] [address] [place], [place], [address] Phone: [phone]   Fax:  [phone]  Name: [name] MRN: [id number] Date of Birth: 10-20-1955

## 2017-04-15 ENCOUNTER — Encounter (HOSPITAL_COMMUNITY): Payer: Medicare Other

## 2017-04-17 ENCOUNTER — Encounter (HOSPITAL_COMMUNITY): Payer: Self-pay | Admitting: Occupational Therapy

## 2017-04-17 ENCOUNTER — Ambulatory Visit (HOSPITAL_COMMUNITY): Payer: Medicare Other | Admitting: Occupational Therapy

## 2017-04-17 DIAGNOSIS — M25612 Stiffness of left shoulder, not elsewhere classified: Secondary | ICD-10-CM

## 2017-04-17 DIAGNOSIS — R29898 Other symptoms and signs involving the musculoskeletal system: Secondary | ICD-10-CM

## 2017-04-17 DIAGNOSIS — M25622 Stiffness of left elbow, not elsewhere classified: Secondary | ICD-10-CM

## 2017-04-17 DIAGNOSIS — M25512 Pain in left shoulder: Secondary | ICD-10-CM

## 2017-04-17 DIAGNOSIS — M25522 Pain in left elbow: Secondary | ICD-10-CM

## 2017-04-17 NOTE — Therapy (Signed)
[place], [place], [address] Phone: [phone]   Fax:  [phone]  Occupational Therapy Treatment  Patient Details  Name: [name] MRN: [id number] Date of Birth: 03/07/1956 Referring Provider: Dr. Marcene Duos  Encounter Date: 04/17/2017      OT End of Session - 04/17/17 1341    Visit Number 13   Number of Visits 29   Date for OT Re-Evaluation 05/01/17   Authorization Type [employer] medicare   Authorization Time Period before 20th visit   Authorization - Visit Number 13   Authorization - Number of Visits 20   OT Start Time 1300   OT Stop Time 1341   OT Time Calculation (min) 41 min   Activity Tolerance Patient tolerated treatment well   Behavior During Therapy [facility] for tasks assessed/performed      Past Medical History:  Diagnosis Date  . Arthritis    Osteoarthritis  . Colon polyps   . GERD (gastroesophageal reflux disease)   . Hiatal hernia   . Hypertension   . MS (multiple sclerosis) ([place]) 12/09/2013  . Multiple sclerosis ([place])   . Seizures ([place]) 01/04/2005   1 and Only    Past Surgical History:  Procedure Laterality Date  . COLONOSCOPY    . COLONOSCOPY N/A 09/16/2012   Procedure: COLONOSCOPY;  Surgeon: Rogene Houston, MD;  [facility];  Service: Endoscopy;  Laterality: N/A;  1030-rescheduled to 9:30 Ann notified pt  . DILATION AND CURETTAGE OF UTERUS    . ORIF HUMERUS FRACTURE Left 02/16/2017   Procedure: OPEN REDUCTION INTERNAL FIXATION (ORIF) LEFT PROXIMAL HUMERUS FRACTURE;  Surgeon: Meredith Pel, MD;  Location: [place];  Service: Orthopedics;  Laterality: Left;    There were no vitals filed for this visit.      Subjective Assessment - 04/17/17 1300    Subjective  S: It was easier to get my coat off today.    Currently in Pain? No/denies            [facility] OT Assessment - 04/17/17 1259      Assessment   Diagnosis Left shoulder humerus fracture fixation      Precautions   Precautions Shoulder   Type of Shoulder Precautions 10/12: Ok to begin strengthening of LUE. Fracture has healed. Continue therapy 1-2x a week for 6 weeks.                   OT Treatments/Exercises (OP) - 04/17/17 1302      Exercises   Exercises Elbow;Shoulder     Shoulder Exercises: Supine   Protraction PROM;10 reps;AROM;15 reps   Horizontal ABduction PROM;10 reps;AROM;15 reps   External Rotation PROM;10 reps;AROM;15 reps   Internal Rotation PROM;10 reps;AROM;15 reps   Flexion PROM;10 reps;AROM;15 reps   ABduction PROM;10 reps;AROM;12 reps     Shoulder Exercises: Seated   Protraction AROM;15 reps   Horizontal ABduction AROM;15 reps   External Rotation AROM;15 reps   Internal Rotation AROM;15 reps   Flexion AROM;15 reps   Abduction AROM;15 reps     Shoulder Exercises: Standing   Other Standing Exercises PVC Pipe slide, 10X, A/ROM     Shoulder Exercises: ROM/Strengthening   Wall Wash 1'   Proximal Shoulder Strengthening, Supine 10X each no rest breaks   Proximal Shoulder Strengthening, Seated 10X no rest breaks     Manual Therapy   Manual Therapy Myofascial release   Manual therapy comments Manual therapy completed prior to exercises  Myofascial Release Myofascial release and manual stretching completed to left upper ar,. trapezius, and scapularis region to decrease fascial restrictions and increase joint mobility in a pain free zone.    Muscle Energy Technique Muscle energy technique completed to left shoulder to relax tone and muscle spasm and improve range of motion.                     OT Short Term Goals - 04/03/17 1208      OT SHORT TERM GOAL #1   Title Patient will be educated and independent with HEP to faciliate progress in therapy and return to use LUE for daily tasks.    Time 6   Period Weeks   Status On-going     OT SHORT TERM GOAL #2   Title Patient will decrease edema in LUE by 1 cm or more as needed to increase  functional mobility needed to complete light housekeeping tasks.    Time 6   Period Weeks     OT SHORT TERM GOAL #3   Title Patient will decrease fascial restrictions in LUE to mod amount to increase functional mobility needed to complete dressing activities.   Time 6   Period Weeks     OT SHORT TERM GOAL #4   Title Patient will increase LUE strength (shoulder and elbow) to 4-/5 to increase ability to complete household tasks below shoulder level.    Time 6   Period Weeks   Status On-going     OT SHORT TERM GOAL #5   Title Patient will report a pain score of 5/10 or less when using LUE for daily tasks.    Time 6   Status On-going     OT SHORT TERM GOAL #6   Title Patient will increase P/ROM of LUE (shoulder and elbow) to [facility] to increase ability to complete dressing tasks.    Time 6   Period Weeks   Status On-going           OT Long Term Goals - 03/04/17 1732      OT LONG TERM GOAL #1   Title Patient will return to highest level of independence with all daily tasks while using LUE.    Time 12   Period Weeks   Status On-going     OT LONG TERM GOAL #2   Title Patient will decrease edema in LUE by 2-3 cm in order to increase functional mobility needed for overhead reaching tasks.    Time 12   Period Weeks   Status On-going     OT LONG TERM GOAL #3   Title Patient will decrease fascial restrictions in LUE to min amount or less to increase functional mobility needed to complete dressing tasks such as taking bra on and off.   Time 12   Period Weeks   Status On-going     OT LONG TERM GOAL #4   Title Patient will increase LUE strength (shoulder and elbow) to 4/5 to increase ability to return to normal household and daily lifting tasks.    Time 12   Period Weeks   Status On-going     OT LONG TERM GOAL #5   Title Patient will increase A/ROM of LUE (shoulder and elbow) to [facility] to increase ability to complete tasks overhead easier.    Time 12   Period Weeks   Status  On-going     OT LONG TERM GOAL #6   Title Patient will report a pain  level of 3/10 when completing daily tasks.    Time 12   Period Weeks   Status On-going               Plan - 04/17/17 1322    Clinical Impression Statement A: Pt reporting she was able to reach further yesterday when doing some things at home and is trying to only take pain medication if in severe pain. Continued with A/ROM this session increasing repetitions to 15, added pvc pipe slide, wall wash, and proximal shoulder strengthening in sitting. Verbal cuing for form and technique, occasional rest breaks.    Plan P: Attempt x to v arms if pt able complete with good form. Continued with PVC pipe slide   OT Home Exercise Plan 9/10: elbow exercises, yellow theraputty, edema management   Consulted and Agree with Plan of Care Patient      Patient will benefit from skilled therapeutic intervention in order to improve the following deficits and impairments:  Decreased scar mobility, Increased edema, Decreased range of motion, Increased fascial restricitons, Impaired UE functional use, Pain, Decreased strength, Decreased knowledge of use of DME  Visit Diagnosis: Stiffness of left elbow, not elsewhere classified  Stiffness of left shoulder, not elsewhere classified  Other symptoms and signs involving the musculoskeletal system  Pain in left elbow  Acute pain of left shoulder    Problem List Patient Active Problem List   Diagnosis Date Noted  . Proximal humerus fracture 02/16/2017  . Insomnia, controlled 01/06/2017  . MS (multiple sclerosis) ([place]) 12/09/2013   Gu[place]upe Sabin, OTR/L  [phone] 04/17/2017, 3:05 PM  [facility] [address] [place], [place], [address] Phone: [phone]   Fax:  [phone]  Name: ANNICK DIMAIO MRN: [id number] Date of Birth: 1956-06-10

## 2017-04-20 ENCOUNTER — Encounter (HOSPITAL_COMMUNITY): Payer: Medicare Other | Admitting: Specialist

## 2017-04-22 ENCOUNTER — Encounter (HOSPITAL_COMMUNITY): Payer: Self-pay | Admitting: Specialist

## 2017-04-22 ENCOUNTER — Ambulatory Visit (HOSPITAL_COMMUNITY): Payer: Medicare Other | Admitting: Specialist

## 2017-04-22 DIAGNOSIS — M25612 Stiffness of left shoulder, not elsewhere classified: Secondary | ICD-10-CM

## 2017-04-22 DIAGNOSIS — M25622 Stiffness of left elbow, not elsewhere classified: Secondary | ICD-10-CM | POA: Diagnosis not present

## 2017-04-22 DIAGNOSIS — R29898 Other symptoms and signs involving the musculoskeletal system: Secondary | ICD-10-CM

## 2017-04-22 DIAGNOSIS — M25512 Pain in left shoulder: Secondary | ICD-10-CM

## 2017-04-22 NOTE — Therapy (Signed)
[facility] [address] [place], [place], [address] Phone: [phone]   Fax:  [phone]  Occupational Therapy Treatment  Patient Details  Name: [name] MRN: [id number] Date of Birth: 1956-03-13 Referring Provider: Dr. Marcene Duos  Encounter Date: 04/22/2017      OT End of Session - 04/22/17 1626    Visit Number 14   Number of Visits 36   Authorization Type [employer] medicare   Authorization Time Period before 20th visit   Authorization - Visit Number 14   Authorization - Number of Visits 20   OT Start Time 6962   OT Stop Time 1344   OT Time Calculation (min) 41 min   Activity Tolerance Patient tolerated treatment well   Behavior During Therapy [facility] for tasks assessed/performed      Past Medical History:  Diagnosis Date  . Arthritis    Osteoarthritis  . Colon polyps   . GERD (gastroesophageal reflux disease)   . Hiatal hernia   . Hypertension   . MS (multiple sclerosis) ([place]) 12/09/2013  . Multiple sclerosis ([place])   . Seizures ([place]) 01/04/2005   1 and Only    Past Surgical History:  Procedure Laterality Date  . COLONOSCOPY    . COLONOSCOPY N/A 09/16/2012   Procedure: COLONOSCOPY;  Surgeon: Rogene Houston, MD;  [facility];  Service: Endoscopy;  Laterality: N/A;  1030-rescheduled to 9:30 Ann notified pt  . DILATION AND CURETTAGE OF UTERUS    . ORIF HUMERUS FRACTURE Left 02/16/2017   Procedure: OPEN REDUCTION INTERNAL FIXATION (ORIF) LEFT PROXIMAL HUMERUS FRACTURE;  Surgeon: Meredith Pel, MD;  Location: [facility];  Service: Orthopedics;  Laterality: Left;    There were no vitals filed for this visit.      Subjective Assessment - 04/22/17 1625    Subjective  S:  Its getting better   Currently in Pain? No/denies            [facility] OT Assessment - 04/22/17 0001      Assessment   Diagnosis Left shoulder humerus fracture fixation     Precautions   Precautions Shoulder   Type of Shoulder  Precautions 10/12: Ok to begin strengthening of LUE. Fracture has healed. Continue therapy 1-2x a week for 6 weeks.                   OT Treatments/Exercises (OP) - 04/22/17 0001      Exercises   Exercises Elbow;Shoulder     Shoulder Exercises: Supine   Protraction PROM;5 reps;AROM;20 reps   Horizontal ABduction PROM;5 reps;AROM;20 reps   External Rotation PROM;5 reps;AROM;20 reps   Internal Rotation PROM;5 reps;AROM;20 reps   Flexion PROM;5 reps;AROM;20 reps   ABduction PROM;5 reps;AROM;20 reps     Shoulder Exercises: Seated   Protraction AROM;15 reps   Horizontal ABduction AROM;15 reps   External Rotation AROM;15 reps   Internal Rotation AROM;15 reps   Flexion AROM;15 reps   Abduction AROM;15 reps     Shoulder Exercises: Therapy Ball   Right/Left 5 reps     Shoulder Exercises: ROM/Strengthening   X to V Arms 10 times each at 75% range    Proximal Shoulder Strengthening, Supine 10X each no rest breaks   Proximal Shoulder Strengthening, Seated 10X no rest breaks   Other ROM/Strengthening Exercises pvc pilpe slide into flexion and abduction 10 times each      Manual Therapy   Manual Therapy Myofascial release   Manual therapy  comments Manual therapy completed prior to exercises   Myofascial Release Myofascial release and manual stretching completed to left upper ar,. trapezius, and scapularis region to decrease fascial restrictions and increase joint mobility in a pain free zone.                   OT Short Term Goals - 04/03/17 1208      OT SHORT TERM GOAL #1   Title Patient will be educated and independent with HEP to faciliate progress in therapy and return to use LUE for daily tasks.    Time 6   Period Weeks   Status On-going     OT SHORT TERM GOAL #2   Title Patient will decrease edema in LUE by 1 cm or more as needed to increase functional mobility needed to complete light housekeeping tasks.    Time 6   Period Weeks     OT SHORT TERM GOAL  #3   Title Patient will decrease fascial restrictions in LUE to mod amount to increase functional mobility needed to complete dressing activities.   Time 6   Period Weeks     OT SHORT TERM GOAL #4   Title Patient will increase LUE strength (shoulder and elbow) to 4-/5 to increase ability to complete household tasks below shoulder level.    Time 6   Period Weeks   Status On-going     OT SHORT TERM GOAL #5   Title Patient will report a pain score of 5/10 or less when using LUE for daily tasks.    Time 6   Status On-going     OT SHORT TERM GOAL #6   Title Patient will increase P/ROM of LUE (shoulder and elbow) to [facility] to increase ability to complete dressing tasks.    Time 6   Period Weeks   Status On-going           OT Long Term Goals - 03/04/17 1732      OT LONG TERM GOAL #1   Title Patient will return to highest level of independence with all daily tasks while using LUE.    Time 12   Period Weeks   Status On-going     OT LONG TERM GOAL #2   Title Patient will decrease edema in LUE by 2-3 cm in order to increase functional mobility needed for overhead reaching tasks.    Time 12   Period Weeks   Status On-going     OT LONG TERM GOAL #3   Title Patient will decrease fascial restrictions in LUE to min amount or less to increase functional mobility needed to complete dressing tasks such as taking bra on and off.   Time 12   Period Weeks   Status On-going     OT LONG TERM GOAL #4   Title Patient will increase LUE strength (shoulder and elbow) to 4/5 to increase ability to return to normal household and daily lifting tasks.    Time 12   Period Weeks   Status On-going     OT LONG TERM GOAL #5   Title Patient will increase A/ROM of LUE (shoulder and elbow) to [facility] to increase ability to complete tasks overhead easier.    Time 12   Period Weeks   Status On-going     OT LONG TERM GOAL #6   Title Patient will report a pain level of 3/10 when completing daily tasks.     Time 12   Period Weeks  Status On-going               Plan - 04/22/17 1626    Clinical Impression Statement A:  Patient reports feeling a good stretch with PVC pipe exercise.  added x to v for improved proximal shoulder strength needed for overhead reaching.    Plan P:  add sidelying a/rom for improved flexion and abduction.        Patient will benefit from skilled therapeutic intervention in order to improve the following deficits and impairments:  Decreased scar mobility, Increased edema, Decreased range of motion, Increased fascial restricitons, Impaired UE functional use, Pain, Decreased strength, Decreased knowledge of use of DME  Visit Diagnosis: Stiffness of left shoulder, not elsewhere classified  Other symptoms and signs involving the musculoskeletal system  Acute pain of left shoulder    Problem List Patient Active Problem List   Diagnosis Date Noted  . Proximal humerus fracture 02/16/2017  . Insomnia, controlled 01/06/2017  . MS (multiple sclerosis) ([place]) 12/09/2013    Vangie Bicker, [place], OTR/L [phone]  04/22/2017, 4:30 PM  [facility] [address] [place], [place], [address] Phone: [phone]   Fax:  [phone]  Name: [name] MRN: [id number] Date of Birth: 03-28-1956

## 2017-04-24 ENCOUNTER — Encounter (HOSPITAL_COMMUNITY): Payer: Self-pay | Admitting: Occupational Therapy

## 2017-04-24 ENCOUNTER — Ambulatory Visit (HOSPITAL_COMMUNITY): Payer: Medicare Other | Attending: Family Medicine | Admitting: Occupational Therapy

## 2017-04-24 DIAGNOSIS — M25522 Pain in left elbow: Secondary | ICD-10-CM | POA: Diagnosis present

## 2017-04-24 DIAGNOSIS — M25512 Pain in left shoulder: Secondary | ICD-10-CM | POA: Diagnosis present

## 2017-04-24 DIAGNOSIS — R29898 Other symptoms and signs involving the musculoskeletal system: Secondary | ICD-10-CM | POA: Diagnosis present

## 2017-04-24 DIAGNOSIS — R600 Localized edema: Secondary | ICD-10-CM | POA: Diagnosis present

## 2017-04-24 DIAGNOSIS — M25622 Stiffness of left elbow, not elsewhere classified: Secondary | ICD-10-CM | POA: Insufficient documentation

## 2017-04-24 DIAGNOSIS — M25612 Stiffness of left shoulder, not elsewhere classified: Secondary | ICD-10-CM

## 2017-04-24 NOTE — Therapy (Signed)
[facility] [place], [place], [address] Phone: [phone]   Fax:  [phone]  Occupational Therapy Treatment  Patient Details  Name: [name] MRN: [id number] Date of Birth: 05-24-1956 Referring Provider: Dr. Marcene Duos  Encounter Date: 04/24/2017      OT End of Session - 04/24/17 1350    Visit Number 15   Number of Visits 36   Authorization Type [employer] medicare   Authorization Time Period before 20th visit   Authorization - Visit Number 15   Authorization - Number of Visits 20   OT Start Time 1301   OT Stop Time 1346   OT Time Calculation (min) 45 min   Activity Tolerance Patient tolerated treatment well   Behavior During Therapy [facility] for tasks assessed/performed      Past Medical History:  Diagnosis Date  . Arthritis    Osteoarthritis  . Colon polyps   . GERD (gastroesophageal reflux disease)   . Hiatal hernia   . Hypertension   . MS (multiple sclerosis) ([place]) 12/09/2013  . Multiple sclerosis ([place])   . Seizures ([place]) 01/04/2005   1 and Only    Past Surgical History:  Procedure Laterality Date  . COLONOSCOPY    . COLONOSCOPY N/A 09/16/2012   Procedure: COLONOSCOPY;  Surgeon: Rogene Houston, MD;  [facility];  Service: Endoscopy;  Laterality: N/A;  1030-rescheduled to 9:30 Ann notified pt  . DILATION AND CURETTAGE OF UTERUS    . ORIF HUMERUS FRACTURE Left 02/16/2017   Procedure: OPEN REDUCTION INTERNAL FIXATION (ORIF) LEFT PROXIMAL HUMERUS FRACTURE;  Surgeon: Meredith Pel, MD;  Location: [facility];  Service: Orthopedics;  Laterality: Left;    There were no vitals filed for this visit.      Subjective Assessment - 04/24/17 1301    Subjective  S: It was a little sore this morning but I don't know why.   Currently in Pain? No/denies            [facility] OT Assessment - 04/24/17 1300      Assessment   Diagnosis Left shoulder humerus fracture fixation     Precautions   Precautions  Shoulder   Type of Shoulder Precautions 10/12: Ok to begin strengthening of LUE. Fracture has healed. Continue therapy 1-2x a week for 6 weeks.                   OT Treatments/Exercises (OP) - 04/24/17 1303      Exercises   Exercises Elbow;Shoulder     Shoulder Exercises: Supine   Protraction PROM;5 reps;AROM;20 reps   Horizontal ABduction PROM;5 reps;AROM;20 reps   External Rotation PROM;5 reps;AROM;20 reps   Internal Rotation PROM;5 reps;AROM;20 reps   Flexion PROM;5 reps;AROM;20 reps   ABduction PROM;5 reps;AROM;20 reps     Shoulder Exercises: Seated   Protraction AROM;20 reps   Horizontal ABduction AROM;20 reps   External Rotation AROM;20 reps   Internal Rotation AROM;20 reps   Flexion AROM;20 reps   Abduction AROM;20 reps     Shoulder Exercises: Sidelying   External Rotation AROM;15 reps   Internal Rotation AROM;15 reps   Flexion AROM;15 reps   ABduction AROM;15 reps   Other Sidelying Exercises protraction, A/ROM, 20X   Other Sidelying Exercises horizontal abduction, A/ROM, 15X     Shoulder Exercises: Standing   Other Standing Exercises PVC Pipe slide, 10X, A/ROM     Shoulder Exercises: Therapy Ball   Right/Left 5 reps  Shoulder Exercises: ROM/Strengthening   X to V Arms 10 times each at 75% range    Proximal Shoulder Strengthening, Supine 10X each no rest breaks     Manual Therapy   Manual Therapy Myofascial release   Manual therapy comments Manual therapy completed prior to exercises   Myofascial Release Myofascial release and manual stretching completed to left upper ar,. trapezius, and scapularis region to decrease fascial restrictions and increase joint mobility in a pain free zone.                   OT Short Term Goals - 04/03/17 1208      OT SHORT TERM GOAL #1   Title Patient will be educated and independent with HEP to faciliate progress in therapy and return to use LUE for daily tasks.    Time 6   Period Weeks   Status  On-going     OT SHORT TERM GOAL #2   Title Patient will decrease edema in LUE by 1 cm or more as needed to increase functional mobility needed to complete light housekeeping tasks.    Time 6   Period Weeks     OT SHORT TERM GOAL #3   Title Patient will decrease fascial restrictions in LUE to mod amount to increase functional mobility needed to complete dressing activities.   Time 6   Period Weeks     OT SHORT TERM GOAL #4   Title Patient will increase LUE strength (shoulder and elbow) to 4-/5 to increase ability to complete household tasks below shoulder level.    Time 6   Period Weeks   Status On-going     OT SHORT TERM GOAL #5   Title Patient will report a pain score of 5/10 or less when using LUE for daily tasks.    Time 6   Status On-going     OT SHORT TERM GOAL #6   Title Patient will increase P/ROM of LUE (shoulder and elbow) to [facility] to increase ability to complete dressing tasks.    Time 6   Period Weeks   Status On-going           OT Long Term Goals - 03/04/17 1732      OT LONG TERM GOAL #1   Title Patient will return to highest level of independence with all daily tasks while using LUE.    Time 12   Period Weeks   Status On-going     OT LONG TERM GOAL #2   Title Patient will decrease edema in LUE by 2-3 cm in order to increase functional mobility needed for overhead reaching tasks.    Time 12   Period Weeks   Status On-going     OT LONG TERM GOAL #3   Title Patient will decrease fascial restrictions in LUE to min amount or less to increase functional mobility needed to complete dressing tasks such as taking bra on and off.   Time 12   Period Weeks   Status On-going     OT LONG TERM GOAL #4   Title Patient will increase LUE strength (shoulder and elbow) to 4/5 to increase ability to return to normal household and daily lifting tasks.    Time 12   Period Weeks   Status On-going     OT LONG TERM GOAL #5   Title Patient will increase A/ROM of LUE  (shoulder and elbow) to [facility] to increase ability to complete tasks overhead easier.    Time  12   Period Weeks   Status On-going     OT LONG TERM GOAL #6   Title Patient will report a pain level of 3/10 when completing daily tasks.    Time 12   Period Weeks   Status On-going               Plan - 04/24/17 1350    Clinical Impression Statement A: Pt reports she did not complete her exercises yesterday, is tight today. Added sidelying A/ROM, pt acheiving 50-75% range during flexion and abduction. Verbal cuing for form and technique, occasional rest breaks for fatigue.    Plan P: Reassessment   OT Home Exercise Plan 9/10: elbow exercises, yellow theraputty, edema management   Consulted and Agree with Plan of Care Patient      Patient will benefit from skilled therapeutic intervention in order to improve the following deficits and impairments:  Decreased scar mobility, Increased edema, Decreased range of motion, Increased fascial restricitons, Impaired UE functional use, Pain, Decreased strength, Decreased knowledge of use of DME  Visit Diagnosis: Stiffness of left shoulder, not elsewhere classified  Other symptoms and signs involving the musculoskeletal system  Acute pain of left shoulder    Problem List Patient Active Problem List   Diagnosis Date Noted  . Proximal humerus fracture 02/16/2017  . Insomnia, controlled 01/06/2017  . MS (multiple sclerosis) ([place]) 12/09/2013   [place] Sabin, OTR/L  [phone] 04/24/2017, 1:53 PM  [facility] [address] [place], [place], [address] Phone: [phone]   Fax:  [phone]  Name: HAROLD MATTES MRN: [id number] Date of Birth: 1955-11-24

## 2017-04-27 ENCOUNTER — Encounter (HOSPITAL_COMMUNITY): Payer: Medicare Other

## 2017-04-29 ENCOUNTER — Ambulatory Visit (HOSPITAL_COMMUNITY): Payer: Medicare Other

## 2017-04-29 ENCOUNTER — Encounter (HOSPITAL_COMMUNITY): Payer: Self-pay

## 2017-04-29 DIAGNOSIS — M25612 Stiffness of left shoulder, not elsewhere classified: Secondary | ICD-10-CM

## 2017-04-29 DIAGNOSIS — M25622 Stiffness of left elbow, not elsewhere classified: Secondary | ICD-10-CM

## 2017-04-29 DIAGNOSIS — R29898 Other symptoms and signs involving the musculoskeletal system: Secondary | ICD-10-CM

## 2017-04-29 DIAGNOSIS — M25522 Pain in left elbow: Secondary | ICD-10-CM

## 2017-04-29 DIAGNOSIS — M25512 Pain in left shoulder: Secondary | ICD-10-CM

## 2017-04-29 NOTE — Therapy (Signed)
[facility] [place], [place], [address] Phone: [phone]   Fax:  [phone]  Occupational Therapy Treatment And reassessment/re-cert  Patient Details  Name: [name] MRN: [id number] Date of Birth: 12-29-1955 Referring Provider: Dr. Marcene Duos   Encounter Date: 04/29/2017  OT End of Session - 04/29/17 1410    Visit Number  16    Number of Visits  78    Date for OT Re-Evaluation  05/29/17    Authorization Type  [employer] medicare    Authorization Time Period  before 20th visit    Authorization - Visit Number  16    Authorization - Number of Visits  20    OT Start Time  7564    OT Stop Time  1350    OT Time Calculation (min)  45 min    Activity Tolerance  Patient tolerated treatment well    Behavior During Therapy  [facility] for tasks assessed/performed       Past Medical History:  Diagnosis Date  . Arthritis    Osteoarthritis  . Colon polyps   . GERD (gastroesophageal reflux disease)   . Hiatal hernia   . Hypertension   . MS (multiple sclerosis) ([place]) 12/09/2013  . Multiple sclerosis ([place])   . Seizures ([place]) 01/04/2005   1 and Only    Past Surgical History:  Procedure Laterality Date  . COLONOSCOPY    . DILATION AND CURETTAGE OF UTERUS      There were no vitals filed for this visit.  Subjective Assessment - 04/29/17 1627    Subjective   S: I knew you would be surprised with how much I'm moving.     Currently in Pain?  No/denies         [facility] OT Assessment - 04/29/17 1308      Assessment   Diagnosis  Left shoulder humerus fracture fixation      Precautions   Precautions  Shoulder    Type of Shoulder Precautions  10/12: Ok to begin strengthening of LUE. Fracture has healed. Continue therapy 1-2x a week for 6 weeks.       ROM / Strength   AROM / PROM / Strength  AROM;PROM;Strength      AROM   Overall AROM Comments  Assessed seated. IR/er adducted    AROM Assessment Site  Shoulder;Elbow    Right/Left Shoulder  Left    Left Shoulder Flexion  135 Degrees previous: 85   previous: 85   Left Shoulder ABduction  130 Degrees previous: 85   previous: 85   Left Shoulder Internal Rotation  90 Degrees previous: same   previous: same   Left Shoulder External Rotation  68 Degrees previous: 60   previous: 60   Right/Left Elbow  Left    Left Elbow Extension  0      PROM   Overall PROM Comments  Assessed supine. IR/er adducted,    PROM Assessment Site  Shoulder    Right/Left Shoulder  Left    Left Shoulder Flexion  154 Degrees previous: 110   previous: 110   Left Shoulder ABduction  180 Degrees previous: 85   previous: 85   Left Shoulder Internal Rotation  90 Degrees previous: same   previous: same   Left Shoulder External Rotation  60 Degrees previous: 55   previous: 55   Left Elbow Extension  0 previous: -6   previous: -6     Strength   Overall Strength Comments  Assessed seated; IR/er adducted. Strength not assessed this session.     Strength Assessment Site  Shoulder;Elbow    Right/Left Shoulder  Left    Left Shoulder Flexion  3+/5    Left Shoulder ABduction  4-/5    Left Shoulder Internal Rotation  4/5    Left Shoulder External Rotation  3/5    Right/Left Elbow  Left    Left Elbow Flexion  4+/5    Left Elbow Extension  4/5    Left Hand Grip (lbs)  50 previous: 41   previous: 41                      OT Education - 04/29/17 1409    Education provided  Yes    Education Details  reviewed goals and progress in therapy. Discussed continuing therapy to focus on strength, use of LUE, and ROM.     Person(s) Educated  Patient    Methods  Explanation    Comprehension  Verbalized understanding       OT Short Term Goals - 04/29/17 1344      OT SHORT TERM GOAL #1   Title  Patient will be educated and independent with HEP to faciliate progress in therapy and return to use LUE for daily tasks.     Time  6    Period  Weeks    Status  Achieved      OT  SHORT TERM GOAL #2   Title  Patient will decrease edema in LUE by 1 cm or more as needed to increase functional mobility needed to complete light housekeeping tasks.     Time  6    Period  Weeks      OT SHORT TERM GOAL #3   Title  Patient will decrease fascial restrictions in LUE to mod amount to increase functional mobility needed to complete dressing activities.    Time  6    Period  Weeks      OT SHORT TERM GOAL #4   Title  Patient will increase LUE strength (shoulder and elbow) to 4-/5 to increase ability to complete household tasks below shoulder level.     Time  6    Period  Weeks    Status  Partially Met      OT SHORT TERM GOAL #5   Title  Patient will report a pain score of 5/10 or less when using LUE for daily tasks.     Time  6    Status  Achieved      OT SHORT TERM GOAL #6   Title  Patient will increase P/ROM of LUE (shoulder and elbow) to [facility] to increase ability to complete dressing tasks.     Time  6    Period  Weeks    Status  Achieved        OT Long Term Goals - 04/29/17 1346      OT LONG TERM GOAL #1   Title  Patient will return to highest level of independence with all daily tasks while using LUE.     Time  12    Period  Weeks    Status  On-going      OT LONG TERM GOAL #2   Title  Patient will decrease edema in LUE by 2-3 cm in order to increase functional mobility needed for overhead reaching tasks.     Time  12    Period  Weeks    Status  Achieved      OT LONG TERM GOAL #3   Title  Patient will decrease fascial restrictions in LUE to min amount or less to increase functional mobility needed to complete dressing tasks such as taking bra on and off.    Time  12    Period  Weeks    Status  Achieved      OT LONG TERM GOAL #4   Title  Patient will increase LUE strength (shoulder and elbow) to 4/5 to increase ability to return to normal household and daily lifting tasks.     Time  12    Period  Weeks    Status  On-going      OT LONG TERM GOAL #5    Title  Patient will increase A/ROM of LUE (shoulder and elbow) to [facility] to increase ability to complete tasks overhead easier.     Time  12    Period  Weeks    Status  Partially Met      OT LONG TERM GOAL #6   Title  Patient will report a pain level of 3/10 when completing daily tasks.     Time  12    Period  Weeks    Status  Achieved            Plan - 04/29/17 1413    Clinical Impression Statement  A: Reassessment completed this date. patient has met all short term goals with the exception of a strength go which was partially met. patient is progressing towards long term goals which she has met 3 long terms. Pt continues to show deficits in strength and range of motion (primarily flexion and extension).      OT Treatment/Interventions  Self-care/ADL training;Ultrasound;Manual Therapy;Splinting;Patient/family education;Therapeutic exercise;Iontophoresis;Therapeutic activities;Compression bandaging;DME and/or AE instruction;Parrafin;Cryotherapy;Electrical Stimulation;Moist Heat;Contrast Bath;Passive range of motion;Scar mobilization    Plan  P: Continue OT services 2 times a week for 4 more weeks to focus on the mentioned deficits. Begin strengthening for shoulder and elbow. Complete FOTO.        Patient will benefit from skilled therapeutic intervention in order to improve the following deficits and impairments:  Decreased scar mobility, Increased edema, Decreased range of motion, Increased fascial restricitons, Impaired UE functional use, Pain, Decreased strength, Decreased knowledge of use of DME  Visit Diagnosis: Stiffness of left shoulder, not elsewhere classified - Plan: Ot plan of care cert/re-cert  Other symptoms and signs involving the musculoskeletal system - Plan: Ot plan of care cert/re-cert  Acute pain of left shoulder - Plan: Ot plan of care cert/re-cert  Stiffness of left elbow, not elsewhere classified - Plan: Ot plan of care cert/re-cert  Pain in left elbow - Plan:  Ot plan of care cert/re-cert  G-Codes - 16/60/63 1442    Functional Assessment Tool Used (Outpatient only)  Clinical judgement    Functional Limitation  Self care    Self Care Current Status [phone])  At least 40 percent but less than 60 percent impaired, limited or restricted    Self Care Goal Status (U9323)  At least 20 percent but less than 40 percent impaired, limited or restricted       Problem List Patient Active Problem List   Diagnosis Date Noted  . Proximal humerus fracture 02/16/2017  . Insomnia, controlled 01/06/2017  . MS (multiple sclerosis) ([place]) 12/09/2013    Ailene Ravel, OTR/L,CBIS  [phone]   04/29/2017, 4:35 PM  [facility] [place], [place], [address]  Phone: [phone]   Fax:  [phone]  Name: [name] MRN: [id number] Date of Birth: [birth year]

## 2017-05-01 ENCOUNTER — Encounter (HOSPITAL_COMMUNITY): Payer: Self-pay | Admitting: Occupational Therapy

## 2017-05-01 ENCOUNTER — Ambulatory Visit (HOSPITAL_COMMUNITY): Payer: Medicare Other | Admitting: Occupational Therapy

## 2017-05-01 DIAGNOSIS — M25512 Pain in left shoulder: Secondary | ICD-10-CM

## 2017-05-01 DIAGNOSIS — M25612 Stiffness of left shoulder, not elsewhere classified: Secondary | ICD-10-CM | POA: Diagnosis not present

## 2017-05-01 DIAGNOSIS — R29898 Other symptoms and signs involving the musculoskeletal system: Secondary | ICD-10-CM

## 2017-05-01 NOTE — Therapy (Signed)
[place], [place], [address] Phone: [phone]   Fax:  [phone]  Occupational Therapy Treatment  Patient Details  Name: AQUARIUS TREMPER MRN: [id number] Date of Birth: 1956/04/27 Referring Provider: Dr. Marcene Duos   Encounter Date: 05/01/2017  OT End of Session - 05/01/17 1443    Visit Number  17    Number of Visits  8    Date for OT Re-Evaluation  05/29/17    Authorization Type  [employer] medicare    Authorization Time Period  before 26th visit    Authorization - Visit Number  17    Authorization - Number of Visits  26    OT Start Time  1300    OT Stop Time  1343    OT Time Calculation (min)  43 min    Activity Tolerance  Patient tolerated treatment well    Behavior During Therapy  [facility] for tasks assessed/performed       Past Medical History:  Diagnosis Date  . Arthritis    Osteoarthritis  . Colon polyps   . GERD (gastroesophageal reflux disease)   . Hiatal hernia   . Hypertension   . MS (multiple sclerosis) ([place]) 12/09/2013  . Multiple sclerosis ([place])   . Seizures ([place]) 01/04/2005   1 and Only    Past Surgical History:  Procedure Laterality Date  . COLONOSCOPY    . DILATION AND CURETTAGE OF UTERUS      There were no vitals filed for this visit.  Subjective Assessment - 05/01/17 1300    Subjective   S: I think my strength is pretty good.     Special Tests  FOTO score: 62/100    Currently in Pain?  No/denies         [facility] OT Assessment - 05/01/17 1300      Assessment   Diagnosis  Left shoulder humerus fracture fixation      Precautions   Precautions  Shoulder    Type of Shoulder Precautions  10/12: Ok to begin strengthening of LUE. Fracture has healed. Continue therapy 1-2x a week for 6 weeks.                OT Treatments/Exercises (OP) - 05/01/17 1304      Exercises   Exercises  Elbow;Shoulder      Shoulder Exercises: Supine   Protraction  PROM;5 reps;Strengthening;10  reps    Protraction Weight (lbs)  1    Horizontal ABduction  PROM;5 reps;Strengthening;10 reps    Horizontal ABduction Weight (lbs)  1    External Rotation  PROM;5 reps;Strengthening;10 reps    External Rotation Weight (lbs)  1    Internal Rotation  PROM;5 reps;Strengthening;10 reps    Internal Rotation Weight (lbs)  1    Flexion  PROM;5 reps;Strengthening;10 reps    Shoulder Flexion Weight (lbs)  1    ABduction  PROM;5 reps;Strengthening;10 reps    Shoulder ABduction Weight (lbs)  1      Shoulder Exercises: Seated   Protraction  Strengthening;10 reps    Protraction Weight (lbs)  1    Horizontal ABduction  Strengthening;10 reps    Horizontal ABduction Weight (lbs)  1    External Rotation  Strengthening;10 reps    External Rotation Weight (lbs)  1    Internal Rotation  Strengthening;10 reps    Internal Rotation Weight (lbs)  1    Flexion  Strengthening;10 reps    Flexion Weight (lbs)  1  Abduction  Strengthening;10 reps    ABduction Weight (lbs)  1      Shoulder Exercises: Standing   Extension  Theraband;10 reps    Theraband Level (Shoulder Extension)  Level 2 (Red)    Row  Theraband;10 reps    Theraband Level (Shoulder Row)  Level 2 (Red)    Retraction  Theraband;10 reps    Theraband Level (Shoulder Retraction)  Level 2 (Red)      Shoulder Exercises: ROM/Strengthening   UBE (Upper Arm Bike)  Level 1 1' forward 1' reverse    X to V Arms  10 times each at 75% range     Proximal Shoulder Strengthening, Seated  10X, 1# no rest breaks      Elbow Exercises   Bar Weights/Barbell (Elbow Flexion)  2 lbs    Elbow Extension  Strengthening;10 reps hammer curl and bicep curl    Bar Weights/Barbell (Elbow Extension)  2 lbs    Forearm Supination  Strengthening;10 reps    Forearm Supination Limitations  2    Forearm Pronation  Strengthening;10 reps 2#    Other elbow exercises  elbow flexion-hammer curl and bicep curl, 10X      Manual Therapy   Manual Therapy  Myofascial release     Manual therapy comments  Manual therapy completed prior to exercises    Myofascial Release  Myofascial release and manual stretching completed to left upper ar,. trapezius, and scapularis region to decrease fascial restrictions and increase joint mobility in a pain free zone.                OT Short Term Goals - 04/29/17 1344      OT SHORT TERM GOAL #1   Title  Patient will be educated and independent with HEP to faciliate progress in therapy and return to use LUE for daily tasks.     Time  6    Period  Weeks    Status  Achieved      OT SHORT TERM GOAL #2   Title  Patient will decrease edema in LUE by 1 cm or more as needed to increase functional mobility needed to complete light housekeeping tasks.     Time  6    Period  Weeks      OT SHORT TERM GOAL #3   Title  Patient will decrease fascial restrictions in LUE to mod amount to increase functional mobility needed to complete dressing activities.    Time  6    Period  Weeks      OT SHORT TERM GOAL #4   Title  Patient will increase LUE strength (shoulder and elbow) to 4-/5 to increase ability to complete household tasks below shoulder level.     Time  6    Period  Weeks    Status  Partially Met      OT SHORT TERM GOAL #5   Title  Patient will report a pain score of 5/10 or less when using LUE for daily tasks.     Time  6    Status  Achieved      OT SHORT TERM GOAL #6   Title  Patient will increase P/ROM of LUE (shoulder and elbow) to [facility] to increase ability to complete dressing tasks.     Time  6    Period  Weeks    Status  Achieved        OT Long Term Goals - 04/29/17 1346      OT  LONG TERM GOAL #1   Title  Patient will return to highest level of independence with all daily tasks while using LUE.     Time  12    Period  Weeks    Status  On-going      OT LONG TERM GOAL #2   Title  Patient will decrease edema in LUE by 2-3 cm in order to increase functional mobility needed for overhead reaching tasks.      Time  12    Period  Weeks    Status  Achieved      OT LONG TERM GOAL #3   Title  Patient will decrease fascial restrictions in LUE to min amount or less to increase functional mobility needed to complete dressing tasks such as taking bra on and off.    Time  12    Period  Weeks    Status  Achieved      OT LONG TERM GOAL #4   Title  Patient will increase LUE strength (shoulder and elbow) to 4/5 to increase ability to return to normal household and daily lifting tasks.     Time  12    Period  Weeks    Status  On-going      OT LONG TERM GOAL #5   Title  Patient will increase A/ROM of LUE (shoulder and elbow) to [facility] to increase ability to complete tasks overhead easier.     Time  12    Period  Weeks    Status  Partially Met      OT LONG TERM GOAL #6   Title  Patient will report a pain level of 3/10 when completing daily tasks.     Time  12    Period  Weeks    Status  Achieved            Plan - 05/01/17 1443    Clinical Impression Statement  A: Progressed to strengthening in supine and sitting this date using 1# weight, began elbow strengthening with 2# weight. Added scapular theraband, pt requiring verbal cuing for form and technique. Pt reports min fatigue at end of session, no pain noted during session.     Plan  P: Continue with LUE strengthening, add overhead lacing    OT Home Exercise Plan  9/10: elbow exercises, yellow theraputty, edema management    Consulted and Agree with Plan of Care  Patient       Patient will benefit from skilled therapeutic intervention in order to improve the following deficits and impairments:  Decreased scar mobility, Increased edema, Decreased range of motion, Increased fascial restricitons, Impaired UE functional use, Pain, Decreased strength, Decreased knowledge of use of DME  Visit Diagnosis: Stiffness of left shoulder, not elsewhere classified  Other symptoms and signs involving the musculoskeletal system  Acute pain of left  shoulder    Problem List Patient Active Problem List   Diagnosis Date Noted  . Proximal humerus fracture 02/16/2017  . Insomnia, controlled 01/06/2017  . MS (multiple sclerosis) ([place]) 12/09/2013   Guadelupe Sabin, OTR/L  [id number] 05/01/2017, 2:47 PM  [place] [address] [place], [place], [address] Phone: [phone]   Fax:  [phone]  Name: TAMAIYA BUMP MRN: [id number] Date of Birth: 11/08/1955

## 2017-05-04 ENCOUNTER — Encounter (HOSPITAL_COMMUNITY): Payer: Self-pay | Admitting: Orthopedic Surgery

## 2017-05-04 ENCOUNTER — Encounter (HOSPITAL_COMMUNITY): Payer: Medicare Other

## 2017-05-06 ENCOUNTER — Encounter (HOSPITAL_COMMUNITY): Payer: Self-pay

## 2017-05-06 ENCOUNTER — Ambulatory Visit (HOSPITAL_COMMUNITY): Payer: Medicare Other

## 2017-05-06 DIAGNOSIS — M25612 Stiffness of left shoulder, not elsewhere classified: Secondary | ICD-10-CM

## 2017-05-06 DIAGNOSIS — M25622 Stiffness of left elbow, not elsewhere classified: Secondary | ICD-10-CM

## 2017-05-06 DIAGNOSIS — R29898 Other symptoms and signs involving the musculoskeletal system: Secondary | ICD-10-CM

## 2017-05-06 DIAGNOSIS — M25512 Pain in left shoulder: Secondary | ICD-10-CM

## 2017-05-06 DIAGNOSIS — M25522 Pain in left elbow: Secondary | ICD-10-CM

## 2017-05-06 DIAGNOSIS — R600 Localized edema: Secondary | ICD-10-CM

## 2017-05-06 NOTE — Therapy (Signed)
[place], [place], [address] Phone: [phone]   Fax:  [phone]  Occupational Therapy Treatment  Patient Details  Name: [name] MRN: [id number] Date of Birth: 1955/12/10 Referring Provider: Dr. Marcene Duos   Encounter Date: 05/06/2017  OT End of Session - 05/06/17 1351    Visit Number  18    Number of Visits  36    Date for OT Re-Evaluation  05/29/17    Authorization Type  [employer] medicare    Authorization Time Period  before 26th visit    Authorization - Visit Number  18    Authorization - Number of Visits  26    OT Start Time  2549    OT Stop Time  1345    OT Time Calculation (min)  40 min    Activity Tolerance  Patient tolerated treatment well    Behavior During Therapy  [facility] for tasks assessed/performed       Past Medical History:  Diagnosis Date  . Arthritis    Osteoarthritis  . Colon polyps   . GERD (gastroesophageal reflux disease)   . Hiatal hernia   . Hypertension   . MS (multiple sclerosis) ([place]) 12/09/2013  . Multiple sclerosis ([place])   . Seizures ([place]) 01/04/2005   1 and Only    Past Surgical History:  Procedure Laterality Date  . COLONOSCOPY    . DILATION AND CURETTAGE OF UTERUS      There were no vitals filed for this visit.  Subjective Assessment - 05/06/17 1321    Subjective   S: I'm hoping to be done here by Christmas.    Currently in Pain?  No/denies         [facility] OT Assessment - 05/06/17 1322      Assessment   Diagnosis  Left shoulder humerus fracture fixation      Precautions   Precautions  Shoulder    Type of Shoulder Precautions  10/12: Ok to begin strengthening of LUE. Fracture has healed. Continue therapy 1-2x a week for 6 weeks.                OT Treatments/Exercises (OP) - 05/06/17 1323      Exercises   Exercises  Elbow;Shoulder      Shoulder Exercises: Supine   Protraction  PROM;5 reps;Strengthening;15 reps    Protraction Weight (lbs)  1     Horizontal ABduction  PROM;5 reps;Strengthening;15 reps    Horizontal ABduction Weight (lbs)  1    External Rotation  PROM;5 reps;Strengthening;15 reps    External Rotation Weight (lbs)  1    Internal Rotation  PROM;5 reps;Strengthening;15 reps    Internal Rotation Weight (lbs)  1    Flexion  PROM;5 reps;Strengthening;15 reps    Shoulder Flexion Weight (lbs)  1    ABduction  PROM;5 reps;Strengthening;15 reps    Shoulder ABduction Weight (lbs)  1      Shoulder Exercises: Seated   Protraction  Strengthening;20 reps    Protraction Weight (lbs)  1    Horizontal ABduction  Strengthening;10 reps    Horizontal ABduction Weight (lbs)  1    External Rotation  Strengthening;15 reps    External Rotation Weight (lbs)  1    Internal Rotation  Strengthening;15 reps    Internal Rotation Weight (lbs)  1    Flexion  Strengthening;10 reps    Flexion Weight (lbs)  1    Abduction  Strengthening;12 reps  ABduction Weight (lbs)  1      Shoulder Exercises: Standing   Extension  Theraband;10 reps    Theraband Level (Shoulder Extension)  Level 2 (Red)    Row  Theraband;10 reps    Theraband Level (Shoulder Row)  Level 2 (Red)    Retraction  Theraband;10 reps    Theraband Level (Shoulder Retraction)  Level 2 (Red)      Shoulder Exercises: ROM/Strengthening   X to V Arms  15 times each at 75% range     Proximal Shoulder Strengthening, Supine  15X with 1# no rest breaks    Proximal Shoulder Strengthening, Seated  10X, 1# no rest breaks      Elbow Exercises   Bar Weights/Barbell (Elbow Flexion)  2 lbs    Elbow Extension  Strengthening;15 reps hammer curl and bicep curl    Bar Weights/Barbell (Elbow Extension)  2 lbs    Forearm Supination  Strengthening;15 reps    Forearm Supination Limitations  2    Forearm Pronation  Strengthening;15 reps 2#    Other elbow exercises  elbow flexion-hammer curl and bicep curl, 15X      Manual Therapy   Manual Therapy  Myofascial release    Manual therapy  comments  Manual therapy completed prior to exercises    Myofascial Release  Myofascial release and manual stretching completed to left upper ar,. trapezius, and scapularis region to decrease fascial restrictions and increase joint mobility in a pain free zone.                OT Short Term Goals - 04/29/17 1344      OT SHORT TERM GOAL #1   Title  Patient will be educated and independent with HEP to faciliate progress in therapy and return to use LUE for daily tasks.     Time  6    Period  Weeks    Status  Achieved      OT SHORT TERM GOAL #2   Title  Patient will decrease edema in LUE by 1 cm or more as needed to increase functional mobility needed to complete light housekeeping tasks.     Time  6    Period  Weeks      OT SHORT TERM GOAL #3   Title  Patient will decrease fascial restrictions in LUE to mod amount to increase functional mobility needed to complete dressing activities.    Time  6    Period  Weeks      OT SHORT TERM GOAL #4   Title  Patient will increase LUE strength (shoulder and elbow) to 4-/5 to increase ability to complete household tasks below shoulder level.     Time  6    Period  Weeks    Status  Partially Met      OT SHORT TERM GOAL #5   Title  Patient will report a pain score of 5/10 or less when using LUE for daily tasks.     Time  6    Status  Achieved      OT SHORT TERM GOAL #6   Title  Patient will increase P/ROM of LUE (shoulder and elbow) to [facility] to increase ability to complete dressing tasks.     Time  6    Period  Weeks    Status  Achieved        OT Long Term Goals - 04/29/17 1346      OT LONG TERM GOAL #1   Title  Patient will return to highest level of independence with all daily tasks while using LUE.     Time  12    Period  Weeks    Status  On-going      OT LONG TERM GOAL #2   Title  Patient will decrease edema in LUE by 2-3 cm in order to increase functional mobility needed for overhead reaching tasks.     Time  12     Period  Weeks    Status  Achieved      OT LONG TERM GOAL #3   Title  Patient will decrease fascial restrictions in LUE to min amount or less to increase functional mobility needed to complete dressing tasks such as taking bra on and off.    Time  12    Period  Weeks    Status  Achieved      OT LONG TERM GOAL #4   Title  Patient will increase LUE strength (shoulder and elbow) to 4/5 to increase ability to return to normal household and daily lifting tasks.     Time  12    Period  Weeks    Status  On-going      OT LONG TERM GOAL #5   Title  Patient will increase A/ROM of LUE (shoulder and elbow) to [facility] to increase ability to complete tasks overhead easier.     Time  12    Period  Weeks    Status  Partially Met      OT LONG TERM GOAL #6   Title  Patient will report a pain level of 3/10 when completing daily tasks.     Time  12    Period  Weeks    Status  Achieved            Plan - 05/06/17 1351    Clinical Impression Statement  A: pt requested to increase repetitions for most strengthening exercises. Required more cueing for scapular strengthening exercises. Overall, did very well during session. Discussed discharge at end of the month.     Plan  P: Add overhead lacing and attempt ball on the wall.       Patient will benefit from skilled therapeutic intervention in order to improve the following deficits and impairments:  Decreased scar mobility, Increased edema, Decreased range of motion, Increased fascial restricitons, Impaired UE functional use, Pain, Decreased strength, Decreased knowledge of use of DME  Visit Diagnosis: Stiffness of left shoulder, not elsewhere classified  Other symptoms and signs involving the musculoskeletal system  Acute pain of left shoulder  Stiffness of left elbow, not elsewhere classified  Pain in left elbow  Localized edema    Problem List Patient Active Problem List   Diagnosis Date Noted  . Proximal humerus fracture 02/16/2017   . Insomnia, controlled 01/06/2017  . MS (multiple sclerosis) ([place]) 12/09/2013   Ailene Ravel, OTR/L,CBIS  [phone]  05/06/2017, 1:53 PM  [place] [address] [place], [place], [address] Phone: [phone]   Fax:  [phone]  Name: RUPINDER LIVINGSTON MRN: [id number] Date of Birth: 1956/03/16

## 2017-05-08 ENCOUNTER — Ambulatory Visit (HOSPITAL_COMMUNITY): Payer: Medicare Other | Admitting: Occupational Therapy

## 2017-05-08 ENCOUNTER — Encounter (HOSPITAL_COMMUNITY): Payer: Self-pay | Admitting: Occupational Therapy

## 2017-05-08 ENCOUNTER — Other Ambulatory Visit: Payer: Self-pay

## 2017-05-08 DIAGNOSIS — M25612 Stiffness of left shoulder, not elsewhere classified: Secondary | ICD-10-CM | POA: Diagnosis not present

## 2017-05-08 DIAGNOSIS — M25512 Pain in left shoulder: Secondary | ICD-10-CM

## 2017-05-08 DIAGNOSIS — R29898 Other symptoms and signs involving the musculoskeletal system: Secondary | ICD-10-CM

## 2017-05-08 NOTE — Therapy (Signed)
[place], [place], [address] Phone: [phone]   Fax:  [phone]  Occupational Therapy Treatment  Patient Details  Name: [name] MRN: [id number] Date of Birth: [birth year] Referring Provider: Dr. Marcene Duos   Encounter Date: 05/08/2017  OT End of Session - 05/08/17 1546    Visit Number  19    Number of Visits  36    Date for OT Re-Evaluation  05/29/17    Authorization Type  [employer] medicare    Authorization Time Period  before 26th visit    Authorization - Visit Number  19    Authorization - Number of Visits  26    OT Start Time  1305    OT Stop Time  1344    OT Time Calculation (min)  39 min    Activity Tolerance  Patient tolerated treatment well    Behavior During Therapy  [facility] for tasks assessed/performed       Past Medical History:  Diagnosis Date  . Arthritis    Osteoarthritis  . Colon polyps   . GERD (gastroesophageal reflux disease)   . Hiatal hernia   . Hypertension   . MS (multiple sclerosis) ([place]) 12/09/2013  . Multiple sclerosis ([place])   . Seizures ([place]) 01/04/2005   1 and Only    Past Surgical History:  Procedure Laterality Date  . COLONOSCOPY    . COLONOSCOPY N/A 09/16/2012   Performed by Rogene Houston, MD at [facility]  . DILATION AND CURETTAGE OF UTERUS    . OPEN REDUCTION INTERNAL FIXATION (ORIF) LEFT PROXIMAL HUMERUS FRACTURE Left 02/16/2017   Performed by Meredith Pel, MD at [facility] OR    There were no vitals filed for this visit.  Subjective Assessment - 05/08/17 1307    Subjective   S: I've got three more visits left.     Currently in Pain?  No/denies         [facility] OT Assessment - 05/08/17 1307      Assessment   Diagnosis  Left shoulder humerus fracture fixation      Precautions   Precautions  Shoulder    Type of Shoulder Precautions  10/12: Ok to begin strengthening of LUE. Fracture has healed. Continue therapy 1-2x a week for 6 weeks.                 OT Treatments/Exercises (OP) - 05/08/17 1308      Exercises   Exercises  Elbow;Shoulder      Shoulder Exercises: Supine   Protraction  PROM;5 reps;Strengthening;15 reps    Protraction Weight (lbs)  1    Horizontal ABduction  PROM;5 reps;Strengthening;15 reps    Horizontal ABduction Weight (lbs)  1    External Rotation  PROM;5 reps;Strengthening;15 reps    External Rotation Weight (lbs)  1    Internal Rotation  PROM;5 reps;Strengthening;15 reps    Internal Rotation Weight (lbs)  1    Flexion  PROM;5 reps;Strengthening;15 reps    Shoulder Flexion Weight (lbs)  1    ABduction  PROM;5 reps;Strengthening;15 reps    Shoulder ABduction Weight (lbs)  1      Shoulder Exercises: Seated   Protraction  Strengthening;20 reps    Protraction Weight (lbs)  1    Horizontal ABduction  Strengthening;15 reps    Horizontal ABduction Weight (lbs)  1    External Rotation  Strengthening;20 reps    External Rotation Weight (lbs)  1  Internal Rotation  Strengthening;20 reps    Internal Rotation Weight (lbs)  1    Flexion  Strengthening;10 reps    Flexion Weight (lbs)  1    Abduction  Strengthening;15 reps    ABduction Weight (lbs)  1      Shoulder Exercises: Standing   Extension  Theraband;10 reps    Theraband Level (Shoulder Extension)  Level 2 (Red)    Row  Theraband;10 reps    Theraband Level (Shoulder Row)  Level 2 (Red)    Retraction  Theraband;10 reps    Theraband Level (Shoulder Retraction)  Level 2 (Red)      Shoulder Exercises: ROM/Strengthening   X to V Arms  15 times each at 75% range     Proximal Shoulder Strengthening, Supine  15X with 1# no rest breaks    Proximal Shoulder Strengthening, Seated  10X, 1# no rest breaks    Ball on Wall  1' flexion      Elbow Exercises   Bar Weights/Barbell (Elbow Flexion)  2 lbs    Elbow Extension  Strengthening;20 reps hammer curl and bicep curl    Bar Weights/Barbell (Elbow Extension)  2 lbs    Forearm Supination   Strengthening;15 reps 2#    Forearm Pronation  Strengthening;15 reps 2#    Other elbow exercises  elbow flexion-hammer curl and bicep curl, 20X      Manual Therapy   Manual Therapy  Myofascial release    Manual therapy comments  Manual therapy completed prior to exercises    Myofascial Release  Myofascial release and manual stretching completed to left upper ar,. trapezius, and scapularis region to decrease fascial restrictions and increase joint mobility in a pain free zone.                OT Short Term Goals - 04/29/17 1344      OT SHORT TERM GOAL #1   Title  Patient will be educated and independent with HEP to faciliate progress in therapy and return to use LUE for daily tasks.     Time  6    Period  Weeks    Status  Achieved      OT SHORT TERM GOAL #2   Title  Patient will decrease edema in LUE by 1 cm or more as needed to increase functional mobility needed to complete light housekeeping tasks.     Time  6    Period  Weeks      OT SHORT TERM GOAL #3   Title  Patient will decrease fascial restrictions in LUE to mod amount to increase functional mobility needed to complete dressing activities.    Time  6    Period  Weeks      OT SHORT TERM GOAL #4   Title  Patient will increase LUE strength (shoulder and elbow) to 4-/5 to increase ability to complete household tasks below shoulder level.     Time  6    Period  Weeks    Status  Partially Met      OT SHORT TERM GOAL #5   Title  Patient will report a pain score of 5/10 or less when using LUE for daily tasks.     Time  6    Status  Achieved      OT SHORT TERM GOAL #6   Title  Patient will increase P/ROM of LUE (shoulder and elbow) to [facility] to increase ability to complete dressing tasks.     Time  6    Period  Weeks    Status  Achieved        OT Long Term Goals - 04/29/17 1346      OT LONG TERM GOAL #1   Title  Patient will return to highest level of independence with all daily tasks while using LUE.     Time   12    Period  Weeks    Status  On-going      OT LONG TERM GOAL #2   Title  Patient will decrease edema in LUE by 2-3 cm in order to increase functional mobility needed for overhead reaching tasks.     Time  12    Period  Weeks    Status  Achieved      OT LONG TERM GOAL #3   Title  Patient will decrease fascial restrictions in LUE to min amount or less to increase functional mobility needed to complete dressing tasks such as taking bra on and off.    Time  12    Period  Weeks    Status  Achieved      OT LONG TERM GOAL #4   Title  Patient will increase LUE strength (shoulder and elbow) to 4/5 to increase ability to return to normal household and daily lifting tasks.     Time  12    Period  Weeks    Status  On-going      OT LONG TERM GOAL #5   Title  Patient will increase A/ROM of LUE (shoulder and elbow) to [facility] to increase ability to complete tasks overhead easier.     Time  12    Period  Weeks    Status  Partially Met      OT LONG TERM GOAL #6   Title  Patient will report a pain level of 3/10 when completing daily tasks.     Time  12    Period  Weeks    Status  Achieved            Plan - 05/08/17 1547    Clinical Impression Statement  A: Continued with strengthening this session, adding overhead lacing and ball on wall for improved endurance and scapular stability. Verbal cuing for form and technique.     Plan  P: Add 1# weight to overhead lacing    Consulted and Agree with Plan of Care  Patient       Patient will benefit from skilled therapeutic intervention in order to improve the following deficits and impairments:  Decreased scar mobility, Increased edema, Decreased range of motion, Increased fascial restricitons, Impaired UE functional use, Pain, Decreased strength, Decreased knowledge of use of DME  Visit Diagnosis: Stiffness of left shoulder, not elsewhere classified  Other symptoms and signs involving the musculoskeletal system  Acute pain of left  shoulder    Problem List Patient Active Problem List   Diagnosis Date Noted  . Proximal humerus fracture 02/16/2017  . Insomnia, controlled 01/06/2017  . MS (multiple sclerosis) ([place]) 12/09/2013   Guadelupe Sabin, OTR/L  [phone] 05/08/2017, 4:36 PM  [place] [address] [place], [place], [address] Phone: [phone]   Fax:  [phone]  Name: [name] MRN: [id number] Date of Birth: 15-May-1956

## 2017-05-11 ENCOUNTER — Encounter (HOSPITAL_COMMUNITY): Payer: Medicare Other

## 2017-05-11 ENCOUNTER — Ambulatory Visit (HOSPITAL_COMMUNITY): Payer: Medicare Other | Admitting: Specialist

## 2017-05-11 ENCOUNTER — Telehealth (HOSPITAL_COMMUNITY): Payer: Self-pay | Admitting: Family Medicine

## 2017-05-11 NOTE — Telephone Encounter (Signed)
05/11/17  pt left a message to cancel today's appt but no reason was given

## 2017-05-13 ENCOUNTER — Ambulatory Visit (INDEPENDENT_AMBULATORY_CARE_PROVIDER_SITE_OTHER): Payer: Medicare Other | Admitting: Orthopedic Surgery

## 2017-05-13 ENCOUNTER — Encounter (HOSPITAL_COMMUNITY): Payer: Self-pay

## 2017-05-13 ENCOUNTER — Ambulatory Visit (HOSPITAL_COMMUNITY): Payer: Medicare Other

## 2017-05-13 DIAGNOSIS — M25622 Stiffness of left elbow, not elsewhere classified: Secondary | ICD-10-CM

## 2017-05-13 DIAGNOSIS — M25512 Pain in left shoulder: Secondary | ICD-10-CM

## 2017-05-13 DIAGNOSIS — R600 Localized edema: Secondary | ICD-10-CM

## 2017-05-13 DIAGNOSIS — M25522 Pain in left elbow: Secondary | ICD-10-CM

## 2017-05-13 DIAGNOSIS — R29898 Other symptoms and signs involving the musculoskeletal system: Secondary | ICD-10-CM

## 2017-05-13 DIAGNOSIS — M25612 Stiffness of left shoulder, not elsewhere classified: Secondary | ICD-10-CM

## 2017-05-13 NOTE — Therapy (Signed)
[name], [place], [address] Phone: [phone]   Fax:  [phone]  Occupational Therapy Treatment  Patient Details  Name: [name] MRN: [id number] Date of Birth: [birth year] Referring Provider: Dr. Marcene Duos   Encounter Date: 05/13/2017  OT End of Session - 05/13/17 1342    Visit Number  20    Number of Visits  36    Date for OT Re-Evaluation  05/29/17    Authorization Type  [employer] medicare    Authorization Time Period  before 26th visit    Authorization - Visit Number  20    Authorization - Number of Visits  26    OT Start Time  6160    OT Stop Time  1345    OT Time Calculation (min)  40 min    Activity Tolerance  Patient tolerated treatment well    Behavior During Therapy  [facility] for tasks assessed/performed       Past Medical History:  Diagnosis Date  . Arthritis    Osteoarthritis  . Colon polyps   . GERD (gastroesophageal reflux disease)   . Hiatal hernia   . Hypertension   . MS (multiple sclerosis) ([place]) 12/09/2013  . Multiple sclerosis ([place])   . Seizures ([place]) 01/04/2005   1 and Only    Past Surgical History:  Procedure Laterality Date  . COLONOSCOPY    . COLONOSCOPY N/A 09/16/2012   Procedure: COLONOSCOPY;  Surgeon: Rogene Houston, MD;  Location: [facility];  Service: Endoscopy;  Laterality: N/A;  1030-rescheduled to 9:30 Ann notified pt  . DILATION AND CURETTAGE OF UTERUS    . ORIF HUMERUS FRACTURE Left 02/16/2017   Procedure: OPEN REDUCTION INTERNAL FIXATION (ORIF) LEFT PROXIMAL HUMERUS FRACTURE;  Surgeon: Meredith Pel, MD;  Location: [facility];  Service: Orthopedics;  Laterality: Left;    There were no vitals filed for this visit.  Subjective Assessment - 05/13/17 1322    Subjective   S: I did the ball on the wall and I didn't drop the ball.    Currently in Pain?  No/denies         [facility] OT Assessment - 05/13/17 1323      Assessment   Diagnosis  Left shoulder humerus  fracture fixation      Precautions   Precautions  Shoulder    Type of Shoulder Precautions  10/12: Ok to begin strengthening of LUE. Fracture has healed. Continue therapy 1-2x a week for 6 weeks.                OT Treatments/Exercises (OP) - 05/13/17 1323      Exercises   Exercises  Elbow;Shoulder      Shoulder Exercises: Supine   Protraction  PROM;5 reps    Horizontal ABduction  PROM;5 reps    External Rotation  PROM;5 reps    Internal Rotation  PROM;5 reps    Flexion  PROM;5 reps    ABduction  PROM;5 reps      Shoulder Exercises: Seated   Protraction  Strengthening;12 reps    Protraction Weight (lbs)  2    Horizontal ABduction  Strengthening;12 reps    Horizontal ABduction Weight (lbs)  2    External Rotation  Strengthening;12 reps    External Rotation Weight (lbs)  2    Internal Rotation  Strengthening;12 reps    Internal Rotation Weight (lbs)  2    Flexion  Strengthening;12 reps    Flexion  Weight (lbs)  2    Abduction  Strengthening;12 reps    ABduction Weight (lbs)  2      Shoulder Exercises: ROM/Strengthening   UBE (Upper Arm Bike)  Level 2 2' reverse 2' forward    Over Head Lace  2' with 1# wrist weight    X to V Arms  15 times each at 75% range     Proximal Shoulder Strengthening, Seated  12X 1# no rest breaks    Ball on Wall  1' flexion 50" abduction green ball      Elbow Exercises   Bar Weights/Barbell (Elbow Flexion)  3 lbs    Elbow Extension  Strengthening;20 reps hammer curl and bicep curl    Bar Weights/Barbell (Elbow Extension)  3 lbs    Forearm Supination  Strengthening;20 reps 3#    Forearm Pronation  Strengthening;20 reps 3#    Other elbow exercises  elbow flexion-hammer curl and bicep curl, 20X      Manual Therapy   Manual Therapy  Myofascial release    Manual therapy comments  Manual therapy completed prior to exercises    Myofascial Release  Myofascial release and manual stretching completed to left upper ar,. trapezius, and scapularis  region to decrease fascial restrictions and increase joint mobility in a pain free zone.                OT Short Term Goals - 05/13/17 1416      OT SHORT TERM GOAL #1   Title  Patient will be educated and independent with HEP to faciliate progress in therapy and return to use LUE for daily tasks.     Time  6    Period  Weeks      OT SHORT TERM GOAL #2   Title  Patient will decrease edema in LUE by 1 cm or more as needed to increase functional mobility needed to complete light housekeeping tasks.     Time  6    Period  Weeks      OT SHORT TERM GOAL #3   Title  Patient will decrease fascial restrictions in LUE to mod amount to increase functional mobility needed to complete dressing activities.    Time  6    Period  Weeks      OT SHORT TERM GOAL #4   Title  Patient will increase LUE strength (shoulder and elbow) to 4-/5 to increase ability to complete household tasks below shoulder level.     Time  6    Period  Weeks    Status  Partially Met      OT SHORT TERM GOAL #5   Title  Patient will report a pain score of 5/10 or less when using LUE for daily tasks.     Time  6      OT SHORT TERM GOAL #6   Title  Patient will increase P/ROM of LUE (shoulder and elbow) to [facility] to increase ability to complete dressing tasks.     Time  6    Period  Weeks        OT Long Term Goals - 05/13/17 1416      OT LONG TERM GOAL #1   Title  Patient will return to highest level of independence with all daily tasks while using LUE.     Time  12    Period  Weeks    Status  On-going      OT LONG TERM GOAL #2  Title  Patient will decrease edema in LUE by 2-3 cm in order to increase functional mobility needed for overhead reaching tasks.     Time  12    Period  Weeks      OT LONG TERM GOAL #3   Title  Patient will decrease fascial restrictions in LUE to min amount or less to increase functional mobility needed to complete dressing tasks such as taking bra on and off.    Time  12     Period  Weeks      OT LONG TERM GOAL #4   Title  Patient will increase LUE strength (shoulder and elbow) to 4/5 to increase ability to return to normal household and daily lifting tasks.     Time  12    Period  Weeks    Status  On-going      OT LONG TERM GOAL #5   Title  Patient will increase A/ROM of LUE (shoulder and elbow) to [facility] to increase ability to complete tasks overhead easier.     Time  12    Period  Weeks    Status  Partially Met      OT LONG TERM GOAL #6   Title  Patient will report a pain level of 3/10 when completing daily tasks.     Time  12    Period  Weeks            Plan - 05/13/17 1412    Clinical Impression Statement  A: Increased to 2# for shoulder strengthening and 3# for elbow strengthening. Pt was able to complete exercises with min rest breaks. VC for form and technique.    Plan  P: Reassess and discharge. Follow up on MD appointment.        Patient will benefit from skilled therapeutic intervention in order to improve the following deficits and impairments:  Decreased scar mobility, Increased edema, Decreased range of motion, Increased fascial restricitons, Impaired UE functional use, Pain, Decreased strength, Decreased knowledge of use of DME  Visit Diagnosis: Stiffness of left shoulder, not elsewhere classified  Other symptoms and signs involving the musculoskeletal system  Acute pain of left shoulder  Stiffness of left elbow, not elsewhere classified  Pain in left elbow  Localized edema    Problem List Patient Active Problem List   Diagnosis Date Noted  . Proximal humerus fracture 02/16/2017  . Insomnia, controlled 01/06/2017  . MS (multiple sclerosis) ([place]) 12/09/2013   Ailene Ravel, OTR/L,CBIS  [phone]  05/13/2017, 2:17 PM  [place] [address] [place], [place], [address] Phone: [phone]   Fax:  [phone]  Name: [name] MRN: [id number] Date of Birth:  [birth year]

## 2017-05-15 ENCOUNTER — Encounter (HOSPITAL_COMMUNITY): Payer: Medicare Other

## 2017-05-18 ENCOUNTER — Encounter (HOSPITAL_COMMUNITY): Payer: Medicare Other

## 2017-05-20 ENCOUNTER — Ambulatory Visit (INDEPENDENT_AMBULATORY_CARE_PROVIDER_SITE_OTHER): Payer: Medicare Other | Admitting: Orthopedic Surgery

## 2017-05-20 ENCOUNTER — Encounter (INDEPENDENT_AMBULATORY_CARE_PROVIDER_SITE_OTHER): Payer: Self-pay | Admitting: Orthopedic Surgery

## 2017-05-20 ENCOUNTER — Ambulatory Visit (INDEPENDENT_AMBULATORY_CARE_PROVIDER_SITE_OTHER): Payer: Medicare Other

## 2017-05-20 ENCOUNTER — Encounter (HOSPITAL_COMMUNITY): Payer: Medicare Other

## 2017-05-20 DIAGNOSIS — S42202D Unspecified fracture of upper end of left humerus, subsequent encounter for fracture with routine healing: Secondary | ICD-10-CM | POA: Diagnosis not present

## 2017-05-22 ENCOUNTER — Encounter (HOSPITAL_COMMUNITY): Payer: Self-pay | Admitting: Occupational Therapy

## 2017-05-22 ENCOUNTER — Other Ambulatory Visit: Payer: Self-pay

## 2017-05-22 ENCOUNTER — Ambulatory Visit (HOSPITAL_COMMUNITY): Payer: Medicare Other | Admitting: Occupational Therapy

## 2017-05-22 DIAGNOSIS — R29898 Other symptoms and signs involving the musculoskeletal system: Secondary | ICD-10-CM

## 2017-05-22 DIAGNOSIS — M25512 Pain in left shoulder: Secondary | ICD-10-CM

## 2017-05-22 DIAGNOSIS — M25612 Stiffness of left shoulder, not elsewhere classified: Secondary | ICD-10-CM | POA: Diagnosis not present

## 2017-05-22 NOTE — Patient Instructions (Signed)
Theraband strengthening: Complete 10-15X, 1-2X/day  1) Shoulder protraction  Anchor band in doorway, stand with back to door. Push your hand forward as much as you can to bringing your shoulder blades forward on your rib cage.     2) Shoulder flexion  While standing with back to the door, holding Theraband at hand level, raise arm in front of you.  Keep elbow straight through entire movement.      3) Shoulder horizontal abduction  Standing with a theraband anchored at chest height, begin with arm straight and some tension in the band. Move your arm out to your side (keeping straight the whole time). Bring the affected arm back to midline.     4) Shoulder Internal Rotation  While holding an elastic band at your side with your elbow bent, start with your hand away from your stomach, then pull the band towards your stomach. Keep your elbow near your side the entire time.     5) Shoulder External Rotation  While holding an elastic band at your side with your elbow bent, start with your hand near your stomach and then pull the band away. Keep your elbow at your side the entire time.     6) Shoulder abduction  While holding an elastic band at your side, draw up your arm to the side keeping your elbow straight.   

## 2017-05-22 NOTE — Therapy (Signed)
[place], [place], [address] Phone: [phone]   Fax:  [phone]  Occupational Therapy Reassessment and Treatment  Patient Details  Name: [name] MRN: [id number] Date of Birth: [birth year] Referring Provider: Dr. Marcene Duos   Encounter Date: 05/22/2017  OT End of Session - 05/22/17 1336    Visit Number  21    Number of Visits  36    Date for OT Re-Evaluation  05/29/17    Authorization Type  [employer] medicare    Authorization Time Period  before 26th visit    Authorization - Visit Number  21    Authorization - Number of Visits  26    OT Start Time  1303    OT Stop Time  1334    OT Time Calculation (min)  31 min    Activity Tolerance  Patient tolerated treatment well    Behavior During Therapy  [facility] for tasks assessed/performed       Past Medical History:  Diagnosis Date  . Arthritis    Osteoarthritis  . Colon polyps   . GERD (gastroesophageal reflux disease)   . Hiatal hernia   . Hypertension   . MS (multiple sclerosis) ([place]) 12/09/2013  . Multiple sclerosis ([place])   . Seizures ([place]) 01/04/2005   1 and Only    Past Surgical History:  Procedure Laterality Date  . COLONOSCOPY    . COLONOSCOPY N/A 09/16/2012   Procedure: COLONOSCOPY;  Surgeon: Rogene Houston, MD;  [facility];  Service: Endoscopy;  Laterality: N/A;  1030-rescheduled to 9:30 Ann notified pt  . DILATION AND CURETTAGE OF UTERUS    . ORIF HUMERUS FRACTURE Left 02/16/2017   Procedure: OPEN REDUCTION INTERNAL FIXATION (ORIF) LEFT PROXIMAL HUMERUS FRACTURE;  Surgeon: Meredith Pel, MD;  Location: [place];  Service: Orthopedics;  Laterality: Left;    There were no vitals filed for this visit.  Subjective Assessment - 05/22/17 1306    Subjective   S: The doctor released me too.     Currently in Pain?  No/denies         [facility] OT Assessment - 05/22/17 1306      Assessment   Diagnosis  Left shoulder humerus fracture  fixation      Precautions   Precautions  Shoulder    Type of Shoulder Precautions  10/12: Ok to begin strengthening of LUE. Fracture has healed. Continue therapy 1-2x a week for 6 weeks.       AROM   Overall AROM Comments  Assessed seated. IR/er adducted    AROM Assessment Site  Shoulder    Right/Left Shoulder  Left    Left Shoulder Flexion  144 Degrees 135 previous    Left Shoulder ABduction  149 Degrees 130 previous    Left Shoulder Internal Rotation  90 Degrees same as previous    Left Shoulder External Rotation  68 Degrees same as previous      PROM   Overall PROM Comments  Assessed supine. IR/er adducted,    PROM Assessment Site  Shoulder    Right/Left Shoulder  Left    Left Shoulder Flexion  155 Degrees 154 previous    Left Shoulder ABduction  180 Degrees same as previous    Left Shoulder Internal Rotation  90 Degrees same as previous    Left Shoulder External Rotation  70 Degrees 60 previous      Strength   Overall Strength Comments  Assessed seated;  IR/er adducted. Strength not assessed this session.     Strength Assessment Site  Shoulder    Left Shoulder Flexion  4/5 3+/5 previous    Left Shoulder ABduction  4-/5 same as previous    Left Shoulder Internal Rotation  4/5 same as previous    Left Shoulder External Rotation  4-/5 3/5 previous               OT Treatments/Exercises (OP) - 05/22/17 1335      Exercises   Exercises  Shoulder      Shoulder Exercises: Supine   Protraction  PROM;5 reps    Horizontal ABduction  PROM;5 reps    External Rotation  PROM;5 reps    Internal Rotation  PROM;5 reps    Flexion  PROM;5 reps    ABduction  PROM;5 reps      Shoulder Exercises: Seated   Protraction  Theraband;10 reps    Theraband Level (Shoulder Protraction)  Level 2 (Red)    Horizontal ABduction  Theraband;10 reps    Theraband Level (Shoulder Horizontal ABduction)  Level 2 (Red)    External Rotation  Theraband;10 reps    Theraband Level (Shoulder External  Rotation)  Level 2 (Red)    Internal Rotation  Theraband;10 reps    Theraband Level (Shoulder Internal Rotation)  Level 2 (Red)    Flexion  Theraband;10 reps    Theraband Level (Shoulder Flexion)  Level 2 (Red)    Abduction  Theraband;10 reps    Theraband Level (Shoulder ABduction)  Level 2 (Red)             OT Education - 05/22/17 1336    Education provided  Yes    Education Details  red theraband strengthening exercises    Person(s) Educated  Patient    Methods  Explanation;Demonstration;Handout    Comprehension  Verbalized understanding;Returned demonstration       OT Short Term Goals - 05/22/17 1336      OT SHORT TERM GOAL #1   Title  Patient will be educated and independent with HEP to faciliate progress in therapy and return to use LUE for daily tasks.     Time  6    Period  Weeks      OT SHORT TERM GOAL #2   Title  Patient will decrease edema in LUE by 1 cm or more as needed to increase functional mobility needed to complete light housekeeping tasks.     Time  6    Period  Weeks      OT SHORT TERM GOAL #3   Title  Patient will decrease fascial restrictions in LUE to mod amount to increase functional mobility needed to complete dressing activities.    Time  6    Period  Weeks      OT SHORT TERM GOAL #4   Title  Patient will increase LUE strength (shoulder and elbow) to 4-/5 to increase ability to complete household tasks below shoulder level.     Time  6    Period  Weeks    Status  Achieved      OT SHORT TERM GOAL #5   Title  Patient will report a pain score of 5/10 or less when using LUE for daily tasks.     Time  6      OT SHORT TERM GOAL #6   Title  Patient will increase P/ROM of LUE (shoulder and elbow) to [facility] to increase ability to complete dressing tasks.  Time  6    Period  Weeks        OT Long Term Goals - 06/09/2017 1337      OT LONG TERM GOAL #1   Title  Patient will return to highest level of independence with all daily tasks while  using LUE.     Time  12    Period  Weeks    Status  Achieved      OT LONG TERM GOAL #2   Title  Patient will decrease edema in LUE by 2-3 cm in order to increase functional mobility needed for overhead reaching tasks.     Time  12    Period  Weeks      OT LONG TERM GOAL #3   Title  Patient will decrease fascial restrictions in LUE to min amount or less to increase functional mobility needed to complete dressing tasks such as taking bra on and off.    Time  12    Period  Weeks      OT LONG TERM GOAL #4   Title  Patient will increase LUE strength (shoulder and elbow) to 4/5 to increase ability to return to normal household and daily lifting tasks.     Time  12    Period  Weeks    Status  Achieved      OT LONG TERM GOAL #5   Title  Patient will increase A/ROM of LUE (shoulder and elbow) to [facility] to increase ability to complete tasks overhead easier.     Time  12    Period  Weeks    Status  Achieved      OT LONG TERM GOAL #6   Title  Patient will report a pain level of 3/10 when completing daily tasks.     Time  12    Period  Weeks            Plan - 2017/06/09 1336    Clinical Impression Statement  A: Reassessment completed today, pt has met all goals making great progress with ROM, Strength, and functional use of LUE during daily tasks. Pt reports she is using LUE as non-dominant during B/IADLs and does not have pain. Pt reports MD is pleased with her progress and released her. Pt is agreeable to discharge with HEP.     Plan  P: Discharge pt    Consulted and Agree with Plan of Care  Patient       Patient will benefit from skilled therapeutic intervention in order to improve the following deficits and impairments:     Visit Diagnosis: Stiffness of left shoulder, not elsewhere classified  Other symptoms and signs involving the musculoskeletal system  Acute pain of left shoulder  G-Codes - 06/09/2017 1338    Functional Assessment Tool Used (Outpatient only)  FOTO Score:  83/100 (17% impairment    Functional Limitation  Self care    Self Care Goal Status (I0973)  At least 20 percent but less than 40 percent impaired, limited or restricted    Self Care Discharge Status [id number])  At least 1 percent but less than 20 percent impaired, limited or restricted       Problem List Patient Active Problem List   Diagnosis Date Noted  . Proximal humerus fracture 02/16/2017  . Insomnia, controlled 01/06/2017  . MS (multiple sclerosis) ([place]) 12/09/2013   Guadelupe Sabin, OTR/L  [phone] 2017-06-09, 1:39 PM  [place], [place],  [address] Phone: [phone]   Fax:  [phone]  Name: [name] MRN: [id number] Date of Birth: [birth year]    OCCUPATIONAL THERAPY DISCHARGE SUMMARY  Visits from Start of Care: 21  Current functional level related to goals / functional outcomes: See above. Pt is independent in all B/IADL completion, reports no pain with daily tasks.    Remaining deficits: Slight strength deficit   Education / Equipment: Red theraband strengthening Plan: Patient agrees to discharge.  Patient goals were met. Patient is being discharged due to meeting the stated rehab goals.  ?????

## 2017-05-24 NOTE — Progress Notes (Signed)
   Post-Op Visit Note   Patient: [name]           Date of Birth: [birth year]           MRN: [id number] Visit Date: 05/20/2017 PCP: Sharilyn Sites, MD   Assessment & Plan:  Chief Complaint:  Chief Complaint  Patient presents with  . Left Shoulder - Follow-up   Visit Diagnoses:  1. Closed fracture of proximal end of left humerus with routine healing, unspecified fracture morphology, subsequent encounter     Plan: [name] is a patient who is now 3 months out left humerus open reduction internal fixation.  She is doing well.  He is going to physical therapy.  She is improved a lot with her range of motion.  She is still smoking.  On examination she has forward flexion and abduction both about 90 degrees and good rotator cuff strength.  Rotator cuff strength good.  Plan is home exercise program with follow-up as needed.  She is happy with her surgical results.  I think the complexity of the fracture and the healing despite her smoking is a good outcome for her..  I will see her back as needed  Follow-Up Instructions: Return if symptoms worsen or fail to improve.   Orders:  Orders Placed This Encounter  Procedures  . XR Humerus Left   No orders of the defined types were placed in this encounter.   Imaging: No results found.  PMFS History: Patient Active Problem List   Diagnosis Date Noted  . Proximal humerus fracture 02/16/2017  . Insomnia, controlled 01/06/2017  . MS (multiple sclerosis) ([place]) 12/09/2013   Past Medical History:  Diagnosis Date  . Arthritis    Osteoarthritis  . Colon polyps   . GERD (gastroesophageal reflux disease)   . Hiatal hernia   . Hypertension   . MS (multiple sclerosis) ([place]) 12/09/2013  . Multiple sclerosis ([place])   . Seizures ([place]) 01/04/2005   1 and Only    Family History  Problem Relation Age of Onset  . Stomach cancer Father   . Sick sinus syndrome Father   . Colon cancer Neg Hx     Past Surgical History:  Procedure Laterality Date  .  COLONOSCOPY    . COLONOSCOPY N/A 09/16/2012   Procedure: COLONOSCOPY;  Surgeon: Rogene Houston, MD;  [facility];  Service: Endoscopy;  Laterality: N/A;  1030-rescheduled to 9:30 Ann notified pt  . DILATION AND CURETTAGE OF UTERUS    . ORIF HUMERUS FRACTURE Left 02/16/2017   Procedure: OPEN REDUCTION INTERNAL FIXATION (ORIF) LEFT PROXIMAL HUMERUS FRACTURE;  Surgeon: Meredith Pel, MD;  Location: [place];  Service: Orthopedics;  Laterality: Left;   Social History   Occupational History    Employer: DISABLED  Tobacco Use  . Smoking status: Current Every Day Smoker    Packs/day: 0.75    Years: 30.00    Pack years: 22.50    Types: Cigarettes  . Smokeless tobacco: Never Used  Substance and Sexual Activity  . Alcohol use: Yes    Alcohol/week: 4.8 oz    Types: 8 Cans of beer per week  . Drug use: No  . Sexual activity: Not on file

## 2017-08-22 ENCOUNTER — Other Ambulatory Visit: Payer: Self-pay | Admitting: Neurology

## 2017-08-22 DIAGNOSIS — G4700 Insomnia, unspecified: Secondary | ICD-10-CM

## 2017-08-22 DIAGNOSIS — K2100 Gastro-esophageal reflux disease with esophagitis, without bleeding: Secondary | ICD-10-CM

## 2017-08-22 DIAGNOSIS — G35 Multiple sclerosis: Secondary | ICD-10-CM

## 2017-09-08 ENCOUNTER — Encounter: Payer: Self-pay | Admitting: Neurology

## 2017-09-08 ENCOUNTER — Telehealth: Payer: Self-pay | Admitting: Neurology

## 2017-09-08 ENCOUNTER — Ambulatory Visit: Payer: Medicare Other | Admitting: Neurology

## 2017-09-08 VITALS — BP 135/90 | HR 81 | Ht 66.0 in | Wt 144.0 lb

## 2017-09-08 DIAGNOSIS — G4700 Insomnia, unspecified: Secondary | ICD-10-CM | POA: Diagnosis not present

## 2017-09-08 DIAGNOSIS — G35 Multiple sclerosis: Secondary | ICD-10-CM | POA: Diagnosis not present

## 2017-09-08 DIAGNOSIS — K2100 Gastro-esophageal reflux disease with esophagitis, without bleeding: Secondary | ICD-10-CM

## 2017-09-08 DIAGNOSIS — S42202D Unspecified fracture of upper end of left humerus, subsequent encounter for fracture with routine healing: Secondary | ICD-10-CM

## 2017-09-08 MED ORDER — GLATIRAMER ACETATE 40 MG/ML ~~LOC~~ SOSY: 40.0000 mg | PREFILLED_SYRINGE | SUBCUTANEOUS | 5 refills | Status: DC

## 2017-09-08 NOTE — Telephone Encounter (Signed)
[employer] medicare auth: NPR faxed order to [facility] because the patient would like the open MRI.

## 2017-09-08 NOTE — Addendum Note (Signed)
Addended by: Larey Seat on: 09/08/2017 11:34 AM   Modules accepted: Orders

## 2017-09-08 NOTE — Progress Notes (Signed)
[facility]  Provider:  Larey Seat, M D  Referring Provider: Sharilyn Sites, MD Primary Care Physician:  Sharilyn Sites, MD  Chief Complaint  Patient presents with  . Follow-up    pt alone, rm 11 pt broke her arm back in august and had surgery.     HPI:  [name] is a 62 y.o. female , who was originally seen here as a referral from Dr. Andris Flurry at the offices of  Dr. Hilma Favors , MD for a transfer of care for multiple sclerosis.  [name] reports that she received the diagnosis of multiple sclerosis at age 45. She was diagnosed by Dr. Arsenio Katz, after years of having symptoms that included skin dysesthesias , numbness and right eye vision loss or blurring of vision- Soon afterwards she was seen by Dr. Dellis Filbert ,a multiple sclerosis specialist and followed him from the [facility] office to the [place] office. She is now looking for followup care , also within the network of her other physicians.  Original diagnosis was established by clinical history, abnormal brain MRI and a spinal tap positive for oligoclonal bands. A copy of one of her last MRI is is available to me. Her brain MRI with and without contrast was compared to a study from 2012 and documented only  minimal  Disease progression. The patient had mild ventricular enlargement unchanged for the last 3 years. Multiple areas of increased white matter signal in the periventricular space, significant involvement of the corpus callosum and classic Dawson's fingers. Involvement of white matter demyelination in the temporal area. Dr. Starleen Blue interpreted the study as  moderate to severe disease burden , referenced  to her long-standing multiple sclerosis. The patient's most recent blood test results were also attached ( referral papers)- she has a normal white blood cell count she's not anemic, there is  normal kidney function ,normal liver function and  she is not diabetic. She's currently controlled on Copaxone  with 40 MG 3  TIMES A WEEK THE PATIENT SWITCHED LAST SUMMER FROM daily to the 3 Times Weekly Formulation.  She has Relapsing Remitting Multiple Sclerosis , she has noticed neither  clinical changes nor side effects since switching in the formulation.    06-20-14  [name] has been stable neurologically and overall since our last visit. She has changed to the 3 times weekly form of Copaxone without any problems. She already received her pneumonia shot and her flu shot for this year. She resides in [place] and the community has been suffering under a viral infection wave this winter with the respiratory with respiratory symptoms as well as myalgia. She has so far done very well. She has no recent blood test to review. She moved to a one level apartment , first floor and is happy with the new neighborhood. She lives alone with a cat.   01-03-2015. [name] is here for her regular routine and refill visit. She had no evidence of any relapses she remains hypertensive height per recent flexible sigmoidoscopy but was normal Babinski responses and she has some proximal muscle rigidity but no cogwheeling associated with it all this speaks for a mild progressive form of MS for progression has been very slow and she has been responding well to the medications. This is her 62 year anniversary of the diagnosis of MS.  01-02-2016, [name] has not felt any impairment, no relapse or remitting symptoms of MS. We will repeat a brain MRI. Her original diagnosis was established by  clinical history abnormal brain MRI and oligoclonal bands in her cerebral spinal fluid. We discussed today when she can have her MRI and she would like it in November or even December of this year at [place] location. She would like an open MRI.  Interval history from 01/06/2017 for this established 62 year old Caucasian patient [name], who carries a diagnosis of multiple sclerosis. Heat and humidity affect her. The patient is  currently treated with Copaxone 40 mg 3 times a week and has had no evidence of a relapse since. She has been stable for the last 4 years. Her diagnosis was established on 7-15- 2006. She drives, lives independent and alone and is able to perform all activities of daily living, handles her own finances.I like for her to have a new MRI - she would like to have it at [place].   09-08-2017, R for this established patient with MS. Now due for MRI - brain and c spine. She insists on open MRI . She fell  while brooming off cobwebs from the ceiling and broke her left humerus. Went to ED 02-06-2017 , surgery 02-16-2017. She now has a metal plate.     Review of Systems: Out of a complete 14 system review, the patient complains of only the following symptoms, and all other reviewed systems are negative.  Patient feels hot and weeker in humidity. Still smoking - 1 ppd. Broke left arm at shoulder in 01-2017.     Social History   Socioeconomic History  . Marital status: Single    Spouse name: Not on file  . Number of children: 0  . Years of education: College  . Highest education level: Not on file  Social Needs  . Financial resource strain: Not on file  . Food insecurity - worry: Not on file  . Food insecurity - inability: Not on file  . Transportation needs - medical: Not on file  . Transportation needs - non-medical: Not on file  Occupational History    Employer: DISABLED  Tobacco Use  . Smoking status: Current Every Day Smoker    Packs/day: 0.75    Years: 30.00    Pack years: 22.50    Types: Cigarettes  . Smokeless tobacco: Never Used  Substance and Sexual Activity  . Alcohol use: Yes    Alcohol/week: 4.8 oz    Types: 8 Cans of beer per week  . Drug use: No  . Sexual activity: Not on file  Other Topics Concern  . Not on file  Social History Narrative   Patient is single and lives alone.   Patient has a college education   Patient is right-handed.   Patient is retired.    Patient drinks three cups of coffee daily.    Family History  Problem Relation Age of Onset  . Stomach cancer Father   . Sick sinus syndrome Father   . Colon cancer Neg Hx     Past Medical History:  Diagnosis Date  . Arthritis    Osteoarthritis  . Colon polyps   . GERD (gastroesophageal reflux disease)   . Hiatal hernia   . Hypertension   . MS (multiple sclerosis) ([place]) 12/09/2013  . Multiple sclerosis ([place])   . Seizures ([place]) 01/04/2005   1 and Only    Past Surgical History:  Procedure Laterality Date  . COLONOSCOPY    . COLONOSCOPY N/A 09/16/2012   Procedure: COLONOSCOPY;  Surgeon: Rogene Houston, MD;  [facility];  Service: Endoscopy;  Laterality: N/A;  1030-rescheduled to 9:30 Ann notified pt  . DILATION AND CURETTAGE OF UTERUS    . ORIF HUMERUS FRACTURE Left 02/16/2017   Procedure: OPEN REDUCTION INTERNAL FIXATION (ORIF) LEFT PROXIMAL HUMERUS FRACTURE;  Surgeon: Meredith Pel, MD;  Location: [place];  Service: Orthopedics;  Laterality: Left;    Current Outpatient Medications  Medication Sig Dispense Refill  . Artificial Tear Solution (SOOTHE XP OP) Place 1 drop into both eyes 3 (three) times daily as needed (for dry/irritated eyes).     . cholecalciferol (VITAMIN D) 400 units TABS tablet Take 400 Units by mouth daily.    . Cyanocobalamin (VITAMIN B-12) 500 MCG SUBL Place under the tongue.    . diazepam (VALIUM) 5 MG tablet Take 2.5 mg by mouth at bedtime as needed (sleep).     Marland Kitchen esomeprazole (NEXIUM) 40 MG capsule TAKE 1 CAPSULE BY MOUTH DAILY AT 12 NOON 90 capsule 1  . Glatiramer Acetate (COPAXONE) 40 MG/ML SOSY Inject 40 mg into the skin as directed. 3 times a week (Patient taking differently: Inject 40 mg into the skin 3 (three) times a week. Monday, Thursday, & Sunday in the evening.) 36 Syringe 5  . ibuprofen (ADVIL,MOTRIN) 200 MG tablet Take 600 mg by mouth every 8 (eight) hours as needed for moderate pain (for pain.).    Marland Kitchen losartan (COZAAR) 50 MG  tablet Take 75 mg by mouth daily.    . Probiotic Product (PROBIOTIC PO) Take 1 capsule by mouth daily.    . Vilazodone HCl (VIIBRYD) 10 MG TABS TAKE 1 TABLET BY MOUTH DAILY OR AS DIRECTED BY DOCTOR (Patient taking differently: Take 10 mg by mouth at bedtime. ) 90 tablet 3   No current facility-administered medications for this visit.     Allergies as of 09/08/2017 - Review Complete 09/08/2017  Allergen Reaction Noted  . Codeine Nausea And Vomiting 07/13/2012  . Dilaudid [hydromorphone hcl] Nausea And Vomiting 02/13/2017    Vitals: BP 135/90   Pulse 81   Ht 5\' 6"  (1.676 m)   Wt 144 lb (65.3 kg)   BMI 23.24 kg/m  Last Weight:  Wt Readings from Last 1 Encounters:  09/08/17 144 lb (65.3 kg)   Last Height:   Ht Readings from Last 1 Encounters:  09/08/17 5\' 6"  (1.676 m)    Physical exam:  General: The patient is awake, alert and appears  in acute distress from heat and humidity. The patient is well groomed. Head: Normocephalic, atraumatic. Neck is supple. Mallampati 2 , neck circumference: 13.7,  no TMJ pain or click , no delayed swalling  no neck tenderness.  Cardiovascular:  Regular rate and rhythm, without  murmurs or carotid bruit, and without distended neck veins. Respiratory: Lungs are clear to auscultation. Skin:  Without evidence of edema, or rash Trunk: BMI is normal.   Neurologic exam : The patient is awake and alert, oriented to place and time. Memory subjective described as intact.  There is a normal attention span & concentration ability. Speech is fluent without dysarthria, dysphonia or aphasia.  Mood and affect are appropriate.  Cranial nerves: No change in taste or smell. Pupils are equal and briskly reactive to light.  Extraocular movements  in vertical and horizontal planes intact and with end point nystagmus.  Visual fields by finger perimetry are intact.  Hearing to finger rub intact. Facial sensation intact to fine touch.  Facial motor strength is  symmetric,  tongue and uvula move midline. Motor exam:  Increased tone in the proximal extremities, slight tremor, no rigidity.  Both biceps with increased tone.  Symmetric muscle bulk and symmetric tone - but left arm strength reduced -  Left arm can be extended fully.  Sensory:  Fine touch, pinprick and vibration were tested in all extremities.  Coordination: Rapid alternating movements in the fingers/hands is tested and normal.  Finger-to-nose maneuver tested - no evidence of ataxia, dysmetria.  On the right: no pronator drift , no bilateral tremor seen today.  Gait and station: Patient walks without assistive device. Ataxia on tandem gait, heel stand and toe stand were deferred,  Romberg negative. Strength within normal limits. Stance is stable.   Deep tendon reflexes: in the  upper and lower extremities are symmetric ,very brisk- 3 plus , but not clonus.  Babinski maneuver response is downgoing.  Assessment:  After physical and neurologic examination, review of laboratory studies, imaging, neurophysiology testing and pre-existing records, assessment is MS.  1) Relapsing- remitting MS , on Copaxone.  Needs refill for 12 month- with continuous very mild symptoms of MS , no clinical relapses- more of chronic progression.       Medication refills- MRI ordered, open MRI requested. [facility] requested.   She did not consider OCREVUS. She would consider Tysabri- but not [place]. I gave her a pamphlet about Tysabri  medication.  She will need a JCV titer but than decided she will not need it until she has a next MRI results. Patient access to medical information network - needs to sign up.  The next brain  MRI and C spine  for the patient will be scheduled for May/  June 2019     Larey Seat, MD

## 2017-09-09 ENCOUNTER — Telehealth: Payer: Self-pay | Admitting: Neurology

## 2017-09-09 LAB — CBC WITH DIFFERENTIAL/PLATELET
BASOS: 1 %
Basophils Absolute: 0.1 10*3/uL (ref 0.0–0.2)
EOS (ABSOLUTE): 0.1 10*3/uL (ref 0.0–0.4)
EOS: 1 %
HEMATOCRIT: 45.5 % (ref 34.0–46.6)
HEMOGLOBIN: 14.8 g/dL (ref 11.1–15.9)
Immature Grans (Abs): 0 10*3/uL (ref 0.0–0.1)
Immature Granulocytes: 0 %
LYMPHS ABS: 1.8 10*3/uL (ref 0.7–3.1)
Lymphs: 23 %
MCH: 34.3 pg — AB (ref 26.6–33.0)
MCHC: 32.5 g/dL (ref 31.5–35.7)
MCV: 105 fL — ABNORMAL HIGH (ref 79–97)
MONOCYTES: 5 %
Monocytes Absolute: 0.4 10*3/uL (ref 0.1–0.9)
NEUTROS ABS: 5.5 10*3/uL (ref 1.4–7.0)
Neutrophils: 70 %
Platelets: 275 10*3/uL (ref 150–379)
RBC: 4.32 x10E6/uL (ref 3.77–5.28)
RDW: 14.6 % (ref 12.3–15.4)
WBC: 7.9 10*3/uL (ref 3.4–10.8)

## 2017-09-09 LAB — COMPREHENSIVE METABOLIC PANEL
ALBUMIN: 4.6 g/dL (ref 3.6–4.8)
ALT: 15 IU/L (ref 0–32)
AST: 21 IU/L (ref 0–40)
Albumin/Globulin Ratio: 1.8 (ref 1.2–2.2)
Alkaline Phosphatase: 92 IU/L (ref 39–117)
BUN / CREAT RATIO: 12 (ref 12–28)
BUN: 9 mg/dL (ref 8–27)
Bilirubin Total: 0.5 mg/dL (ref 0.0–1.2)
CO2: 22 mmol/L (ref 20–29)
CREATININE: 0.75 mg/dL (ref 0.57–1.00)
Calcium: 10.3 mg/dL (ref 8.7–10.3)
Chloride: 102 mmol/L (ref 96–106)
GFR, EST AFRICAN AMERICAN: 100 mL/min/{1.73_m2} (ref >59)
GFR, EST NON AFRICAN AMERICAN: 87 mL/min/{1.73_m2} (ref >59)
GLUCOSE: 99 mg/dL (ref 65–99)
Globulin, Total: 2.5 g/dL (ref 1.5–4.5)
Potassium: 4.9 mmol/L (ref 3.5–5.2)
Sodium: 142 mmol/L (ref 134–144)
TOTAL PROTEIN: 7.1 g/dL (ref 6.0–8.5)

## 2017-09-09 NOTE — Telephone Encounter (Signed)
-----   Message from Larey Seat, MD sent at 09/09/2017  1:14 PM EDT ----- High MCV and [facility]- patient may have iron or B 12 deficiency - I will send this result to her PCP, Sharilyn Sites, MD, in [place].  Cc Dr Hilma Favors

## 2017-09-09 NOTE — Telephone Encounter (Signed)
Patient is scheduled at [facility] for 10/14/17.

## 2017-09-09 NOTE — Telephone Encounter (Signed)
Called the patient and went over her lab work with her. Dr Dohmeier had already called and spoke with the pt and made her aware. I have faxed over the lab work information to her PCP. Pt was appreciative for the call.

## 2017-09-14 ENCOUNTER — Encounter: Payer: Self-pay | Admitting: Neurology

## 2017-09-14 ENCOUNTER — Telehealth: Payer: Self-pay | Admitting: Neurology

## 2017-09-14 NOTE — Telephone Encounter (Signed)
Called the patient and made her aware that the JCV virus was negative and that we could start Tysabri. The patient states that she would like to hold off her now and wait until she has had her images completed. The patient will call us if she decides she wants to start sooner or will discuss with Dr Dohmeier at her next office visit

## 2017-09-14 NOTE — Telephone Encounter (Signed)
Pt's JCV virus came back negative. Per Dr [name] the pt would be ok to start Tysabri.

## 2017-09-14 NOTE — Telephone Encounter (Signed)
Yes, I would like for her to start on  tysabri-  she was interested in changing to IV therapy. Negative JC virus means she can start with Therapy  .

## 2017-09-14 NOTE — Telephone Encounter (Signed)
When can this patient come to infusion?  Tysabri can start any week now. Marland Kitchen

## 2017-10-06 ENCOUNTER — Other Ambulatory Visit (HOSPITAL_COMMUNITY): Payer: Self-pay | Admitting: Family Medicine

## 2017-10-06 DIAGNOSIS — Z1231 Encounter for screening mammogram for malignant neoplasm of breast: Secondary | ICD-10-CM

## 2017-10-07 ENCOUNTER — Ambulatory Visit (HOSPITAL_COMMUNITY)
Admission: RE | Admit: 2017-10-07 | Discharge: 2017-10-07 | Disposition: A | Discharge status: Home / Self Care | Payer: Medicare Other | Source: Ambulatory Visit | Attending: Family Medicine | Admitting: Family Medicine

## 2017-10-07 DIAGNOSIS — Z1231 Encounter for screening mammogram for malignant neoplasm of breast: Secondary | ICD-10-CM | POA: Diagnosis present

## 2017-10-14 ENCOUNTER — Encounter: Payer: Self-pay | Admitting: Neurology

## 2017-10-15 ENCOUNTER — Telehealth: Payer: Self-pay | Admitting: Neurology

## 2017-10-15 NOTE — Telephone Encounter (Signed)
  I called the patient.  MRI of the brain and cervical cord show white matter changes consistent with multiple sclerosis, no definite new lesions are noted.  Some degenerative changes in the cervical spine, maximal at C5-6 level.  MRI brain 10/14/17:  1.  Numerous white matter lesions consistent with the patient's history of demyelinating disease.  No definite new lesions.  No abnormal enhancement to suggest active demyelinization. 2.  Unchanged mild diffuse cerebral volume loss.    MRI cevical 10/14/17:  1.  Multiple patchy signal abnormalities in the dorsal lateral folliculi of the cervical cord consistent with multiple sclerosis. 2.  Some spondylolisthesis and degenerative bulging disc throughout the cervical spine.  Some buckling of the ligamentum flavum, resulting in mild central stenosis, greatest at the C4-5 level, with minimal cord compression at the C5-6 level with effacement of the CSF around the cord.  Foraminal stenosis at multiple levels as above, mostly right greater than left (inner aspect of the curvature).

## 2018-01-25 ENCOUNTER — Other Ambulatory Visit: Payer: Self-pay | Admitting: Neurology

## 2018-02-10 ENCOUNTER — Ambulatory Visit: Payer: Medicare Other | Admitting: Neurology

## 2018-02-15 ENCOUNTER — Other Ambulatory Visit: Payer: Self-pay | Admitting: Neurology

## 2018-02-15 DIAGNOSIS — G4700 Insomnia, unspecified: Secondary | ICD-10-CM

## 2018-02-15 DIAGNOSIS — K2100 Gastro-esophageal reflux disease with esophagitis, without bleeding: Secondary | ICD-10-CM

## 2018-02-15 DIAGNOSIS — G35 Multiple sclerosis: Secondary | ICD-10-CM

## 2018-02-15 MED ORDER — GLATIRAMER ACETATE 40 MG/ML ~~LOC~~ SOSY: 40.0000 mg | PREFILLED_SYRINGE | SUBCUTANEOUS | 5 refills | Status: DC

## 2018-02-16 ENCOUNTER — Other Ambulatory Visit: Payer: Self-pay | Admitting: Neurology

## 2018-02-16 DIAGNOSIS — K2100 Gastro-esophageal reflux disease with esophagitis, without bleeding: Secondary | ICD-10-CM

## 2018-02-16 DIAGNOSIS — G4700 Insomnia, unspecified: Secondary | ICD-10-CM

## 2018-02-16 DIAGNOSIS — G35 Multiple sclerosis: Secondary | ICD-10-CM

## 2018-03-09 ENCOUNTER — Telehealth: Payer: Self-pay | Admitting: Neurology

## 2018-03-09 DIAGNOSIS — G35 Multiple sclerosis: Secondary | ICD-10-CM

## 2018-03-09 DIAGNOSIS — R42 Dizziness and giddiness: Secondary | ICD-10-CM

## 2018-03-09 NOTE — Telephone Encounter (Signed)
Pt called stating she has had vertigo for about the past 10 days or so, has tried helping with over the counter medication but nothing seem to help. Requesting a call on what she should try.

## 2018-03-09 NOTE — Telephone Encounter (Signed)
The best approach may be a referral to vestibular rehab.  We can also have some eval for gait stability to reduce fall risk.

## 2018-03-10 NOTE — Telephone Encounter (Signed)
Called the patient and there was no answer. LVM informing the patient that Dr [name] would recommend the patient start vestibular rehab with is done through physical therapy. Order for the referral will be placed and they should contact her about scheduling and see if they can help with treating this for her. Instructed the patient to call back if she has any questions.

## 2018-03-10 NOTE — Addendum Note (Signed)
Addended by: Darleen Crocker on: 03/10/2018 09:07 AM   Modules accepted: Orders

## 2018-04-21 ENCOUNTER — Telehealth: Payer: Self-pay | Admitting: *Deleted

## 2018-04-21 NOTE — Telephone Encounter (Signed)
Received fax from [facility] that they have not been able to contact pt, asking for updated number or if change in therapy.  I faxed back to them # that we have and no change in therapy.  I relayed to pt and she stated she had not received calls, texts.  She will contact them.

## 2018-04-23 ENCOUNTER — Other Ambulatory Visit: Payer: Self-pay | Admitting: Neurology

## 2018-07-22 ENCOUNTER — Other Ambulatory Visit: Payer: Self-pay | Admitting: Neurology

## 2018-07-23 ENCOUNTER — Other Ambulatory Visit: Payer: Self-pay | Admitting: Neurology

## 2018-07-23 DIAGNOSIS — G4700 Insomnia, unspecified: Secondary | ICD-10-CM

## 2018-07-23 DIAGNOSIS — K2100 Gastro-esophageal reflux disease with esophagitis, without bleeding: Secondary | ICD-10-CM

## 2018-07-23 DIAGNOSIS — G35 Multiple sclerosis: Secondary | ICD-10-CM

## 2018-08-12 ENCOUNTER — Other Ambulatory Visit: Payer: Self-pay | Admitting: Neurology

## 2018-08-12 DIAGNOSIS — K2100 Gastro-esophageal reflux disease with esophagitis, without bleeding: Secondary | ICD-10-CM

## 2018-08-12 DIAGNOSIS — G4700 Insomnia, unspecified: Secondary | ICD-10-CM

## 2018-08-12 DIAGNOSIS — G35 Multiple sclerosis: Secondary | ICD-10-CM

## 2018-09-06 ENCOUNTER — Other Ambulatory Visit: Payer: Self-pay | Admitting: Neurology

## 2018-09-06 ENCOUNTER — Telehealth: Payer: Self-pay | Admitting: Neurology

## 2018-09-06 DIAGNOSIS — G4700 Insomnia, unspecified: Secondary | ICD-10-CM

## 2018-09-06 DIAGNOSIS — K2100 Gastro-esophageal reflux disease with esophagitis, without bleeding: Secondary | ICD-10-CM

## 2018-09-06 DIAGNOSIS — G35 Multiple sclerosis: Secondary | ICD-10-CM

## 2018-09-06 MED ORDER — GLATIRAMER ACETATE 40 MG/ML ~~LOC~~ SOSY: PREFILLED_SYRINGE | SUBCUTANEOUS | 5 refills | Status: DC

## 2018-09-06 NOTE — Telephone Encounter (Signed)
Pt called to cancel Wednesday's appt due to her having sinus issues and caring for her elderly mother. She would like to know if there is any way that she can have there appt over the phone. Please advise.

## 2018-09-06 NOTE — Telephone Encounter (Signed)
Called the patient and she states that she is stable and has not had MS concerns. She just didn't want to take a risk coming out. I advised she was on my list to call anyways and see about pushing out her apt. I have rescheduled her until the summer time with hope that all this stuff will clear. Patient is scheduled for 01/05/2019 at 10:30am. I will cancel upcoming apt for the pt and make sure her refills will last her until the upcoming apt.

## 2018-09-08 ENCOUNTER — Ambulatory Visit: Payer: Self-pay | Admitting: Neurology

## 2018-09-13 ENCOUNTER — Other Ambulatory Visit: Payer: Self-pay | Admitting: Physician Assistant

## 2018-09-13 ENCOUNTER — Telehealth: Payer: Medicare Other | Admitting: Physician Assistant

## 2018-09-13 DIAGNOSIS — R058 Other specified cough: Secondary | ICD-10-CM

## 2018-09-13 DIAGNOSIS — R05 Cough: Secondary | ICD-10-CM | POA: Diagnosis not present

## 2018-09-13 DIAGNOSIS — J302 Other seasonal allergic rhinitis: Secondary | ICD-10-CM | POA: Diagnosis not present

## 2018-09-13 IMAGING — DX DG SHOULDER 2+V*L*
2 series · 2 of 2 positions shown · non-contrast
Comparison: None.

CLINICAL DATA: 60 y/o  F; fall with pain.

EXAM:
LEFT HUMERUS - 2+ VIEW; LEFT SHOULDER - 2+ VIEW

[shoulder grashey]
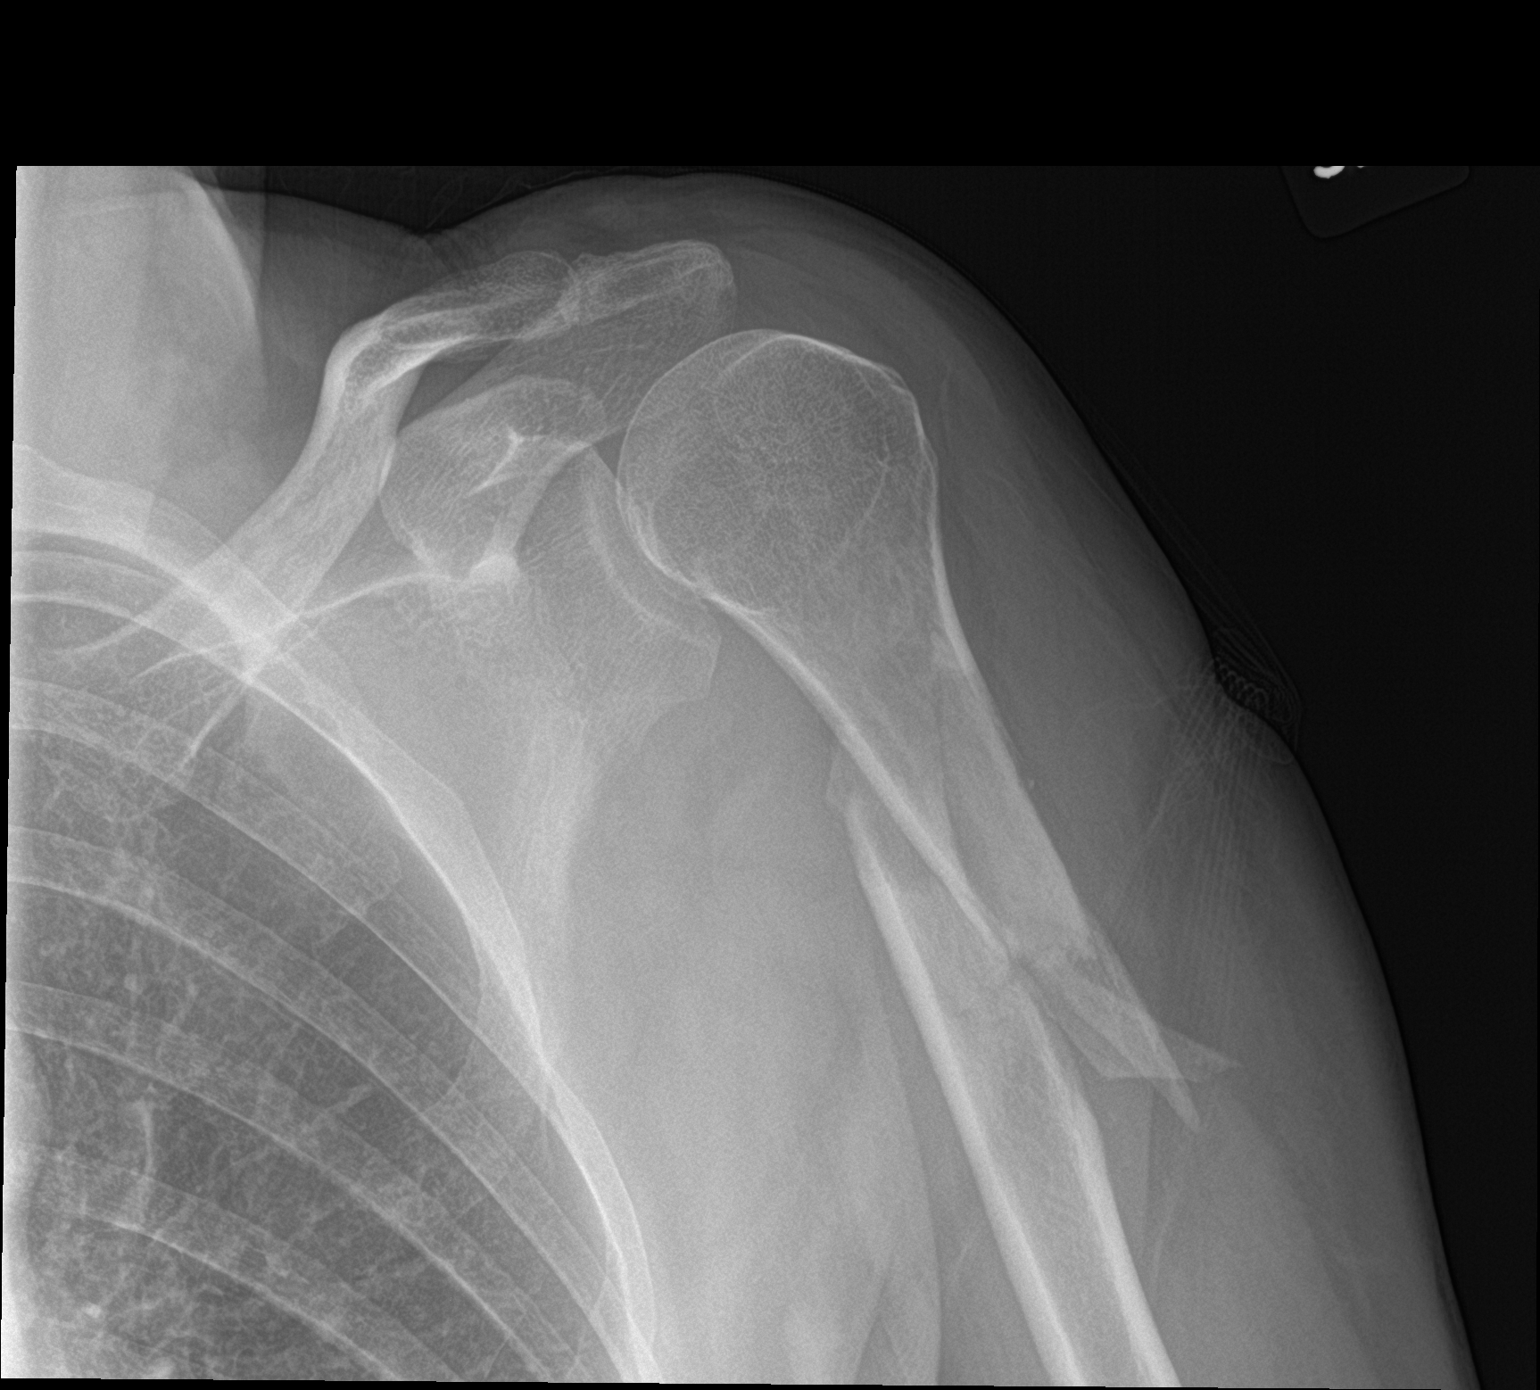

[shoulder y view]
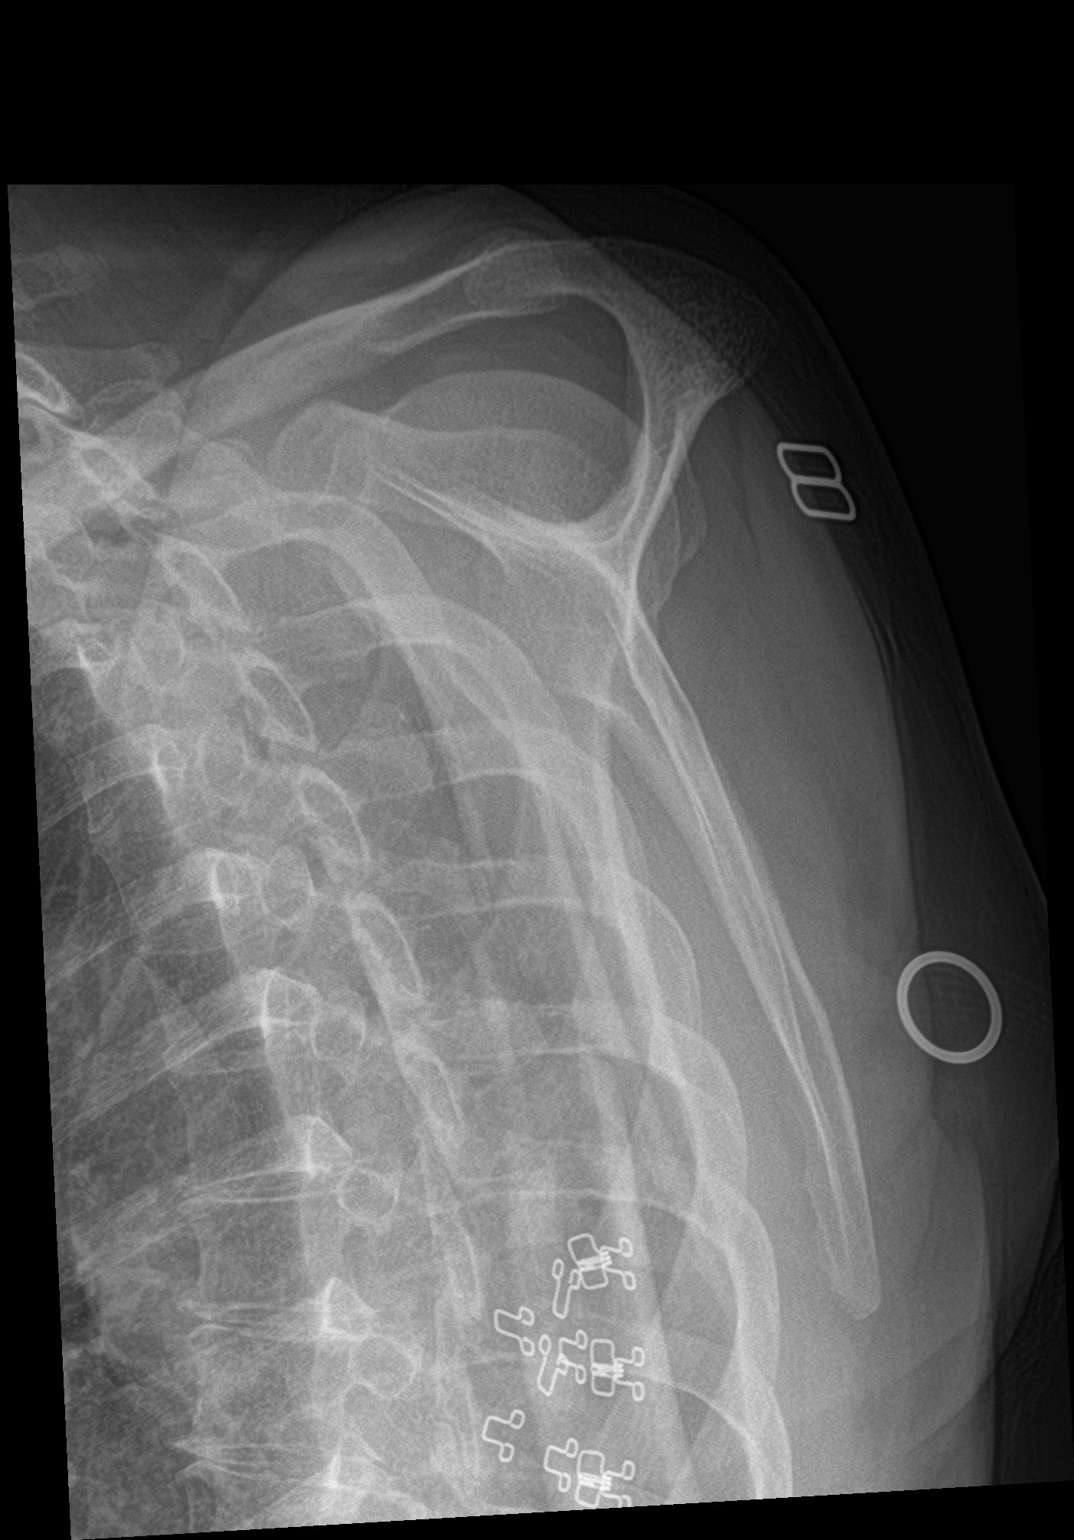

[2 of 2 positions shown; findings below may reference images not displayed]

FINDINGS: Left humerus:

Comminuted acute oblique fracture of the left humerus proximal
diaphysis with [DATE] shaft width lateral displacement of the proximal
fracture component. Elbow joint appears maintained.

Left shoulder:

Comminuted acute oblique fracture of the left proximal humerus
diaphysis with [DATE] shaft's width displacement. Shoulder joint
appears maintained.
IMPRESSION: Comminuted acute oblique fracture of left proximal humerus diaphysis
with [DATE] shaft's width displacement of major fracture components.
Shoulder and elbow joints appear maintained.

By: Cesar Asdrubal Pulecio M.D.

## 2018-09-13 IMAGING — DX DG WRIST COMPLETE 3+V*L*
4 series · 4 of 4 positions shown · non-contrast
Comparison: None.

CLINICAL DATA: Left arm pain.  Status post fall.

EXAM:
LEFT WRIST - COMPLETE 3+ VIEW

[wrist pa]
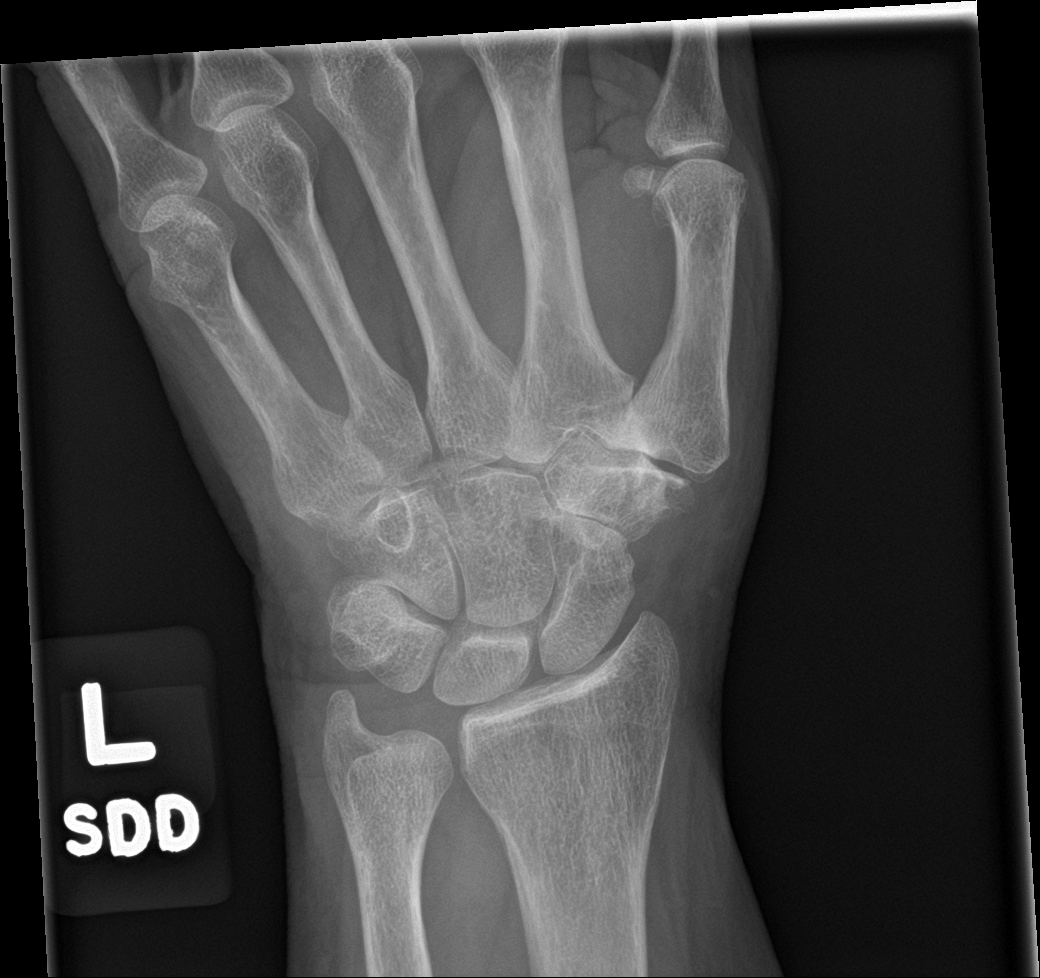

[wrist lat]
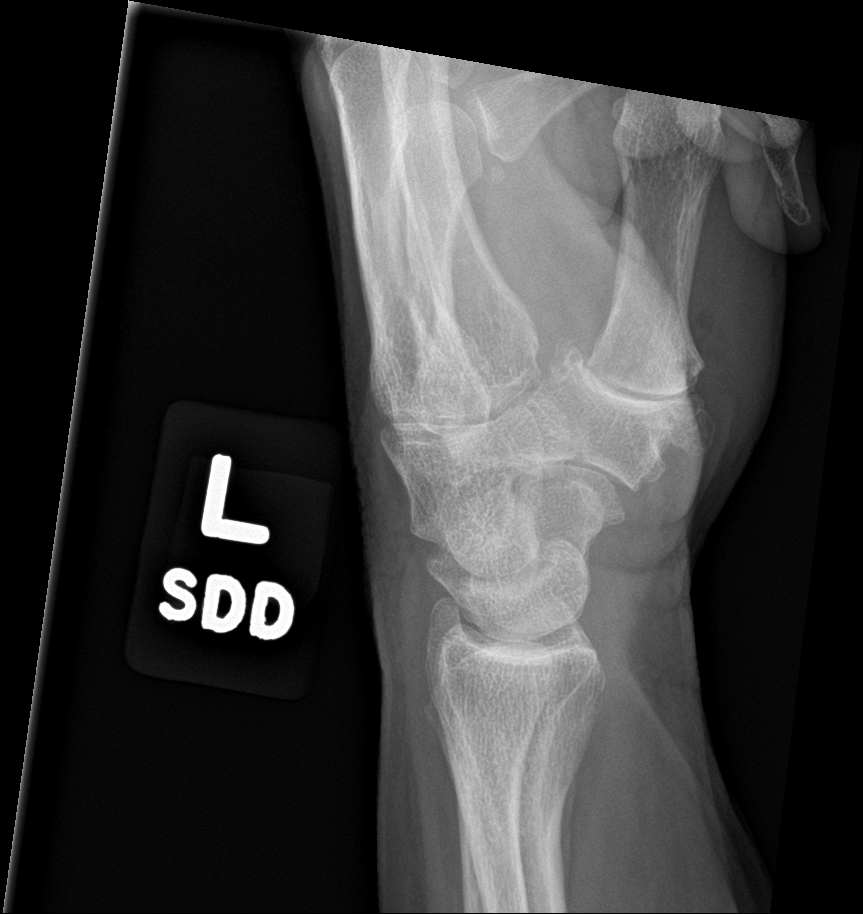

[wrist navicular]
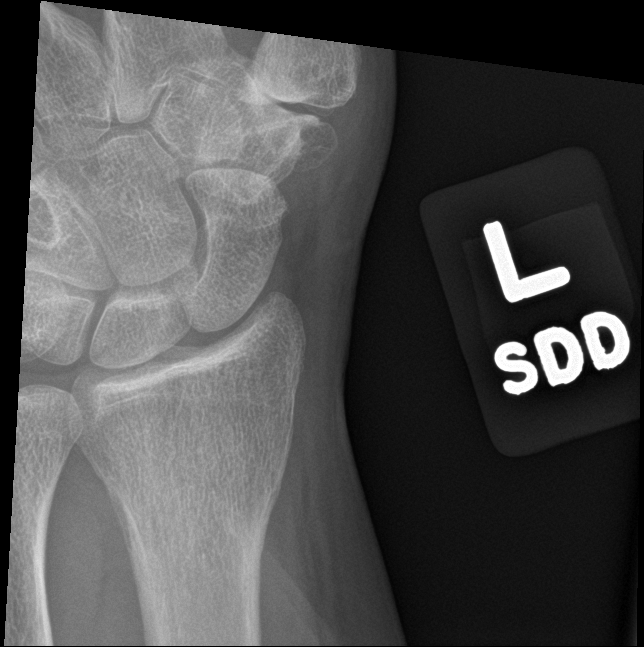

[wrist obl]
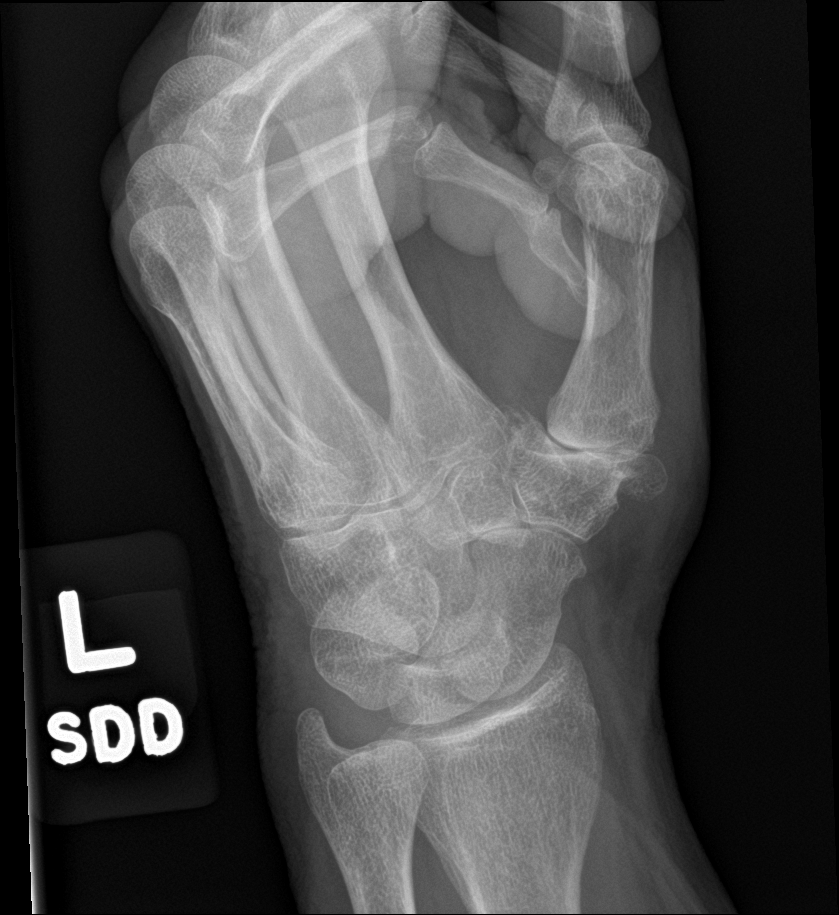

[4 of 4 positions shown; findings below may reference images not displayed]

FINDINGS: No fracture or dislocation. Mild osteoarthritis of the
scaphotrapeziotrapezoid joint. Moderate-severe osteoarthritis of the
first CMC joint. No focal soft tissue abnormality.
IMPRESSION: No acute osseous injury of the left wrist.

## 2018-09-13 MED ORDER — BENZONATATE 100 MG PO CAPS: 100.0000 mg | ORAL_CAPSULE | Freq: Three times a day (TID) | ORAL | 0 refills | Status: DC | PRN

## 2018-09-13 MED ORDER — FLUTICASONE PROPIONATE 50 MCG/ACT NA SUSP: 2.0000 | Freq: Every day | NASAL | 0 refills | Status: DC

## 2018-09-13 NOTE — Progress Notes (Signed)
We are sorry that you are not feeling well.  Here is how we plan to help!  Based on your presentation I believe you most likely have A cough due to allergies.  I recommend that you start the an over-the counter-allergy medication such as Claritin 10 mg or Zyrtec 10 mg daily.  I am sending in a prescription for Flonase for you to use daily to help with the post-nasal drip that is likely causing cough.   In addition you may use A prescription cough medication called Tessalon Perles 100mg . You may take 1-2 capsules every 8 hours as needed for your cough.  If not improving, you will need to be seen in person.  From your responses in the eVisit questionnaire you describe inflammation in the upper respiratory tract which is causing a significant cough.  This is commonly called Bronchitis and has four common causes:    Allergies  Viral Infections  Acid Reflux  Bacterial Infection Allergies, viruses and acid reflux are treated by controlling symptoms or eliminating the cause. An example might be a cough caused by taking certain blood pressure medications. You stop the cough by changing the medication. Another example might be a cough caused by acid reflux. Controlling the reflux helps control the cough.  USE OF BRONCHODILATOR ("RESCUE") INHALERS: There is a risk from using your bronchodilator too frequently.  The risk is that over-reliance on a medication which only relaxes the muscles surrounding the breathing tubes can reduce the effectiveness of medications prescribed to reduce swelling and congestion of the tubes themselves.  Although you feel brief relief from the bronchodilator inhaler, your asthma may actually be worsening with the tubes becoming more swollen and filled with mucus.  This can delay other crucial treatments, such as oral steroid medications. If you need to use a bronchodilator inhaler daily, several times per day, you should discuss this with your provider.  There are probably  better treatments that could be used to keep your asthma under control.     HOME CARE . Only take medications as instructed by your medical team. . Complete the entire course of an antibiotic. . Drink plenty of fluids and get plenty of rest. . Avoid close contacts especially the very young and the elderly . Cover your mouth if you cough or cough into your sleeve. . Always remember to wash your hands . A steam or ultrasonic humidifier can help congestion.   GET HELP RIGHT AWAY IF: . You develop worsening fever. . You become short of breath . You cough up blood. . Your symptoms persist after you have completed your treatment plan MAKE SURE YOU   Understand these instructions.  Will watch your condition.  Will get help right away if you are not doing well or get worse.  Your e-visit answers were reviewed by a board certified advanced clinical practitioner to complete your personal care plan.  Depending on the condition, your plan could have included both over the counter or prescription medications. If there is a problem please reply  once you have received a response from your provider. Your safety is important to [place].  If you have drug allergies check your prescription carefully.    You can use MyChart to ask questions about today's visit, request a non-urgent call back, or ask for a work or school excuse for 24 hours related to this e-Visit. If it has been greater than 24 hours you will need to follow up with your provider, or enter a new  e-Visit to address those concerns. You will get an e-mail in the next two days asking about your experience.  I hope that your e-visit has been valuable and will speed your recovery. Thank you for using e-visits.

## 2018-09-13 NOTE — Progress Notes (Signed)
I have spent 5 minutes in review of e-visit questionnaire, review and updating patient chart, medical decision making and response to patient.   Annemarie Sebree Cody Sheri Gatchel, PA-C    

## 2018-09-27 ENCOUNTER — Telehealth: Payer: Medicare Other | Admitting: Family

## 2018-09-27 DIAGNOSIS — J019 Acute sinusitis, unspecified: Secondary | ICD-10-CM

## 2018-09-27 DIAGNOSIS — B9689 Other specified bacterial agents as the cause of diseases classified elsewhere: Secondary | ICD-10-CM

## 2018-09-27 MED ORDER — AMOXICILLIN-POT CLAVULANATE 875-125 MG PO TABS: 1.0000 | ORAL_TABLET | Freq: Two times a day (BID) | ORAL | 0 refills | Status: DC

## 2018-09-27 NOTE — Progress Notes (Signed)

## 2018-10-10 ENCOUNTER — Other Ambulatory Visit: Payer: Self-pay | Admitting: Physician Assistant

## 2018-10-10 DIAGNOSIS — R058 Other specified cough: Secondary | ICD-10-CM

## 2018-10-10 DIAGNOSIS — J302 Other seasonal allergic rhinitis: Secondary | ICD-10-CM

## 2018-10-10 DIAGNOSIS — R05 Cough: Secondary | ICD-10-CM

## 2018-11-25 ENCOUNTER — Other Ambulatory Visit (HOSPITAL_COMMUNITY): Payer: Self-pay | Admitting: Family Medicine

## 2018-11-25 DIAGNOSIS — Z1231 Encounter for screening mammogram for malignant neoplasm of breast: Secondary | ICD-10-CM

## 2018-12-31 NOTE — Progress Notes (Signed)
PATIENT: [name] DOB: October 18, 1955  REASON FOR VISIT: follow up HISTORY FROM: patient  Virtual Visit via Telephone Note  I connected with [name] on 01/11/19 at  9:30 AM EDT by telephone and verified that I am speaking with the correct person using two identifiers.   I discussed the limitations, risks, security and privacy concerns of performing an evaluation and management service by telephone and the availability of in person appointments. I also discussed with the patient that there may be a patient responsible charge related to this service. The patient expressed understanding and agreed to proceed.   History of Present Illness:  01/11/19 [name] is a 63 y.o. female here today for follow up for MS. she reports that she is doing very well.  She continues Copaxone on Monday, Thursday and Sundays.  She is tolerating medication well.  She denies any new symptoms, specifically numbness, changes in vision, changes in gait or changes in bowel or bladder function.  She does state that heat makes her feel very tired.  She is trying to avoid being out in the heat for prolonged period of time.  She does continue Nexium nearly daily for acid reflux.  She is also taking Viibryd 10 mg daily for anxiety.  These medications have previously been refilled by Dr. [name].  She has been advised to avoid daily use of Nexium in the past.  Last MRI in April 2019 was stable.  History (copied from Dr Dohmeier's note on 09/08/2017)  HPI:  [name] is a 63 y.o. female , who was originally seen here as a referral from Dr. Andris Flurry at the offices of  Dr. Hilma Favors , MD for a transfer of care for multiple sclerosis.  [name] reports that she received the diagnosis of multiple sclerosis at age 84. She was diagnosed by Dr. Arsenio Katz, after years of having symptoms that included skin dysesthesias , numbness and right eye vision loss or blurring of vision- Soon afterwards she was seen by Dr. Dellis Filbert ,a multiple  sclerosis specialist and followed him from the [facility] office to the [facility] office. She is now looking for followup care , also within the network of her other physicians.  Original diagnosis was established by clinical history, abnormal brain MRI and a spinal tap positive for oligoclonal bands. A copy of one of her last MRI is is available to me. Her brain MRI with and without contrast was compared to a study from 2012 and documented only  minimal  Disease progression. The patient had mild ventricular enlargement unchanged for the last 3 years. Multiple areas of increased white matter signal in the periventricular space, significant involvement of the corpus callosum and classic Dawson's fingers. Involvement of white matter demyelination in the temporal area. Dr. Starleen Blue interpreted the study as  moderate to severe disease burden , referenced  to her long-standing multiple sclerosis./ The patient's most recent blood test results were also attached ( referral papers)- she has a normal white blood cell count she's not anemic, there is  normal kidney function ,normal liver function and  she is not diabetic. She's currently controlled on Copaxone  with 40 MG 3 TIMES A WEEK THE PATIENT SWITCHED LAST SUMMER FROM daily to the 3 Times Weekly Formulation.  She has Relapsing Remitting Multiple Sclerosis , she has noticed neither  clinical changes nor side effects since switching in the formulation.    06-20-14  [name] has been stable neurologically and overall since  our last visit. She has changed to the 3 times weekly form of Copaxone without any problems. She already received her pneumonia shot and her flu shot for this year. She resides in [place] and the community has been suffering under a viral infection wave this winter with the respiratory with respiratory symptoms as well as myalgia. She has so f/ar done very well. She has no recent blood test to review. She moved to a one level apartment ,  first floor and is happy with the new neighborhood. She lives alone with a cat.   01-03-2015. [name] is here for her regular routine and refill visit. She had no evidence of any relapses she remains hypertensive height per recent flexible sigmoidoscopy but was normal Babinski responses and she has some proximal muscle rigidity but no cogwheeling associated with it all this speaks for a mild progressive form of MS for progression has been very slow and she has been responding well to the medications. This is her 1 year anniversary of the diagnosis of MS.  01-02-2016, [name] has not felt any impairment, no relapse or remitting symptoms of MS. We will repeat a brain MRI. Her original diagnosis was established by clinical history abnormal brain MRI and oligoclonal bands in her cerebral spinal fluid. We discussed today when she can have her MRI and she would like it in November or even December of this year at [place] location. She would like an open MRI.  Interval history from 01/06/2017 for this established 63 year old Caucasian patient [name], who carries a diagnosis of multiple sclerosis. Heat and humidity affect her. The patient is currently treated with Copaxone 40 mg 3 times a week and has had no evidence of a relapse since. She has been stable for the last 4 years. Her diagnosis was established on 7-15- 2006. She drives, lives independent and alone and is able to perform all activities of daily living, handles her own finances.I like for her to have a new MRI - she would like to have it at [place].   09-08-2017, R for this established patient with MS. Now due for MRI - brain and c spine. She insists on open MRI . She fell  while brooming off cobwebs from the ceiling and broke her left humerus. Went to ED 02-06-2017 , surgery 02-16-2017. She now has a metal plate.     Observations/Objective:  Generalized: Well developed, in no acute distress  Mentation: Alert  oriented to time, place, history taking. Follows all commands speech and language fluent   Assessment and Plan:  62 y.o. year old female  has a past medical history of Arthritis, Colon polyps, GERD (gastroesophageal reflux disease), Hiatal hernia, Hypertension, MS (multiple sclerosis) ([place]) (12/09/2013), Multiple sclerosis ([place]), and Seizures ([place]) (01/04/2005). here with    ICD-10-CM   1. MS (multiple sclerosis) ([place])  G35 esomeprazole (NEXIUM) 40 MG capsule  2. Gastroesophageal reflux disease with esophagitis  K21.0 esomeprazole (NEXIUM) 40 MG capsule  3. Insomnia, controlled  G47.00 esomeprazole (NEXIUM) 40 MG capsule   Alydia is doing very well on Copaxone 3 times weekly.  We will continue this therapy for MS management.  We will also continue Nexium 40 mg for GERD and Viibryd 10 mg daily for anxiety.  She was advised to attempt weaning of Nexium if possible.  We have discussed adverse effects of long-term use of PPIs including bone loss and kidney disease.  She was encouraged to continue being active.  We have  also discussed safety precautions related to COVID-19.  She will follow-up with [place] in 1 year, sooner if needed.  She verbalizes understanding and agreement with this plan.  No orders of the defined types were placed in this encounter.   Meds ordered this encounter  Medications  . Vilazodone HCl (VIIBRYD) 10 MG TABS    Sig: TAKE 1 TABLET BY MOUTH DAILY OR AS DIRECTED BY DOCTOR    Dispense:  90 tablet    Refill:  3    Order Specific Question:   Supervising Provider    Answer:   Melvenia Beam [id number]  . esomeprazole (NEXIUM) 40 MG capsule    Sig: TAKE 1 CAPSULE BY MOUTH DAILY AT 12 NOON    Dispense:  90 capsule    Refill:  3    Order Specific Question:   Supervising Provider    Answer:   Melvenia Beam [[id number]]     Follow Up Instructions:  I discussed the assessment and treatment plan with the patient. The patient was provided an opportunity to ask questions and all  were answered. The patient agreed with the plan and demonstrated an understanding of the instructions.   The patient was advised to call back or seek an in-person evaluation if the symptoms worsen or if the condition fails to improve as anticipated.  I provided 25 minutes of non-face-to-face time during this encounter.  Patient is located at her place of residence during video conference.  Provider is located in the office.  Maryelizabeth Kaufmann, CMA helped to facilitate visit.   Debbora Presto, NP

## 2019-01-05 ENCOUNTER — Ambulatory Visit: Payer: Self-pay | Admitting: Neurology

## 2019-01-11 ENCOUNTER — Encounter: Payer: Self-pay | Admitting: Family Medicine

## 2019-01-11 ENCOUNTER — Telehealth (INDEPENDENT_AMBULATORY_CARE_PROVIDER_SITE_OTHER): Payer: Medicare Other | Admitting: Family Medicine

## 2019-01-11 DIAGNOSIS — G35 Multiple sclerosis: Secondary | ICD-10-CM

## 2019-01-11 DIAGNOSIS — G4700 Insomnia, unspecified: Secondary | ICD-10-CM | POA: Diagnosis not present

## 2019-01-11 DIAGNOSIS — K2100 Gastro-esophageal reflux disease with esophagitis, without bleeding: Secondary | ICD-10-CM

## 2019-01-11 MED ORDER — ESOMEPRAZOLE MAGNESIUM 40 MG PO CPDR
DELAYED_RELEASE_CAPSULE | ORAL | 3 refills | Status: DC
Start: 2019-01-11 — End: 2020-01-30

## 2019-01-11 MED ORDER — VIIBRYD 10 MG PO TABS: ORAL_TABLET | ORAL | 3 refills | Status: DC

## 2019-02-14 ENCOUNTER — Other Ambulatory Visit: Payer: Self-pay | Admitting: Neurology

## 2019-02-14 DIAGNOSIS — K2100 Gastro-esophageal reflux disease with esophagitis, without bleeding: Secondary | ICD-10-CM

## 2019-02-14 DIAGNOSIS — G35 Multiple sclerosis: Secondary | ICD-10-CM

## 2019-02-14 DIAGNOSIS — G4700 Insomnia, unspecified: Secondary | ICD-10-CM

## 2019-07-07 ENCOUNTER — Ambulatory Visit: Payer: Medicare PPO | Attending: Internal Medicine

## 2019-07-07 ENCOUNTER — Other Ambulatory Visit: Payer: Self-pay

## 2019-07-07 DIAGNOSIS — Z20822 Contact with and (suspected) exposure to covid-19: Secondary | ICD-10-CM | POA: Diagnosis not present

## 2019-07-08 LAB — NOVEL CORONAVIRUS, NAA: SARS-CoV-2, NAA: NOT DETECTED

## 2019-08-18 ENCOUNTER — Encounter (INDEPENDENT_AMBULATORY_CARE_PROVIDER_SITE_OTHER): Payer: Self-pay | Admitting: *Deleted

## 2019-08-24 ENCOUNTER — Other Ambulatory Visit (HOSPITAL_COMMUNITY): Payer: Self-pay | Admitting: Family Medicine

## 2019-08-24 DIAGNOSIS — Z1231 Encounter for screening mammogram for malignant neoplasm of breast: Secondary | ICD-10-CM

## 2019-08-25 ENCOUNTER — Other Ambulatory Visit: Payer: Self-pay

## 2019-08-25 ENCOUNTER — Ambulatory Visit (HOSPITAL_COMMUNITY)
Admission: RE | Admit: 2019-08-25 | Discharge: 2019-08-25 | Disposition: A | Discharge status: Home / Self Care | Payer: Medicare PPO | Source: Ambulatory Visit | Attending: Family Medicine | Admitting: Family Medicine

## 2019-08-25 DIAGNOSIS — Z1231 Encounter for screening mammogram for malignant neoplasm of breast: Secondary | ICD-10-CM | POA: Diagnosis not present

## 2019-09-01 ENCOUNTER — Ambulatory Visit: Payer: Medicare PPO | Attending: Internal Medicine

## 2019-09-01 DIAGNOSIS — Z23 Encounter for immunization: Secondary | ICD-10-CM

## 2019-09-01 NOTE — Progress Notes (Signed)
   Covid-19 Vaccination Clinic  Name:  [name]    MRN: [id number] DOB: 12-07-1955  09/01/2019  Ms. [name] was observed post Covid-19 immunization for 15 minutes without incident. She was provided with Vaccine Information Sheet and instruction to access the V-Safe system.   Ms. [name] was instructed to call 911 with any severe reactions post vaccine: Marland Kitchen Difficulty breathing  . Swelling of face and throat  . A fast heartbeat  . A bad rash all over body  . Dizziness and weakness   Immunizations Administered    Name Date Dose VIS Date Route   Moderna COVID-19 Vaccine 09/01/2019 12:02 PM 0.5 mL 05/24/2019 Intramuscular   Manufacturer: Moderna   Lot: [id number]   [id number]

## 2019-09-20 NOTE — Telephone Encounter (Signed)
disregard

## 2019-09-29 DIAGNOSIS — I10 Essential (primary) hypertension: Secondary | ICD-10-CM | POA: Diagnosis not present

## 2019-09-29 DIAGNOSIS — E538 Deficiency of other specified B group vitamins: Secondary | ICD-10-CM | POA: Diagnosis not present

## 2019-09-29 DIAGNOSIS — Z01419 Encounter for gynecological examination (general) (routine) without abnormal findings: Secondary | ICD-10-CM | POA: Diagnosis not present

## 2019-09-29 DIAGNOSIS — Z1389 Encounter for screening for other disorder: Secondary | ICD-10-CM | POA: Diagnosis not present

## 2019-09-29 DIAGNOSIS — G35 Multiple sclerosis: Secondary | ICD-10-CM | POA: Diagnosis not present

## 2019-09-29 DIAGNOSIS — Z719 Counseling, unspecified: Secondary | ICD-10-CM | POA: Diagnosis not present

## 2019-09-29 DIAGNOSIS — R7309 Other abnormal glucose: Secondary | ICD-10-CM | POA: Diagnosis not present

## 2019-09-29 DIAGNOSIS — Z79899 Other long term (current) drug therapy: Secondary | ICD-10-CM | POA: Diagnosis not present

## 2019-09-29 DIAGNOSIS — Z6823 Body mass index (BMI) 23.0-23.9, adult: Secondary | ICD-10-CM | POA: Diagnosis not present

## 2019-09-29 DIAGNOSIS — E559 Vitamin D deficiency, unspecified: Secondary | ICD-10-CM | POA: Diagnosis not present

## 2019-10-04 ENCOUNTER — Ambulatory Visit: Payer: Medicare PPO

## 2019-10-05 ENCOUNTER — Telehealth: Payer: Self-pay | Admitting: *Deleted

## 2019-10-05 NOTE — Telephone Encounter (Signed)
Copaxone PA, key: [id number], on med since at least 2015.  [employer]: PA Case: [id number], Status: Approved, Coverage Starts on: 06/24/2019 12:00:00 AM, Coverage Ends on: 06/22/2020 12:00:00 AM. Questions? Contact [phone].

## 2019-10-06 NOTE — Telephone Encounter (Signed)
Approval letter for Copaxone faxed to [employer].

## 2019-10-26 ENCOUNTER — Other Ambulatory Visit: Payer: Self-pay | Admitting: Neurology

## 2019-10-26 DIAGNOSIS — G4700 Insomnia, unspecified: Secondary | ICD-10-CM

## 2019-10-26 DIAGNOSIS — G35 Multiple sclerosis: Secondary | ICD-10-CM

## 2019-10-26 DIAGNOSIS — K2100 Gastro-esophageal reflux disease with esophagitis, without bleeding: Secondary | ICD-10-CM

## 2019-10-26 MED ORDER — GLATIRAMER ACETATE 40 MG/ML ~~LOC~~ SOSY: PREFILLED_SYRINGE | SUBCUTANEOUS | 5 refills | Status: DC

## 2019-11-12 ENCOUNTER — Encounter (HOSPITAL_COMMUNITY): Payer: Self-pay | Admitting: Emergency Medicine

## 2019-11-12 ENCOUNTER — Other Ambulatory Visit: Payer: Self-pay

## 2019-11-12 ENCOUNTER — Emergency Department (HOSPITAL_COMMUNITY)
Admission: EM | Admit: 2019-11-12 | Discharge: 2019-11-12 | Disposition: A | Discharge status: Home / Self Care | Payer: Medicare PPO | Attending: Emergency Medicine | Admitting: Emergency Medicine

## 2019-11-12 DIAGNOSIS — R195 Other fecal abnormalities: Secondary | ICD-10-CM | POA: Diagnosis not present

## 2019-11-12 DIAGNOSIS — Z5321 Procedure and treatment not carried out due to patient leaving prior to being seen by health care provider: Secondary | ICD-10-CM | POA: Insufficient documentation

## 2019-11-12 NOTE — ED Triage Notes (Signed)
Pt c/o having one bright bloody stool last night and black stools since then.

## 2019-11-14 ENCOUNTER — Other Ambulatory Visit: Payer: Self-pay

## 2019-11-14 ENCOUNTER — Emergency Department (HOSPITAL_COMMUNITY): Payer: Medicare PPO

## 2019-11-14 ENCOUNTER — Emergency Department (HOSPITAL_COMMUNITY)
Admission: EM | Admit: 2019-11-14 | Discharge: 2019-11-14 | Disposition: A | Discharge status: Home / Self Care | Payer: Medicare PPO | Attending: Emergency Medicine | Admitting: Emergency Medicine

## 2019-11-14 ENCOUNTER — Encounter (HOSPITAL_COMMUNITY): Payer: Self-pay | Admitting: *Deleted

## 2019-11-14 DIAGNOSIS — E871 Hypo-osmolality and hyponatremia: Secondary | ICD-10-CM | POA: Diagnosis not present

## 2019-11-14 DIAGNOSIS — K5790 Diverticulosis of intestine, part unspecified, without perforation or abscess without bleeding: Secondary | ICD-10-CM | POA: Insufficient documentation

## 2019-11-14 DIAGNOSIS — F17210 Nicotine dependence, cigarettes, uncomplicated: Secondary | ICD-10-CM | POA: Insufficient documentation

## 2019-11-14 DIAGNOSIS — R110 Nausea: Secondary | ICD-10-CM | POA: Diagnosis not present

## 2019-11-14 DIAGNOSIS — K921 Melena: Secondary | ICD-10-CM | POA: Diagnosis not present

## 2019-11-14 DIAGNOSIS — R935 Abnormal findings on diagnostic imaging of other abdominal regions, including retroperitoneum: Secondary | ICD-10-CM | POA: Insufficient documentation

## 2019-11-14 DIAGNOSIS — R197 Diarrhea, unspecified: Secondary | ICD-10-CM | POA: Insufficient documentation

## 2019-11-14 DIAGNOSIS — R1030 Lower abdominal pain, unspecified: Secondary | ICD-10-CM | POA: Diagnosis present

## 2019-11-14 DIAGNOSIS — I10 Essential (primary) hypertension: Secondary | ICD-10-CM | POA: Diagnosis not present

## 2019-11-14 DIAGNOSIS — R1032 Left lower quadrant pain: Secondary | ICD-10-CM | POA: Diagnosis not present

## 2019-11-14 DIAGNOSIS — R109 Unspecified abdominal pain: Secondary | ICD-10-CM | POA: Diagnosis not present

## 2019-11-14 DIAGNOSIS — Z6823 Body mass index (BMI) 23.0-23.9, adult: Secondary | ICD-10-CM | POA: Diagnosis not present

## 2019-11-14 DIAGNOSIS — Z79899 Other long term (current) drug therapy: Secondary | ICD-10-CM | POA: Insufficient documentation

## 2019-11-14 LAB — URINALYSIS, ROUTINE W REFLEX MICROSCOPIC
Bacteria, UA: NONE SEEN
Bilirubin Urine: NEGATIVE
Glucose, UA: NEGATIVE mg/dL
Hgb urine dipstick: NEGATIVE
Ketones, ur: NEGATIVE mg/dL
Nitrite: NEGATIVE
Protein, ur: NEGATIVE mg/dL
Specific Gravity, Urine: 1.004 — ABNORMAL LOW (ref 1.005–1.030)
pH: 6 (ref 5.0–8.0)

## 2019-11-14 LAB — LIPASE, BLOOD: Lipase: 40 U/L (ref 11–51)

## 2019-11-14 LAB — CBC WITH DIFFERENTIAL/PLATELET
Abs Immature Granulocytes: 0.04 10*3/uL (ref 0.00–0.07)
Basophils Absolute: 0.1 10*3/uL (ref 0.0–0.1)
Basophils Relative: 1 %
Eosinophils Absolute: 0 10*3/uL (ref 0.0–0.5)
Eosinophils Relative: 0 %
HCT: 49 % — ABNORMAL HIGH (ref 36.0–46.0)
Hemoglobin: 16.6 g/dL — ABNORMAL HIGH (ref 12.0–15.0)
Immature Granulocytes: 1 %
Lymphocytes Relative: 24 %
Lymphs Abs: 1.8 10*3/uL (ref 0.7–4.0)
MCH: 33.2 pg (ref 26.0–34.0)
MCHC: 33.9 g/dL (ref 30.0–36.0)
MCV: 98 fL (ref 80.0–100.0)
Monocytes Absolute: 0.5 10*3/uL (ref 0.1–1.0)
Monocytes Relative: 7 %
Neutro Abs: 5.2 10*3/uL (ref 1.7–7.7)
Neutrophils Relative %: 67 %
Platelets: 257 10*3/uL (ref 150–400)
RBC: 5 MIL/uL (ref 3.87–5.11)
RDW: 12.6 % (ref 11.5–15.5)
WBC: 7.6 10*3/uL (ref 4.0–10.5)
nRBC: 0 % (ref 0.0–0.2)

## 2019-11-14 LAB — COMPREHENSIVE METABOLIC PANEL
ALT: 44 U/L (ref 0–44)
AST: 43 U/L — ABNORMAL HIGH (ref 15–41)
Albumin: 4.9 g/dL (ref 3.5–5.0)
Alkaline Phosphatase: 64 U/L (ref 38–126)
Anion gap: 12 (ref 5–15)
BUN: 5 mg/dL — ABNORMAL LOW (ref 8–23)
CO2: 25 mmol/L (ref 22–32)
Calcium: 9.7 mg/dL (ref 8.9–10.3)
Chloride: 91 mmol/L — ABNORMAL LOW (ref 98–111)
Creatinine, Ser: 0.68 mg/dL (ref 0.44–1.00)
GFR calc Af Amer: >60 mL/min (ref >60)
GFR calc non Af Amer: >60 mL/min (ref >60)
Glucose, Bld: 101 mg/dL — ABNORMAL HIGH (ref 70–99)
Potassium: 3.4 mmol/L — ABNORMAL LOW (ref 3.5–5.1)
Sodium: 128 mmol/L — ABNORMAL LOW (ref 135–145)
Total Bilirubin: 0.8 mg/dL (ref 0.3–1.2)
Total Protein: 7.7 g/dL (ref 6.5–8.1)

## 2019-11-14 LAB — POC OCCULT BLOOD, ED: Fecal Occult Bld: NEGATIVE

## 2019-11-14 MED ORDER — LOPERAMIDE HCL 2 MG PO CAPS
2.0000 mg | ORAL_CAPSULE | Freq: Four times a day (QID) | ORAL | 0 refills | Status: DC | PRN
Start: 2019-11-14 — End: 2020-01-05

## 2019-11-14 MED ORDER — IOHEXOL 300 MG/ML  SOLN
100.0000 mL | Freq: Once | INTRAMUSCULAR | Status: AC | PRN
  Administered 2019-11-14: 100 mL via INTRAVENOUS

## 2019-11-14 MED ORDER — ONDANSETRON HCL 4 MG PO TABS
4.0000 mg | ORAL_TABLET | Freq: Three times a day (TID) | ORAL | 0 refills | Status: DC | PRN
Start: 2019-11-14 — End: 2020-01-05

## 2019-11-14 MED ORDER — SODIUM CHLORIDE 0.9 % IV SOLN: Freq: Once | INTRAVENOUS | Status: AC

## 2019-11-14 MED ORDER — POTASSIUM CHLORIDE CRYS ER 20 MEQ PO TBCR
20.0000 meq | EXTENDED_RELEASE_TABLET | Freq: Once | ORAL | Status: AC
  Administered 2019-11-14: 20 meq via ORAL
  Filled 2019-11-14: qty 1

## 2019-11-14 NOTE — ED Triage Notes (Addendum)
Pt c/o abdominal pain that started a week ago and rectal bleeding that started Thursday. Pt reports she is being treated with antibiotics for diverticulitis with no improvement in symptoms. Pt denies any rectal bleeding now. Denies vomiting. Pt was sent by MD.

## 2019-11-14 NOTE — Discharge Instructions (Addendum)
Your work-up today showed evidence of diverticulosis but no evidence of diverticulitis.  I would recommend stopping the antibiotics that you were prescribed at this time.  Your CT scan showed a small cyst along the pancreas.  Current recommendations are to obtain an MRI in 1 year.  This can be arranged by your PCP or your gastroenterologist.  You can take Imodium if the diarrhea is very bothersome.  Please eat a bland diet for the next few days.  Please stay hydrated.  Avoid spicy foods or fried foods.  Continue taking your probiotic daily.  You can take Zofran as needed for nausea.  Wait around 10 or 15 minutes before you have anything to eat or drink to get this medicine time to work.  Your sodium today was low.  This is typically replenished with an IV or orally.  We discussed the utility of admission to the hospital but you elected to go home at this time which I think is reasonable given you are able to eat and drink.  I would recommend follow-up with your PCP for repeat blood work within 2-5 days to recheck your sodium levels and make sure that they are improving.  Your sodium level today was 128.  I would recommend follow-up with your gastroenterologist for follow-up and for colonoscopy.  Please tell them that the provider in the emergency department recommended that you obtain your colonoscopy urgently.  Return to the emergency department if any concerning signs or symptoms develop such as fevers, vomiting, severe uncontrolled pain.

## 2019-11-14 NOTE — ED Provider Notes (Signed)
[facility] EMERGENCY DEPARTMENT Provider Note   CSN: [id number] Arrival date & time: 11/14/19  1112     History Chief Complaint  Patient presents with  . Abdominal Pain    [name] is a 64 y.o. female with history of MS, GERD, hypertension, osteoarthritis presents for evaluation of acute onset, persistent and progressively worsening lower abdominal pain for 8 days.  Reports pain is throbbing, no aggravating or alleviating factors noted, worse along the left lower quadrant.  Reports that she has had diverticulitis previously and this feels similar though is in a different place.  She called her PCP who started her on Cipro and Flagyl on Tuesday 6 days ago.  She reports compliance with the medication without relief.  On Thursday she had an episode of bright red blood with her bowel movement and ever since her symptoms began she notes that her stools have been black.  She has not been taking Pepto-Bismol.  She notes her stools have been more watery.  She notes nausea but no vomiting.  She denies fevers, urinary symptoms.  She has not tried anything else for her symptoms.  Reports her PCP sent her here for a CT scan and further work-up.  The history is provided by the patient.       Past Medical History:  Diagnosis Date  . Arthritis    Osteoarthritis  . Colon polyps   . GERD (gastroesophageal reflux disease)   . Hiatal hernia   . Hypertension   . MS (multiple sclerosis) ([place]) 12/09/2013  . Multiple sclerosis ([place])   . Seizures ([place]) 01/04/2005   1 and Only    Patient Active Problem List   Diagnosis Date Noted  . Proximal humerus fracture 02/16/2017  . Insomnia, controlled 01/06/2017  . MS (multiple sclerosis) ([place]) 12/09/2013    Past Surgical History:  Procedure Laterality Date  . COLONOSCOPY    . COLONOSCOPY N/A 09/16/2012   Procedure: COLONOSCOPY;  Surgeon: Rogene Houston, MD;  [facility];  Service: Endoscopy;  Laterality: N/A;  1030-rescheduled to 9:30 Ann  notified pt  . DILATION AND CURETTAGE OF UTERUS    . ORIF HUMERUS FRACTURE Left 02/16/2017   Procedure: OPEN REDUCTION INTERNAL FIXATION (ORIF) LEFT PROXIMAL HUMERUS FRACTURE;  Surgeon: Meredith Pel, MD;  Location: [place];  Service: Orthopedics;  Laterality: Left;     OB History   No obstetric history on file.     Family History  Problem Relation Age of Onset  . Stomach cancer Father   . Sick sinus syndrome Father   . Colon cancer Neg Hx     Social History   Tobacco Use  . Smoking status: Current Every Day Smoker    Packs/day: 0.75    Years: 30.00    Pack years: 22.50    Types: Cigarettes  . Smokeless tobacco: Never Used  Substance Use Topics  . Alcohol use: Yes    Alcohol/week: 8.0 standard drinks    Types: 8 Cans of beer per week    Comment: 4-5 beers daily   . Drug use: No    Home Medications Prior to Admission medications   Medication Sig Start Date End Date Taking? Authorizing Provider  amoxicillin-clavulanate (AUGMENTIN) 875-125 MG tablet Take 1 tablet by mouth 2 (two) times daily. 09/27/18   Kennyth Arnold, FNP  Artificial Tear Solution (SOOTHE XP OP) Place 1 drop into both eyes 3 (three) times daily as needed (for dry/irritated eyes).     [provider]  benzonatate (TESSALON) 100 MG capsule Take 1 capsule (100 mg total) by mouth 3 (three) times daily as needed for cough. 09/13/18   Brunetta Jeans, PA-C  cholecalciferol (VITAMIN D) 400 units TABS tablet Take 400 Units by mouth daily.    [provider]  Cyanocobalamin (VITAMIN B-12) 500 MCG SUBL Place under the tongue.    [provider]  diazepam (VALIUM) 5 MG tablet Take 2.5 mg by mouth at bedtime as needed (sleep).     [provider]  esomeprazole (NEXIUM) 40 MG capsule TAKE 1 CAPSULE BY MOUTH DAILY AT 12 NOON 01/11/19   Lomax, Amy, NP  fluticasone (FLONASE) 50 MCG/ACT nasal spray Place 2 sprays into both nostrils daily. 09/13/18   Brunetta Jeans, PA-C  Glatiramer  Acetate (COPAXONE) 40 MG/ML SOSY INJECT 40MG  SUBCUTANEOUSLY  THREE TIMES WEEKLY AT LEAST 48 HOURS APART ON MONDAY,  THURSDAY, AND SUNDAY  EVENING 10/26/19   Dohmeier, Asencion Partridge, MD  ibuprofen (ADVIL,MOTRIN) 200 MG tablet Take 600 mg by mouth every 8 (eight) hours as needed for moderate pain (for pain.).    [provider]  loperamide (IMODIUM) 2 MG capsule Take 1 capsule (2 mg total) by mouth 4 (four) times daily as needed for diarrhea or loose stools. 11/14/19   Almeta Geisel A, PA-C  losartan (COZAAR) 50 MG tablet Take 75 mg by mouth daily.    [provider]  ondansetron (ZOFRAN) 4 MG tablet Take 1 tablet (4 mg total) by mouth every 8 (eight) hours as needed for nausea or vomiting. 11/14/19   Rodell Perna A, PA-C  Probiotic Product (PROBIOTIC PO) Take 1 capsule by mouth daily.    [provider]  Vilazodone HCl (VIIBRYD) 10 MG TABS TAKE 1 TABLET BY MOUTH DAILY OR AS DIRECTED BY DOCTOR 01/11/19   Lomax, Amy, NP    Allergies    Sulfa antibiotics, Codeine, and Dilaudid [hydromorphone hcl]  Review of Systems   Review of Systems  Constitutional: Negative for chills and fever.  Respiratory: Negative for shortness of breath.   Cardiovascular: Negative for chest pain.  Gastrointestinal: Positive for abdominal pain, blood in stool, diarrhea and nausea. Negative for vomiting.  Genitourinary: Negative for dysuria, frequency and hematuria.  All other systems reviewed and are negative.   Physical Exam Updated Vital Signs BP 137/81   Pulse 67   Temp (!) 97.5 F (36.4 C) (Oral)   Ht 5' 5.5" (1.664 m)   Wt 62.6 kg   SpO2 100%   BMI 22.62 kg/m   Physical Exam Vitals and nursing note reviewed.  Constitutional:      General: She is not in acute distress.    Appearance: She is well-developed.     Comments: Resting comfortably in bed  HENT:     Head: Normocephalic and atraumatic.  Eyes:     General:        Right eye: No discharge.        Left eye: No discharge.      Conjunctiva/sclera: Conjunctivae normal.  Neck:     Vascular: No JVD.     Trachea: No tracheal deviation.  Cardiovascular:     Rate and Rhythm: Normal rate and regular rhythm.  Pulmonary:     Effort: Pulmonary effort is normal.  Abdominal:     General: Abdomen is flat. Bowel sounds are increased. There is no distension.     Palpations: Abdomen is soft.     Tenderness: There is abdominal tenderness in the  suprapubic area and left lower quadrant. There is no right CVA tenderness, left CVA tenderness, guarding or rebound.  Genitourinary:    Comments: Examination performed in the presence of a chaperone.  Patient has one small external hemorrhoid, nonthrombosed or bleeding.  No tenderness to palpation.  No frank rectal bleeding.  She is a moderate amount of brown and nonbloody stool in the rectal vault. Skin:    General: Skin is warm and dry.     Findings: No erythema.  Neurological:     Mental Status: She is alert.  Psychiatric:        Behavior: Behavior normal.     ED Results / Procedures / Treatments   Labs (all labs ordered are listed, but only abnormal results are displayed) Labs Reviewed  CBC WITH DIFFERENTIAL/PLATELET - Abnormal; Notable for the following components:      Result Value   Hemoglobin 16.6 (*)    HCT 49.0 (*)    All other components within normal limits  COMPREHENSIVE METABOLIC PANEL - Abnormal; Notable for the following components:   Sodium 128 (*)    Potassium 3.4 (*)    Chloride 91 (*)    Glucose, Bld 101 (*)    BUN 5 (*)    AST 43 (*)    All other components within normal limits  URINALYSIS, ROUTINE W REFLEX MICROSCOPIC - Abnormal; Notable for the following components:   Specific Gravity, Urine 1.004 (*)    Leukocytes,Ua TRACE (*)    All other components within normal limits  LIPASE, BLOOD  POC OCCULT BLOOD, ED    EKG None  Radiology CT ABDOMEN PELVIS W CONTRAST  Result Date: 11/14/2019 CLINICAL DATA:  Abdominal pain, rectal bleeding, being  treated for diverticulitis, no improvement in symptoms EXAM: CT ABDOMEN AND PELVIS WITH CONTRAST TECHNIQUE: Multidetector CT imaging of the abdomen and pelvis was performed using the standard protocol following bolus administration of intravenous contrast. CONTRAST:  169mL OMNIPAQUE IOHEXOL 300 MG/ML  SOLN COMPARISON:  CT abdomen, 07/27/2009 FINDINGS: Lower chest: No acute abnormality. Hepatobiliary: No solid liver abnormality is seen. No gallstones, gallbladder wall thickening, or biliary dilatation. Pancreas: There is a 1.0 cm fluid attenuation lesion of the pancreatic uncinate, which appears new in comparison to remote prior examination dated 07/27/2009. No pancreatic ductal dilatation or surrounding inflammatory changes. Spleen: Normal in size without significant abnormality. Adrenals/Urinary Tract: Adrenal glands are unremarkable. Kidneys are normal, without renal calculi, solid lesion, or hydronephrosis. Bladder is unremarkable. Stomach/Bowel: Stomach is within normal limits. Appendix appears normal. Severe descending and sigmoid diverticulosis without evidence of acute diverticulitis. The colon is fluid-filled to the rectum. Vascular/Lymphatic: Aortic atherosclerosis. No enlarged abdominal or pelvic lymph nodes. Reproductive: No mass or other significant abnormality. Other: No abdominal wall hernia or abnormality. No abdominopelvic ascites. Musculoskeletal: No acute or significant osseous findings. IMPRESSION: 1. Severe descending and sigmoid diverticulosis without evidence of acute diverticulitis. 2. The colon is fluid-filled to the rectum, in keeping with diarrheal illness. 3. There is a 1.0 cm fluid attenuation lesion of the pancreatic uncinate, which appears new in comparison to remote prior examination dated 07/27/2009. Findings are most consistent with a small IPMN. Recommend initial follow-up multiphasic contrast enhanced pancreatic protocol MRI at 1 year to establish initial stability. This  recommendation follows ACR consensus guidelines: Management of Incidental Pancreatic Cysts: A White Paper of the ACR Incidental Findings Committee. J Am Coll Radiol [id number]. 4.  Aortic Atherosclerosis (ICD10-I70.0). Electronically Signed   By: Eddie Candle M.D.   On: 11/14/2019  14:42    Procedures Procedures (including critical care time)  Medications Ordered in ED Medications  0.9 %  sodium chloride infusion ( Intravenous New Bag/Given 11/14/19 1340)  iohexol (OMNIPAQUE) 300 MG/ML solution 100 mL (100 mLs Intravenous Contrast Given 11/14/19 1405)  potassium chloride SA (KLOR-CON) CR tablet 20 mEq (20 mEq Oral Given 11/14/19 1620)    ED Course  I have reviewed the triage vital signs and the nursing notes.  Pertinent labs & imaging results that were available during my care of the patient were reviewed by me and considered in my medical decision making (see chart for details).    MDM Rules/Calculators/A&P                      Patient presenting for evaluation of lower abdominal pain with diarrhea for around 1 week.  She is afebrile, vital signs are stable.  She is nontoxic in appearance.  Abdomen is soft with no rebound or guarding.  Lab work reviewed and interpreted by myself shows elevated hemoglobin and hematocrit which could be in the setting of hemoconcentration due to dehydration or in the setting of her known tobacco abuse.  I suspect that she is likely dehydrated because she is also hyponatremic with a sodium of 128 a mildly hypokalemic with a potassium of 3.4.  However she does not have any evidence of AKI or renal insufficiency which is reassuring.  Her AST is very minimally elevated but I doubt this is of clinical significance today as she has no upper abdominal pain and the remainder of her LFTs are within normal limits.  Stools are heme-negative and she has no evidence of frank rectal bleeding.  She noted that her stools were black at home but they are not black on my  assessment at all.  She does have an external hemorrhoid and possibly was experiencing bright red blood per rectum on Thursday as a result of that and has not had any since then.  Given reassuring vital signs and a hemoglobin of 16 I do not feel strongly that she has a GI bleed at this time.  Her imaging today shows severe descending and sigmoid diverticulosis without any evidence of acute diverticulitis.  She does have evidence of diarrheal illness with fluid-filled colon.  There is also a 1.0 cm fluid attenuation lesion of the pancreatic uncinate which appears new in comparison to exam in 2011.  Radiology feels this is most consistent with IPMN and recommends MRI in 1 year for further evaluation.  I informed the patient of these findings.  She does state that she would like to follow-up with her gastroenterologist Dr. Laural Golden for this which I think is reasonable.  Her lipase today is within normal limits and I doubt pancreatitis.  At this time there is no evidence of acute surgical abdominal pathology.  On reevaluation she is resting comfortably in no distress.  She has refused pain medicine and nausea medicine in the ED.  She was given IV fluids at a rate of 125 cc/h for her hyponatremia.  We discussed the possibility of admission for observation and rehydration and recheck of her electrolytes in the morning but she would like to go home.  Given that she is tolerating p.o. food and fluids I think this is reasonable.  We discussed advancing diet and pushing fluids.  She will follow-up with her gastroenterologist for reevaluation of her abnormal CT scan and also to schedule colonoscopy which she states she is due for.  She will follow-up with her PCP in the next couple of days for recheck of her electrolytes to ensure her hyponatremia improves.  Discussed strict ED return precautions. Patient verbalized understanding of and agreement with plan and is safe for discharge home at this time.  Discussed with Dr. Eulis Foster  who agrees with assessment and plan at this time.   Final Clinical Impression(s) / ED Diagnoses Final diagnoses:  Diarrhea, unspecified type  Diverticulosis  Hyponatremia  Abnormal abdominal CT scan    Rx / DC Orders ED Discharge Orders         Ordered    ondansetron (ZOFRAN) 4 MG tablet  Every 8 hours PRN     11/14/19 1619    loperamide (IMODIUM) 2 MG capsule  4 times daily PRN     11/14/19 1619           Renita Papa, PA-C 11/14/19 1642    Daleen Bo, MD 11/15/19 1145

## 2019-11-18 DIAGNOSIS — E871 Hypo-osmolality and hyponatremia: Secondary | ICD-10-CM | POA: Diagnosis not present

## 2019-11-22 ENCOUNTER — Encounter (INDEPENDENT_AMBULATORY_CARE_PROVIDER_SITE_OTHER): Payer: Self-pay | Admitting: Gastroenterology

## 2019-11-22 ENCOUNTER — Ambulatory Visit (INDEPENDENT_AMBULATORY_CARE_PROVIDER_SITE_OTHER): Payer: Medicare PPO | Admitting: Gastroenterology

## 2019-11-22 ENCOUNTER — Other Ambulatory Visit: Payer: Self-pay

## 2019-11-22 VITALS — BP 128/83 | HR 69 | Temp 96.8°F | Ht 65.5 in | Wt 135.5 lb

## 2019-11-22 DIAGNOSIS — K5732 Diverticulitis of large intestine without perforation or abscess without bleeding: Secondary | ICD-10-CM

## 2019-11-22 DIAGNOSIS — Z86010 Personal history of colonic polyps: Secondary | ICD-10-CM

## 2019-11-22 DIAGNOSIS — K219 Gastro-esophageal reflux disease without esophagitis: Secondary | ICD-10-CM

## 2019-11-22 NOTE — Progress Notes (Signed)
Patient profile: [name] is a 64 y.o. female seen for ER follow-up.  Last seen in clinic 2014  History of Present Illness: [name] is seen today for ER f/up - seen in ER 11/15/19 for abdominal pain that was not improving after 6 days of Cipro and Flagyl.  Had CT as below.  Patient reports having left lower quadrant pain for a week or 2 prior to an ER visit.  She has had similar abdominal pain in the past and it felt like it was diverticulitis symptoms.  She was given Cipro Flagyl and after 7 days of symptoms not improving she was seen in the ED.  She reports a few days after her ED visit her abdominal pain improved without additional antibiotics.  She has continues to feel bloated and have a swelling with limited appetite.  Reports that now she has had diarrhea over the past 2 days including nocturnal diarrhea last night.  She has used Imodium today to decrease the diarrhea.  Had 3-4 episodes overnight of diarrhea.  She denies any rectal bleeding since ER visit.  She is eating a bland diet since diverticulitis and has lost a few pounds as below.  Does note she had salad right before diarrhea began.  She denies any nausea or vomiting.  She takes Nexium as needed for GERD symptoms.  No dysphagia.  She smokes 3/4 pack a day and has 2-3 beers about every other day. Denies heavy nsaids.   Wt Readings from Last 3 Encounters:  11/22/19 135 lb 8 oz (61.5 kg)  11/14/19 138 lb (62.6 kg)  11/12/19 139 lb (63 kg)     Last Colonoscopy: 2014-Impression:  Examination performed cecum. Pancolonic diverticulosis but most of the diverticula at sigmoid and descending colon. Small external hemorrhoids and two anal papillae. No evidence of recurrent polyps       Past Medical History:  Past Medical History:  Diagnosis Date  . Arthritis    Osteoarthritis  . Colon polyps   . GERD (gastroesophageal reflux disease)   . Hiatal hernia   . Hypertension   . MS (multiple sclerosis) ([place]) 12/09/2013    . Multiple sclerosis ([place])   . Seizures ([place]) 01/04/2005   1 and Only    Problem List: Patient Active Problem List   Diagnosis Date Noted  . Proximal humerus fracture 02/16/2017  . Insomnia, controlled 01/06/2017  . MS (multiple sclerosis) ([place]) 12/09/2013    Past Surgical History: Past Surgical History:  Procedure Laterality Date  . COLONOSCOPY    . COLONOSCOPY N/A 09/16/2012   Procedure: COLONOSCOPY;  Surgeon: Rogene Houston, MD;  [facility];  Service: Endoscopy;  Laterality: N/A;  1030-rescheduled to 9:30 Ann notified pt  . DILATION AND CURETTAGE OF UTERUS    . ORIF HUMERUS FRACTURE Left 02/16/2017   Procedure: OPEN REDUCTION INTERNAL FIXATION (ORIF) LEFT PROXIMAL HUMERUS FRACTURE;  Surgeon: Meredith Pel, MD;  Location: [place];  Service: Orthopedics;  Laterality: Left;    Allergies: Allergies  Allergen Reactions  . Sulfa Antibiotics   . Codeine Nausea And Vomiting  . Dilaudid [Hydromorphone Hcl] Nausea And Vomiting    Zofran helped      Home Medications:  Current Outpatient Medications:  .  cholecalciferol (VITAMIN D) 400 units TABS tablet, Take 400 Units by mouth daily., Disp: , Rfl:  .  Cyanocobalamin (VITAMIN B-12) 500 MCG SUBL, Place under the tongue daily. , Disp: , Rfl:  .  diazepam (VALIUM) 5 MG  tablet, Take 2.5 mg by mouth at bedtime as needed (sleep). , Disp: , Rfl:  .  esomeprazole (NEXIUM) 40 MG capsule, TAKE 1 CAPSULE BY MOUTH DAILY AT 12 NOON, Disp: 90 capsule, Rfl: 3 .  Glatiramer Acetate (COPAXONE) 40 MG/ML SOSY, INJECT 40MG  SUBCUTANEOUSLY  THREE TIMES WEEKLY AT LEAST 48 HOURS APART ON MONDAY,  THURSDAY, AND SUNDAY  EVENING, Disp: 12 mL, Rfl: 5 .  ibuprofen (ADVIL,MOTRIN) 200 MG tablet, Take 600 mg by mouth every 8 (eight) hours as needed for moderate pain (for pain.)., Disp: , Rfl:  .  loperamide (IMODIUM) 2 MG capsule, Take 1 capsule (2 mg total) by mouth 4 (four) times daily as needed for diarrhea or loose stools., Disp: 12 capsule,  Rfl: 0 .  losartan (COZAAR) 50 MG tablet, Take 75 mg by mouth daily., Disp: , Rfl:  .  Probiotic Product (PROBIOTIC PO), Take 1 capsule by mouth daily., Disp: , Rfl:  .  Psyllium (METAMUCIL FIBER PO), Take by mouth 2 (two) times daily., Disp: , Rfl:  .  Vilazodone HCl (VIIBRYD) 10 MG TABS, TAKE 1 TABLET BY MOUTH DAILY OR AS DIRECTED BY DOCTOR, Disp: 90 tablet, Rfl: 3 .  amoxicillin-clavulanate (AUGMENTIN) 875-125 MG tablet, Take 1 tablet by mouth 2 (two) times daily. (Patient not taking: Reported on 11/22/2019), Disp: 14 tablet, Rfl: 0 .  ondansetron (ZOFRAN) 4 MG tablet, Take 1 tablet (4 mg total) by mouth every 8 (eight) hours as needed for nausea or vomiting. (Patient not taking: Reported on 11/22/2019), Disp: 10 tablet, Rfl: 0   Family History: family history includes Sick sinus syndrome in her father; Stomach cancer in her father.    Social History:   reports that she has been smoking cigarettes. She has a 22.50 pack-year smoking history. She has never used smokeless tobacco. She reports current alcohol use of about 8.0 standard drinks of alcohol per week. She reports that she does not use drugs.   Review of Systems: Constitutional: Denies weight loss/weight gain  Eyes: No changes in vision. ENT: No oral lesions, sore throat.  GI: see HPI.  Heme/Lymph: No easy bruising.  CV: No chest pain.  GU: No hematuria.  Integumentary: No rashes.  Neuro: No headaches.  Psych: No depression/anxiety.  Endocrine: No heat/cold intolerance.  Allergic/Immunologic: No urticaria.  Resp: No cough, SOB.  Musculoskeletal: No joint swelling.    Physical Examination: BP 128/83 (BP Location: Left Arm, Patient Position: Sitting, Cuff Size: Normal)   Pulse 69   Temp (!) 96.8 F (36 C) (Temporal)   Ht 5' 5.5" (1.664 m)   Wt 135 lb 8 oz (61.5 kg)   BMI 22.21 kg/m  Gen: NAD, alert and oriented x 4 HEENT: PEERLA, EOMI, Neck: supple, no JVD Chest: CTA bilaterally, no wheezes, crackles, or other  adventitious sounds CV: RRR, no m/g/c/r Abd: soft, NT, ND, +BS in all four quadrants; no HSM, guarding, ridigity, or rebound tenderness Ext: no edema, well perfused with 2+ pulses, Skin: no rash or lesions noted on observed skin Lymph: no noted LAD  Data Reviewed:  11/14/19-hemoglobin 16.6, otherwise normal CMP w. Na 128, K+ 3.4, AST 43 (per patient sodium was repeated at her primary and improved to 133 on 11/18/19)     11/14/19- CT a/p - IMPRESSION: 1. Severe descending and sigmoid diverticulosis without evidence of acute diverticulitis.  2. The colon is fluid-filled to the rectum, in keeping with diarrheal illness.  3. There is a 1.0 cm fluid attenuation lesion of the pancreatic uncinate,  which appears new in comparison to remote prior examination dated 07/27/2009. Findings are most consistent with a small IPMN. Recommend initial follow-up multiphasic contrast enhanced pancreatic protocol MRI at 1 year to establish initial stability. This recommendation follows ACR consensus guidelines: Management of Incidental Pancreatic Cysts: A White Paper of the ACR Incidental Findings Committee. J Am Coll Radiol Q4852182.  4.  Aortic Atherosclerosis (ICD10-I70.    Assessment/Plan: Ms. Summa is a 64 y.o. female    Sondi was seen today for new patient (initial visit).  Diagnoses and all orders for this visit:  Chronic GERD  Diverticulitis of colon    1. Diverticulitis/hx polyps - had CT on day 7 of antibiotics, pain has resolved at this time. Non tender on exam today. She is due for routine colonoscopy for polyp history and will wait 6 weeks after ABX completion to schedule. She long term takes metamucil.   2. Diarrhea - x 36 hours, to notify me if continues as has been on cipro/flagyl recently and would want to complete stool studies to exclude C diff.   3. GERD - well controlled on PRN nexium no UGI alarm symptoms.   Patient denies CP, SOB, and use of blood thinners. I  discussed the risks and benefits of procedure including bleeding, perforation, infection, missed lesions, medication reactions and possible hospitalization or surgery if complications. All questions answered.    I personally performed the service, non-incident to. (WP)  Laurine Blazer, [facility]

## 2019-11-22 NOTE — Patient Instructions (Addendum)
Please call if diarrhea worsens or still needing imodium to prevent diarrhea and will get stool studies We will schedule colonoscopy in about 6 weeks

## 2019-11-23 ENCOUNTER — Encounter: Payer: Self-pay | Admitting: Family Medicine

## 2019-11-23 ENCOUNTER — Ambulatory Visit: Payer: Medicare PPO | Admitting: Family Medicine

## 2019-11-23 VITALS — BP 123/83 | HR 72 | Wt 136.0 lb

## 2019-11-23 DIAGNOSIS — F419 Anxiety disorder, unspecified: Secondary | ICD-10-CM

## 2019-11-23 DIAGNOSIS — F329 Major depressive disorder, single episode, unspecified: Secondary | ICD-10-CM | POA: Diagnosis not present

## 2019-11-23 DIAGNOSIS — K2100 Gastro-esophageal reflux disease with esophagitis, without bleeding: Secondary | ICD-10-CM

## 2019-11-23 DIAGNOSIS — G35 Multiple sclerosis: Secondary | ICD-10-CM | POA: Diagnosis not present

## 2019-11-23 DIAGNOSIS — R42 Dizziness and giddiness: Secondary | ICD-10-CM

## 2019-11-23 DIAGNOSIS — F32A Depression, unspecified: Secondary | ICD-10-CM

## 2019-11-23 NOTE — Progress Notes (Signed)
PATIENT: [name] DOB: 1955-07-17  REASON FOR VISIT: follow up HISTORY FROM: patient  Chief Complaint  Patient presents with  . Follow-up    Pt here for  f/u on MS. Pt is having no new sx.     HISTORY OF PRESENT ILLNESS: Today 11/23/19 [name] is a 64 y.o. female here today for follow up for MS. She continues Capaxone injections as prescribed. She denies new or worsening symptoms. No exacerbating symptoms. She continues to have lower extremity weakness, L> [name] but reports this is unchanged. No changes in gait. No falls. No assistive devices. She has intermittent vertigo symptoms but reports this has been unchanged for years. She continues Viibryd for mood stabilization and feels it works very well. She does continue Nexium most days. She has not take it in the past two weeks. She was seen by the ER on 5/24 for diverticulitis. NA 128 and K 3.4. Creatinine normal. She has been seen by PCP and GI since and labs are now normal according to patient. She is feeling well today and without complaints.   HISTORY: (copied from my note on 01/11/2019)  [name] is a 64 y.o. female here today for follow up for MS. she reports that she is doing very well.  She continues Copaxone on Monday, Thursday and Sundays.  She is tolerating medication well.  She denies any new symptoms, specifically numbness, changes in vision, changes in gait or changes in bowel or bladder function.  She does state that heat makes her feel very tired.  She is trying to avoid being out in the heat for prolonged period of time.  She does continue Nexium nearly daily for acid reflux.  She is also taking Viibryd 10 mg daily for anxiety.  These medications have previously been refilled by Dr. [name].  She has been advised to avoid daily use of Nexium in the past.  Last MRI in April 2019 was stable.   History (copied from Dr Dohmeier's note on 09/08/2017)  HPI:Joan S Cookeis a 64 y.o.female, who was originally seen  here as a referral from Dr. Andris Flurry at the offices of Dr. Hilma Favors, MD for a transfer of care for multiple sclerosis.  [name] reports that she received the diagnosis of multiple sclerosis at age 39. She was diagnosed by Dr. Arsenio Katz, after years of having symptoms that included skin dysesthesias , numbness and right eye vision loss or blurring of vision- Soon afterwards she was seen by Dr. Dellis Filbert ,a multiple sclerosis specialist and followed him from the [facility] office to the [facility] office. She is now looking for followup care , also within the network of her other physicians.  Original diagnosis was established by clinical history, abnormal brain MRI and a spinal tap positive for oligoclonal bands. A copy of one of her last MRI is is available to me. Her brain MRI with and without contrast was compared to a study from 2012 and documented only minimal Disease progression. The patient had mild ventricular enlargement unchanged for the last 3 years. Multiple areas of increased white matter signal in the periventricular space, significant involvement of the corpus callosum and classic Dawson's fingers. Involvement of white matter demyelination in the temporal area. Dr. Starleen Blue interpreted the study as moderate to severe disease burden , referenced to her long-standing multiple sclerosis./ The patient's most recent blood test results were also attached ( referral papers)- she has a normal white blood cell count she's not anemic, there  is normal kidney function ,normal liver function and she is not diabetic. She's currently controlled on Copaxone with 40 MG 3 TIMES A WEEK THE PATIENT SWITCHED LAST SUMMER FROM daily to the 3 Times Weekly Formulation.  She has Relapsing Remitting Multiple Sclerosis , she has noticed neither clinical changes nor side effects since switching in the formulation.    06-20-14 [name] has been stable neurologically and overall since our last visit. She has changed  to the 3 times weekly form of Copaxone without any problems. She already received her pneumonia shot and her flu shot for this year. She resides in [place] and the community has been suffering under a viral infection wave this winter with the respiratory with respiratory symptoms as well as myalgia. She has so f/ar done very well. She has no recent blood test to review. She moved to a one level apartment , first floor and is happy with the new neighborhood. She lives alone with a cat.   01-03-2015. [name] is here for her regular routine and refill visit. She had no evidence of any relapses she remains hypertensive height per recent flexible sigmoidoscopy but was normal Babinski responses and she has some proximal muscle rigidity but no cogwheeling associated with it all this speaks for a mild progressive form of MS for progression has been very slow and she has been responding well to the medications. This is her 57 year anniversary of the diagnosis of MS.  01-02-2016, Joan. [place] has not felt any impairment, no relapse or remitting symptoms of MS. We will repeat a brain MRI. Her original diagnosis was established by clinical history abnormal brain MRI and oligoclonal bands in her cerebral spinal fluid. We discussed today when she can have her MRI and she would like it in November or even December of this year at [place] location. She would like an open MRI.  Interval history from 01/06/2017 for this established 64 year old Caucasian patient [name], who carries a diagnosis of multiple sclerosis. Heat and humidity affect her. The patient is currently treated with Copaxone 40 mg 3 times a week and has had no evidence of a relapse since. She has been stable for the last 4 years. Her diagnosis was established on 7-15- 2006. She drives, livesindependent andalone and is able to perform all activities of daily living, handles her own finances.I like for her to have a new MRI - she  would like to have it at [place].  09-08-2017, [name] for this established patient with MS. Now due for MRI - brain and c spine. She insists on open MRI . She fell while brooming off cobwebs from the ceiling and broke her left humerus. Went to ED 02-06-2017 , surgery 02-16-2017. She now has a metal plate.    REVIEW OF SYSTEMS: Out of a complete 14 system review of symptoms, the patient complains only of the following symptoms, diverticulitis, GERD, anxiety, and all other reviewed systems are negative.  ALLERGIES: Allergies  Allergen Reactions  . Sulfa Antibiotics   . Codeine Nausea And Vomiting  . Dilaudid [Hydromorphone Hcl] Nausea And Vomiting    Zofran helped    HOME MEDICATIONS: Outpatient Medications Prior to Visit  Medication Sig Dispense Refill  . cholecalciferol (VITAMIN D) 400 units TABS tablet Take 400 Units by mouth daily.    . Cyanocobalamin (VITAMIN B-12) 500 MCG SUBL Place under the tongue daily.     . diazepam (VALIUM) 5 MG tablet Take 2.5 mg by mouth  at bedtime as needed (sleep).     Marland Kitchen esomeprazole (NEXIUM) 40 MG capsule TAKE 1 CAPSULE BY MOUTH DAILY AT 12 NOON 90 capsule 3  . Glatiramer Acetate (COPAXONE) 40 MG/ML SOSY INJECT 40MG  SUBCUTANEOUSLY  THREE TIMES WEEKLY AT LEAST 48 HOURS APART ON MONDAY,  THURSDAY, AND SUNDAY  EVENING 12 mL 5  . ibuprofen (ADVIL,MOTRIN) 200 MG tablet Take 600 mg by mouth every 8 (eight) hours as needed for moderate pain (for pain.).    Marland Kitchen loperamide (IMODIUM) 2 MG capsule Take 1 capsule (2 mg total) by mouth 4 (four) times daily as needed for diarrhea or loose stools. 12 capsule 0  . losartan (COZAAR) 50 MG tablet Take 75 mg by mouth daily.    . ondansetron (ZOFRAN) 4 MG tablet Take 1 tablet (4 mg total) by mouth every 8 (eight) hours as needed for nausea or vomiting. 10 tablet 0  . Probiotic Product (PROBIOTIC PO) Take 1 capsule by mouth daily.    . Psyllium (METAMUCIL FIBER PO) Take by mouth 2 (two) times daily.    . Vilazodone HCl (VIIBRYD)  10 MG TABS TAKE 1 TABLET BY MOUTH DAILY OR AS DIRECTED BY DOCTOR 90 tablet 3  . amoxicillin-clavulanate (AUGMENTIN) 875-125 MG tablet Take 1 tablet by mouth 2 (two) times daily. (Patient not taking: Reported on 11/22/2019) 14 tablet 0   No facility-administered medications prior to visit.    PAST MEDICAL HISTORY: Past Medical History:  Diagnosis Date  . Arthritis    Osteoarthritis  . Colon polyps   . GERD (gastroesophageal reflux disease)   . Hiatal hernia   . Hypertension   . MS (multiple sclerosis) ([place]) 12/09/2013  . Multiple sclerosis ([place])   . Seizures ([place]) 01/04/2005   1 and Only    PAST SURGICAL HISTORY: Past Surgical History:  Procedure Laterality Date  . COLONOSCOPY    . COLONOSCOPY N/A 09/16/2012   Procedure: COLONOSCOPY;  Surgeon: Rogene Houston, MD;  Location[facility];  Service: Endoscopy;  Laterality: N/A;  1030-rescheduled to 9:30 Ann notified pt  . DILATION AND CURETTAGE OF UTERUS    . ORIF HUMERUS FRACTURE Left 02/16/2017   Procedure: OPEN REDUCTION INTERNAL FIXATION (ORIF) LEFT PROXIMAL HUMERUS FRACTURE;  Surgeon: Meredith Pel, MD;  Location: [facility];  Service: Orthopedics;  Laterality: Left;    FAMILY HISTORY: Family History  Problem Relation Age of Onset  . Stomach cancer Father   . Sick sinus syndrome Father   . Colon cancer Neg Hx     SOCIAL HISTORY: Social History   Socioeconomic History  . Marital status: Single    Spouse name: Not on file  . Number of children: 0  . Years of education: College  . Highest education level: Not on file  Occupational History    Employer: DISABLED  Tobacco Use  . Smoking status: Current Every Day Smoker    Packs/day: 0.75    Years: 30.00    Pack years: 22.50    Types: Cigarettes  . Smokeless tobacco: Never Used  Substance and Sexual Activity  . Alcohol use: Yes    Alcohol/week: 8.0 standard drinks    Types: 8 Cans of beer per week    Comment: 4-5 beers daily   . Drug use: No  . Sexual  activity: Not on file  Other Topics Concern  . Not on file  Social History Narrative   Patient is single and lives alone.   Patient has a college education   Patient  is right-handed.   Patient is retired.   Patient drinks three cups of coffee daily.   Social Determinants of Health   Financial Resource Strain:   . Difficulty of Paying Living Expenses:   Food Insecurity:   . Worried About Charity fundraiser in the Last Year:   . Arboriculturist in the Last Year:   Transportation Needs:   . Film/video editor (Medical):   Marland Kitchen Lack of Transportation (Non-Medical):   Physical Activity:   . Days of Exercise per Week:   . Minutes of Exercise per Session:   Stress:   . Feeling of Stress :   Social Connections:   . Frequency of Communication with Friends and Family:   . Frequency of Social Gatherings with Friends and Family:   . Attends Religious Services:   . Active Member of Clubs or Organizations:   . Attends Archivist Meetings:   Marland Kitchen Marital Status:   Intimate Partner Violence:   . Fear of Current or Ex-Partner:   . Emotionally Abused:   Marland Kitchen Physically Abused:   . Sexually Abused:       PHYSICAL EXAM  Vitals:   11/23/19 1100  BP: 123/83  Pulse: 72  Weight: 136 lb (61.7 kg)   Body mass index is 22.29 kg/m.  Generalized: Well developed, in no acute distress  Cardiology: normal rate and rhythm, no murmur noted Respiratory: clear to auscultation bilaterally  Neurological examination  Mentation: Alert oriented to time, place, history taking. Follows all commands speech and language fluent Cranial nerve II-XII: Pupils were equal round reactive to light. Extraocular movements were full, visual field were full on confrontational test. Facial sensation and strength were normal. Uvula tongue midline. Head turning and shoulder shrug  were normal and symmetric. Motor: The motor testing reveals 5 over 5 strength of bilateral upper extremities, 4+/5 left hip flexion,  5/5 right lower. Good symmetric motor tone is noted throughout.  Sensory: Sensory testing is intact to soft touch on all 4 extremities. No evidence of extinction is noted.  Coordination: Cerebellar testing reveals good finger-nose-finger and heel-to-shin bilaterally.  Gait and station: Gait is normal. Tandem gait is stable.  Reflexes: Deep tendon reflexes are symmetric and normal bilaterally of upper extremities, brisk but symmetric in bilateral lower pattellar and suprapatellar.   DIAGNOSTIC DATA (LABS, IMAGING, TESTING) - I reviewed patient records, labs, notes, testing and imaging myself where available.  No flowsheet data found.   Lab Results  Component Value Date   WBC 7.6 11/14/2019   HGB 16.6 (H) 11/14/2019   HCT 49.0 (H) 11/14/2019   MCV 98.0 11/14/2019   PLT 257 11/14/2019      Component Value Date/Time   NA 128 (L) 11/14/2019 1214   NA 142 09/08/2017 1255   K 3.4 (L) 11/14/2019 1214   CL 91 (L) 11/14/2019 1214   CO2 25 11/14/2019 1214   GLUCOSE 101 (H) 11/14/2019 1214   BUN 5 (L) 11/14/2019 1214   BUN 9 09/08/2017 1255   CREATININE 0.68 11/14/2019 1214   CALCIUM 9.7 11/14/2019 1214   PROT 7.7 11/14/2019 1214   PROT 7.1 09/08/2017 1255   ALBUMIN 4.9 11/14/2019 1214   ALBUMIN 4.6 09/08/2017 1255   AST 43 (H) 11/14/2019 1214   ALT 44 11/14/2019 1214   ALKPHOS 64 11/14/2019 1214   BILITOT 0.8 11/14/2019 1214   BILITOT 0.5 09/08/2017 1255   GFRNONAA >60 11/14/2019 1214   GFRAA >60 11/14/2019 1214   No  results found for: CHOL, HDL, LDLCALC, LDLDIRECT, TRIG, CHOLHDL No results found for: HGBA1C No results found for: VITAMINB12 No results found for: TSH     ASSESSMENT AND PLAN 64 y.o. year old female  has a past medical history of Arthritis, Colon polyps, GERD (gastroesophageal reflux disease), Hiatal hernia, Hypertension, MS (multiple sclerosis) ([place]) (12/09/2013), Multiple sclerosis ([place]), and Seizures ([place]) (01/04/2005). here with     ICD-10-CM   1. Multiple  sclerosis ([place])  G35   2. Gastroesophageal reflux disease with esophagitis without hemorrhage  K21.00   3. Dizziness  R42   4. Anxiety and depression  F41.9    F32.9     Elis is doing well today.  She denies any new or worsening symptoms.  We will continue Copaxone injections as prescribed.  She will continue Viibryd daily as this is helping with anxiety and depression.  We have discussed safety concerns regarding chronic use of PPIs.  I have advised that she try to wean Nexium and use only as needed.  Fortunately, she has been eating a different diet due to diverticulitis flare.  I am hopeful that this will decrease her need for daily PPI.  She will also discuss this with her primary care provider.  I have reviewed labs from her recent ER visit for today's visit.  We discussed updating her MRI.  It was stable in 2019.  She wishes to hold off on repeat imaging today as she anticipates having a colonoscopy in the upcoming weeks.  She will call me with any new or worsening symptoms.  We will anticipate updating MRI at next follow-up.  She will see me back in 6 to 12 months.  She verbalizes understanding and agreement with this plan.   No orders of the defined types were placed in this encounter.    No orders of the defined types were placed in this encounter.     I spent 15 minutes with the patient. 50% of this time was spent counseling and educating patient on plan of care and medications.    Debbora Presto, FNP-C 11/23/2019, 1:03 PM [facility] [address], [place],  [address] [phone]

## 2019-11-23 NOTE — Progress Notes (Deleted)
PATIENT: [name] DOB: [birth year]  REASON FOR VISIT: follow up HISTORY FROM: patient  No chief complaint on file.    HISTORY OF PRESENT ILLNESS: Today 11/23/19 [name] is a 64 y.o. female here today for follow up.   HISTORY: (copied from my note on 01/11/2019)  [name] is a 64 y.o. female here today for follow up for MS. she reports that she is doing very well.  She continues Copaxone on Monday, Thursday and Sundays.  She is tolerating medication well.  She denies any new symptoms, specifically numbness, changes in vision, changes in gait or changes in bowel or bladder function.  She does state that heat makes her feel very tired.  She is trying to avoid being out in the heat for prolonged period of time.  She does continue Nexium nearly daily for acid reflux.  She is also taking Viibryd 10 mg daily for anxiety.  These medications have previously been refilled by Dr. [name].  She has been advised to avoid daily use of Nexium in the past.  Last MRI in April 2019 was stable.  History (copied from Dr Dohmeier's note on 09/08/2017)  HPI:[name] a 64 y.o.female, who was originally seen here as a referral from Dr. Andris Flurry at the offices of Dr. Hilma Favors, MD for a transfer of care for multiple sclerosis.  Mrs Veerkamp reports that she received the diagnosis of multiple sclerosis at age 25. She was diagnosed by Dr. Arsenio Katz, after years of having symptoms that included skin dysesthesias , numbness and right eye vision loss or blurring of vision- Soon afterwards she was seen by Dr. Dellis Filbert ,a multiple sclerosis specialist and followed him from the [facility] office to the [facility] office. She is now looking for followup care , also within the network of her other physicians.  Original diagnosis was established by clinical history, abnormal brain MRI and a spinal tap positive for oligoclonal bands. A copy of one of her last MRI is is available to me. Her brain MRI with and  without contrast was compared to a study from 2012 and documented only minimal Disease progression. The patient had mild ventricular enlargement unchanged for the last 3 years. Multiple areas of increased white matter signal in the periventricular space, significant involvement of the corpus callosum and classic Dawson's fingers. Involvement of white matter demyelination in the temporal area. Dr. Starleen Blue interpreted the study as moderate to severe disease burden , referenced to her long-standing multiple sclerosis./ The patient's most recent blood test results were also attached ( referral papers)- she has a normal white blood cell count she's not anemic, there is normal kidney function ,normal liver function and she is not diabetic. She's currently controlled on Copaxone with 40 MG 3 TIMES A WEEK THE PATIENT SWITCHED LAST SUMMER FROM daily to the 3 Times Weekly Formulation.  She has Relapsing Remitting Multiple Sclerosis , she has noticed neither clinical changes nor side effects since switching in the formulation.    06-20-14 [name] has been stable neurologically and overall since our last visit. She has changed to the 3 times weekly form of Copaxone without any problems. She already received her pneumonia shot and her flu shot for this year. She resides in [place] and the community has been suffering under a viral infection wave this winter with the respiratory with respiratory symptoms as well as myalgia. She has so f/ar done very well. She has no recent blood test to review. She  moved to a one level apartment , first floor and is happy with the new neighborhood. She lives alone with a cat.   01-03-2015. [name] is here for her regular routine and refill visit. She had no evidence of any relapses she remains hypertensive height per recent flexible sigmoidoscopy but was normal Babinski responses and she has some proximal muscle rigidity but no cogwheeling associated with it all this  speaks for a mild progressive form of MS for progression has been very slow and she has been responding well to the medications. This is her 70 year anniversary of the diagnosis of MS.  01-02-2016, [name] has not felt any impairment, no relapse or remitting symptoms of MS. We will repeat a brain MRI. Her original diagnosis was established by clinical history abnormal brain MRI and oligoclonal bands in her cerebral spinal fluid. We discussed today when she can have her MRI and she would like it in November or even December of this year at [place] location. She would like an open MRI.  Interval history from 01/06/2017 for this established 64 year old Caucasian patient [name], who carries a diagnosis of multiple sclerosis. Heat and humidity affect her. The patient is currently treated with Copaxone 40 mg 3 times a week and has had no evidence of a relapse since. She has been stable for the last 4 years. Her diagnosis was established on 7-15- 2006. She drives, livesindependent andalone and is able to perform all activities of daily living, handles her own finances.I like for her to have a new MRI - she would like to have it at [place].  09-08-2017, R for this established patient with MS. Now due for MRI - brain and c spine. She insists on open MRI . She fell while brooming off cobwebs from the ceiling and broke her left humerus. Went to ED 02-06-2017 , surgery 02-16-2017. She now has a metal plate.    REVIEW OF SYSTEMS: Out of a complete 14 system review of symptoms, the patient complains only of the following symptoms, and all other reviewed systems are negative.  ALLERGIES: Allergies  Allergen Reactions  . Sulfa Antibiotics   . Codeine Nausea And Vomiting  . Dilaudid [Hydromorphone Hcl] Nausea And Vomiting    Zofran helped    HOME MEDICATIONS: Outpatient Medications Prior to Visit  Medication Sig Dispense Refill  . amoxicillin-clavulanate (AUGMENTIN) 875-125 MG  tablet Take 1 tablet by mouth 2 (two) times daily. (Patient not taking: Reported on 11/22/2019) 14 tablet 0  . cholecalciferol (VITAMIN D) 400 units TABS tablet Take 400 Units by mouth daily.    . Cyanocobalamin (VITAMIN B-12) 500 MCG SUBL Place under the tongue daily.     . diazepam (VALIUM) 5 MG tablet Take 2.5 mg by mouth at bedtime as needed (sleep).     Marland Kitchen esomeprazole (NEXIUM) 40 MG capsule TAKE 1 CAPSULE BY MOUTH DAILY AT 12 NOON 90 capsule 3  . Glatiramer Acetate (COPAXONE) 40 MG/ML SOSY INJECT 40MG  SUBCUTANEOUSLY  THREE TIMES WEEKLY AT LEAST 48 HOURS APART ON MONDAY,  THURSDAY, AND SUNDAY  EVENING 12 mL 5  . ibuprofen (ADVIL,MOTRIN) 200 MG tablet Take 600 mg by mouth every 8 (eight) hours as needed for moderate pain (for pain.).    Marland Kitchen loperamide (IMODIUM) 2 MG capsule Take 1 capsule (2 mg total) by mouth 4 (four) times daily as needed for diarrhea or loose stools. 12 capsule 0  . losartan (COZAAR) 50 MG tablet Take 75 mg by  mouth daily.    . ondansetron (ZOFRAN) 4 MG tablet Take 1 tablet (4 mg total) by mouth every 8 (eight) hours as needed for nausea or vomiting. (Patient not taking: Reported on 11/22/2019) 10 tablet 0  . Probiotic Product (PROBIOTIC PO) Take 1 capsule by mouth daily.    . Psyllium (METAMUCIL FIBER PO) Take by mouth 2 (two) times daily.    . Vilazodone HCl (VIIBRYD) 10 MG TABS TAKE 1 TABLET BY MOUTH DAILY OR AS DIRECTED BY DOCTOR 90 tablet 3   No facility-administered medications prior to visit.    PAST MEDICAL HISTORY: Past Medical History:  Diagnosis Date  . Arthritis    Osteoarthritis  . Colon polyps   . GERD (gastroesophageal reflux disease)   . Hiatal hernia   . Hypertension   . MS (multiple sclerosis) ([place]) 12/09/2013  . Multiple sclerosis ([place])   . Seizures ([place]) 01/04/2005   1 and Only    PAST SURGICAL HISTORY: Past Surgical History:  Procedure Laterality Date  . COLONOSCOPY    . COLONOSCOPY N/A 09/16/2012   Procedure: COLONOSCOPY;  Surgeon: Rogene Houston, MD;  Location[facility];  Service: Endoscopy;  Laterality: N/A;  1030-rescheduled to 9:30 Ann notified pt  . DILATION AND CURETTAGE OF UTERUS    . ORIF HUMERUS FRACTURE Left 02/16/2017   Procedure: OPEN REDUCTION INTERNAL FIXATION (ORIF) LEFT PROXIMAL HUMERUS FRACTURE;  Surgeon: Meredith Pel, MD;  Location: [facility];  Service: Orthopedics;  Laterality: Left;    FAMILY HISTORY: Family History  Problem Relation Age of Onset  . Stomach cancer Father   . Sick sinus syndrome Father   . Colon cancer Neg Hx     SOCIAL HISTORY: Social History   Socioeconomic History  . Marital status: Single    Spouse name: Not on file  . Number of children: 0  . Years of education: College  . Highest education level: Not on file  Occupational History    Employer: DISABLED  Tobacco Use  . Smoking status: Current Every Day Smoker    Packs/day: 0.75    Years: 30.00    Pack years: 22.50    Types: Cigarettes  . Smokeless tobacco: Never Used  Substance and Sexual Activity  . Alcohol use: Yes    Alcohol/week: 8.0 standard drinks    Types: 8 Cans of beer per week    Comment: 4-5 beers daily   . Drug use: No  . Sexual activity: Not on file  Other Topics Concern  . Not on file  Social History Narrative   Patient is single and lives alone.   Patient has a college education   Patient is right-handed.   Patient is retired.   Patient drinks three cups of coffee daily.   Social Determinants of Health   Financial Resource Strain:   . Difficulty of Paying Living Expenses:   Food Insecurity:   . Worried About Charity fundraiser in the Last Year:   . Arboriculturist in the Last Year:   Transportation Needs:   . Film/video editor (Medical):   Marland Kitchen Lack of Transportation (Non-Medical):   Physical Activity:   . Days of Exercise per Week:   . Minutes of Exercise per Session:   Stress:   . Feeling of Stress :   Social Connections:   . Frequency of Communication with Friends and  Family:   . Frequency of Social Gatherings with Friends and Family:   . Attends Religious Services:   .  Active Member of Clubs or Organizations:   . Attends Archivist Meetings:   Marland Kitchen Marital Status:   Intimate Partner Violence:   . Fear of Current or Ex-Partner:   . Emotionally Abused:   Marland Kitchen Physically Abused:   . Sexually Abused:       PHYSICAL EXAM  There were no vitals filed for this visit. There is no height or weight on file to calculate BMI.  Generalized: Well developed, in no acute distress  Cardiology: normal rate and rhythm, no murmur noted Respiratory: clear to auscultation bilaterally  Neurological examination  Mentation: Alert oriented to time, place, history taking. Follows all commands speech and language fluent Cranial nerve II-XII: Pupils were equal round reactive to light. Extraocular movements were full, visual field were full on confrontational test. Facial sensation and strength were normal. Uvula tongue midline. Head turning and shoulder shrug  were normal and symmetric. Motor: The motor testing reveals 5 over 5 strength of all 4 extremities. Good symmetric motor tone is noted throughout.  Sensory: Sensory testing is intact to soft touch on all 4 extremities. No evidence of extinction is noted.  Coordination: Cerebellar testing reveals good finger-nose-finger and heel-to-shin bilaterally.  Gait and station: Gait is normal. Tandem gait is normal. Romberg is negative. No drift is seen.  Reflexes: Deep tendon reflexes are symmetric and normal bilaterally.   DIAGNOSTIC DATA (LABS, IMAGING, TESTING) - I reviewed patient records, labs, notes, testing and imaging myself where available.  No flowsheet data found.   Lab Results  Component Value Date   WBC 7.6 11/14/2019   HGB 16.6 (H) 11/14/2019   HCT 49.0 (H) 11/14/2019   MCV 98.0 11/14/2019   PLT 257 11/14/2019      Component Value Date/Time   NA 128 (L) 11/14/2019 1214   NA 142 09/08/2017 1255    K 3.4 (L) 11/14/2019 1214   CL 91 (L) 11/14/2019 1214   CO2 25 11/14/2019 1214   GLUCOSE 101 (H) 11/14/2019 1214   BUN 5 (L) 11/14/2019 1214   BUN 9 09/08/2017 1255   CREATININE 0.68 11/14/2019 1214   CALCIUM 9.7 11/14/2019 1214   PROT 7.7 11/14/2019 1214   PROT 7.1 09/08/2017 1255   ALBUMIN 4.9 11/14/2019 1214   ALBUMIN 4.6 09/08/2017 1255   AST 43 (H) 11/14/2019 1214   ALT 44 11/14/2019 1214   ALKPHOS 64 11/14/2019 1214   BILITOT 0.8 11/14/2019 1214   BILITOT 0.5 09/08/2017 1255   GFRNONAA >60 11/14/2019 1214   GFRAA >60 11/14/2019 1214   No results found for: CHOL, HDL, LDLCALC, LDLDIRECT, TRIG, CHOLHDL No results found for: HGBA1C No results found for: VITAMINB12 No results found for: TSH     ASSESSMENT AND PLAN 64 y.o. year old female  has a past medical history of Arthritis, Colon polyps, GERD (gastroesophageal reflux disease), Hiatal hernia, Hypertension, MS (multiple sclerosis) ([place]) (12/09/2013), Multiple sclerosis ([place]), and Seizures ([place]) (01/04/2005). here with ***  No diagnosis found.     No orders of the defined types were placed in this encounter.    No orders of the defined types were placed in this encounter.     I spent 15 minutes with the patient. 50% of this time was spent counseling and educating patient on plan of care and medications.    Debbora Presto, FNP-C 11/23/2019, 8:12 AM [facility] [address] [place], [place] [address] [phone]

## 2019-11-23 NOTE — Patient Instructions (Addendum)
Continue Capaxone as prescribed. Continue Viibyrd daily. Try to avoid daily use of Nexium. Wean as tolerated. Speak with GI about concerns of continuing long term PPI as discussed in the office.   Stay active. Stay well hydrated. We will update MRI with next follow up as discussed.   Follow up with me in 6-12 months    Multiple Sclerosis Multiple sclerosis (MS) is a disease of the brain, spinal cord, and optic nerves (central nervous system). It causes the body's disease-fighting (immune) system to destroy the protective covering (myelin sheath) around nerves in the brain. When this happens, signals (nerve impulses) going to and from the brain and spinal cord do not get sent properly or may not get sent at all. There are several types of MS:  Relapsing-remitting MS. This is the most common type. This causes sudden attacks of symptoms. After an attack, you may recover completely until the next attack, or some symptoms may remain permanently.  Secondary progressive MS. This usually develops after the onset of relapsing-remitting MS. Similar to relapsing-remitting MS, this type also causes sudden attacks of symptoms. Attacks may be less frequent, but symptoms slowly get worse (progress) over time.  Primary progressive MS. This causes symptoms that steadily progress over time. This type of MS does not cause sudden attacks of symptoms. The age of onset of MS varies, but it often develops between 67-40 years of age. MS is a lifelong (chronic) condition. There is no cure, but treatment can help slow down the progression of the disease. What are the causes? The cause of this condition is not known. What increases the risk? You are more likely to develop this condition if:  You are a woman.  You have a relative with MS. However, the condition is not passed from parent to child (inherited).  You have a lack (deficiency) of vitamin D.  You smoke. MS is more common in the [place] than  in the [place]. What are the signs or symptoms? Relapsing-remitting and secondary progressive MS cause symptoms to occur in episodes or attacks that may last weeks to months. There may be long periods between attacks in which there are almost no symptoms. Primary progressive MS causes symptoms to steadily progress after they develop. Symptoms of MS vary because of the many different ways it affects the central nervous system. The main symptoms include:  Vision problems and eye pain.  Numbness.  Weakness.  Inability to move your arms, hands, feet, or legs (paralysis).  Balance problems.  Shaking that you cannot control (tremors).  Muscle spasms.  Problems with thinking (cognitive changes). MS can also cause symptoms that are associated with the disease, but are not always the direct result of an MS attack. They may include:  Inability to control urination or bowel movements (incontinence).  Headaches.  Fatigue.  Inability to tolerate heat.  Emotional changes.  Depression.  Pain. How is this diagnosed? This condition is diagnosed based on:  Your symptoms.  A neurological exam. This involves checking central nervous system function, such as nerve function, reflexes, and coordination.  MRIs of the brain and spinal cord.  Lab tests, including a lumbar puncture that tests the fluid that surrounds the brain and spinal cord (cerebrospinal fluid).  Tests to measure the electrical activity of the brain in response to stimulation (evoked potentials). How is this treated? There is no cure for MS, but medicines can help decrease the number and frequency of attacks and help relieve nuisance symptoms. Treatment  options may include:  Medicines that reduce the frequency of attacks. These medicines may be given by injection, by mouth (orally), or through an IV.  Medicines that reduce inflammation (steroids). These may provide short-term relief of  symptoms.  Medicines to help control pain, depression, fatigue, or incontinence.  Vitamin D, if you have a deficiency.  Using devices to help you move around (assistive devices), such as braces, a cane, or a walker.  Physical therapy to strengthen and stretch your muscles.  Occupational therapy to help you with everyday tasks.  Alternative or complementary treatments such as exercise, massage, or acupuncture. Follow these instructions at home:  Take over-the-counter and prescription medicines only as told by your health care provider.  Do not drive or use heavy machinery while taking prescription pain medicine.  Use assistive devices as recommended by your physical therapist or your health care provider.  Exercise as directed by your health care provider.  Return to your normal activities as told by your health care provider. Ask your health care provider what activities are safe for you.  Reach out for support. Share your feelings with friends, family, or a support group.  Keep all follow-up visits as told by your health care provider and therapists. This is important. Where to find more information  National Multiple Sclerosis Society: https://www.nationalmssociety.org Contact a health care provider if:  You feel depressed.  You develop new pain or numbness.  You have tremors.  You have problems with sexual function. Get help right away if:  You develop paralysis.  You develop numbness.  You have problems with your bladder or bowel function.  You develop double vision.  You lose vision in one or both eyes.  You develop suicidal thoughts.  You develop severe confusion. If you ever feel like you may hurt yourself or others, or have thoughts about taking your own life, get help right away. You can go to your nearest emergency department or call:  Your local emergency services (911 in the [place]  A suicide crisis helpline, such as the [facility] at 908-417-5486. This is open 24 hours a day. Summary  Multiple sclerosis (MS) is a disease of the central nervous system that causes the body's immune system to destroy the protective covering (myelin sheath) around nerves in the brain.  There are 3 types of MS: relapsing-remitting, secondary progressive, and primary progressive. Relapsing-remitting and secondary progressive MS cause symptoms to occur in episodes or attacks that may last weeks to months. Primary progressive MS causes symptoms to steadily progress after they develop.  There is no cure for MS, but medicines can help decrease the number and frequency of attacks and help relieve nuisance symptoms. Treatment may also include physical or occupational therapy.  If you develop numbness, paralysis, vision problems, or other neurological symptoms, get help right away. This information is not intended to replace advice given to you by your health care provider. Make sure you discuss any questions you have with your health care provider. Document Revised: 05/22/2017 Document Reviewed: 08/18/2016 Elsevier Patient Education  [place].    Gastroesophageal Reflux Disease, Adult Gastroesophageal reflux (GER) happens when acid from the stomach flows up into the tube that connects the mouth and the stomach (esophagus). Normally, food travels down the esophagus and stays in the stomach to be digested. With GER, food and stomach acid sometimes move back up into the esophagus. You may have a disease called gastroesophageal reflux disease (GERD) if the reflux:  Happens  often.  Causes frequent or very bad symptoms.  Causes problems such as damage to the esophagus. When this happens, the esophagus becomes sore and swollen (inflamed). Over time, GERD can make small holes (ulcers) in the lining of the esophagus. What are the causes? This condition is caused by a problem with the muscle between the esophagus and the  stomach. When this muscle is weak or not normal, it does not close properly to keep food and acid from coming back up from the stomach. The muscle can be weak because of:  Tobacco use.  Pregnancy.  Having a certain type of hernia (hiatal hernia).  Alcohol use.  Certain foods and drinks, such as coffee, chocolate, onions, and peppermint. What increases the risk? You are more likely to develop this condition if you:  Are overweight.  Have a disease that affects your connective tissue.  Use NSAID medicines. What are the signs or symptoms? Symptoms of this condition include:  Heartburn.  Difficult or painful swallowing.  The feeling of having a lump in the throat.  A bitter taste in the mouth.  Bad breath.  Having a lot of saliva.  Having an upset or bloated stomach.  Belching.  Chest pain. Different conditions can cause chest pain. Make sure you see your doctor if you have chest pain.  Shortness of breath or noisy breathing (wheezing).  Ongoing (chronic) cough or a cough at night.  Wearing away of the surface of teeth (tooth enamel).  Weight loss. How is this treated? Treatment will depend on how bad your symptoms are. Your doctor may suggest:  Changes to your diet.  Medicine.  Surgery. Follow these instructions at home: Eating and drinking   Follow a diet as told by your doctor. You may need to avoid foods and drinks such as: ? Coffee and tea (with or without caffeine). ? Drinks that contain alcohol. ? Energy drinks and sports drinks. ? Bubbly (carbonated) drinks or sodas. ? Chocolate and cocoa. ? Peppermint and mint flavorings. ? Garlic and onions. ? Horseradish. ? Spicy and acidic foods. These include peppers, chili powder, curry powder, vinegar, hot sauces, and BBQ sauce. ? Citrus fruit juices and citrus fruits, such as oranges, lemons, and limes. ? Tomato-based foods. These include red sauce, chili, salsa, and pizza with red sauce. ? Fried and  fatty foods. These include donuts, french fries, potato chips, and high-fat dressings. ? High-fat meats. These include hot dogs, rib eye steak, sausage, ham, and bacon. ? High-fat dairy items, such as whole milk, butter, and cream cheese.  Eat small meals often. Avoid eating large meals.  Avoid drinking large amounts of liquid with your meals.  Avoid eating meals during the 2-3 hours before bedtime.  Avoid lying down right after you eat.  Do not exercise right after you eat. Lifestyle   Do not use any products that contain nicotine or tobacco. These include cigarettes, e-cigarettes, and chewing tobacco. If you need help quitting, ask your doctor.  Try to lower your stress. If you need help doing this, ask your doctor.  If you are overweight, lose an amount of weight that is healthy for you. Ask your doctor about a safe weight loss goal. General instructions  Pay attention to any changes in your symptoms.  Take over-the-counter and prescription medicines only as told by your doctor. Do not take aspirin, ibuprofen, or other NSAIDs unless your doctor says it is okay.  Wear loose clothes. Do not wear anything tight around your waist.  Raise (elevate) the head of your bed about 6 inches (15 cm).  Avoid bending over if this makes your symptoms worse.  Keep all follow-up visits as told by your doctor. This is important. Contact a doctor if:  You have new symptoms.  You lose weight and you do not know why.  You have trouble swallowing or it hurts to swallow.  You have wheezing or a cough that keeps happening.  Your symptoms do not get better with treatment.  You have a hoarse voice. Get help right away if:  You have pain in your arms, neck, jaw, teeth, or back.  You feel sweaty, dizzy, or light-headed.  You have chest pain or shortness of breath.  You throw up (vomit) and your throw-up looks like blood or coffee grounds.  You pass out (faint).  Your poop (stool) is  bloody or black.  You cannot swallow, drink, or eat. Summary  If a person has gastroesophageal reflux disease (GERD), food and stomach acid move back up into the esophagus and cause symptoms or problems such as damage to the esophagus.  Treatment will depend on how bad your symptoms are.  Follow a diet as told by your doctor.  Take all medicines only as told by your doctor. This information is not intended to replace advice given to you by your health care provider. Make sure you discuss any questions you have with your health care provider. Document Revised: 12/16/2017 Document Reviewed: 12/16/2017 Elsevier Patient Education  [place].    Esomeprazole capsules What is this medicine? ESOMEPRAZOLE (es oh ME pray zol) prevents the production of acid in the stomach. It is used to treat gastroesophageal reflux disease (GERD), ulcers, certain bacteria in the stomach, and inflammation of the esophagus. It can also be used to prevent ulcers in patients taking medicines called NSAIDs. You can also buy this medicine without a prescription to treat the symptoms of heartburn. The non-prescription product is not for long-term use, unless otherwise directed by your doctor or health care professional. This medicine may be used for other purposes; ask your health care provider or pharmacist if you have questions. COMMON BRAND NAME(S): Nexium, Nexium 24HR, Nexium 24HR Clear Minis What should I tell my health care provider before I take this medicine? They need to know if you have any of these conditions:  black or bloody stools  chest pain  difficulty swallowing  have had heartburn for over 3 months  have heartburn with dizziness, lightheadedness or sweating  liver disease  lupus  stomach pain  unexplained weight loss  vomiting with blood  wheezing  an unusual or allergic reaction to esomeprazole, other medicines, foods, dyes, or preservatives  pregnant or trying to get  pregnant  breast-feeding How should I use this medicine? Take this medicine by mouth with a glass of water. Follow the directions on the prescription label. Do not cut, crush or chew this medicine. Swallow the capsules whole. You may open the capsule and put the contents in 1 tablespoon of applesauce. Swallow the medicine and applesauce right away. Do not chew the medicine or applesauce. Take this medicine on an empty stomach, at least 1 hour before a meal. Take your medicine at regular intervals. Do not take your medicine more often than directed. A special MedGuide will be given to you by the pharmacist with each prescription and refill. Be sure to read this information carefully each time. Talk to your pediatrician regarding the use of this medicine in children.  While this drug may be prescribed for children as young as 1 year for selected conditions, precautions do apply. Overdosage: If you think you have taken too much of this medicine contact a poison control center or emergency room at once. NOTE: This medicine is only for you. Do not share this medicine with others. What if I miss a dose? If you miss a dose, take it as soon as you can. If it is almost time for your next dose, take only that dose. Do not take double or extra doses. What may interact with this medicine? Do not take this medicine with any of the following medications:  atazanavir  clopidogrel  nelfinavir  rilpivirine This medicine may also interact with the following medications:  antifungals like itraconazole, ketoconazole, and voriconazole  certain antivirals for HIV or hepatitis  certain medicines that treat or prevent blood clots like warfarin  cilostazol  citalopram  dasatinib  digoxin  diuretics  erlotinib  iron supplements  medicines for anxiety, panic, and sleep like diazepam  medicines for seizures like carbamazepine, phenobarbital, phenytoin  methotrexate  mycophenolate  mofetil  nilotinib  rifampin  St. John's wort  tacrolimus  vitamin B12 This list may not describe all possible interactions. Give your health care provider a list of all the medicines, herbs, non-prescription drugs, or dietary supplements you use. Also tell them if you smoke, drink alcohol, or use illegal drugs. Some items may interact with your medicine. What should I watch for while using this medicine? If you are taking this medicine without a prescription, it can take several days before your heartburn gets better. Tell your healthcare professional if your symptoms do not start to get better or if they get worse. If you need to take this medicine for more than 14 days, talk to your healthcare professional. Heartburn may sometimes be caused by a more serious condition. If you are taking this medicine with a prescription, visit your healthcare professional for regular checks on your progress. Tell your healthcare professional if your symptoms do not start to get better or if they get worse. You may need blood work done while taking this medicine. This medicine may cause a decrease in vitamin B12. You should make sure that you get enough vitamin B12 while you are taking this medicine. Discuss the foods you eat and the vitamins you take with your health care professional. What side effects may I notice from receiving this medicine? Side effects that you should report to your doctor or health care professional as soon as possible:  allergic reactions like skin rash, itching or hives, swelling of the face, lips, or tongue  bone pain  breathing problems  fever or sore throat  joint pain  rash on cheeks or arms that gets worse in the sun  severe diarrhea  signs and symptoms of kidney injury like trouble passing urine or change in the amount of urine  signs and symptoms of low magnesium like muscle cramps; muscle pain; muscle weakness; tremors; seizures; or fast, irregular  heartbeat  stomach polyps  unusual bleeding or bruising Side effects that usually do not require medical attention (report to your doctor or health care professional if they continue or are bothersome):  constipation  diarrhea  dry mouth  gas  headache  nausea  stomach pain  tiredness This list may not describe all possible side effects. Call your doctor for medical advice about side effects. You may report side effects to FDA at 1-800-FDA-1088. Where should I keep  my medicine? Keep out of the reach of children. Store at room temperature between 15 and 30 degrees C (59 and 86 degrees F). Protect from light and moisture. Throw away any unused medicine after the expiration date. NOTE: This sheet is a summary. It may not cover all possible information. If you have questions about this medicine, talk to your doctor, pharmacist, or health care provider.  2020 Elsevier/Gold Standard (2018-03-31 12:29:35)

## 2019-11-25 ENCOUNTER — Other Ambulatory Visit (INDEPENDENT_AMBULATORY_CARE_PROVIDER_SITE_OTHER): Payer: Self-pay | Admitting: *Deleted

## 2019-11-25 ENCOUNTER — Encounter (INDEPENDENT_AMBULATORY_CARE_PROVIDER_SITE_OTHER): Payer: Self-pay | Admitting: *Deleted

## 2019-11-25 ENCOUNTER — Telehealth (INDEPENDENT_AMBULATORY_CARE_PROVIDER_SITE_OTHER): Payer: Self-pay | Admitting: *Deleted

## 2019-11-25 MED ORDER — PLENVU 140 G PO SOLR: 1.0000 | Freq: Once | ORAL | 0 refills | Status: AC

## 2019-11-25 NOTE — Telephone Encounter (Signed)
Patient needs Plenvu (copay card) ° °

## 2020-01-03 ENCOUNTER — Other Ambulatory Visit (HOSPITAL_COMMUNITY)
Admission: RE | Admit: 2020-01-03 | Discharge: 2020-01-03 | Disposition: A | Discharge status: Home / Self Care | Payer: Medicare PPO | Source: Ambulatory Visit | Attending: Internal Medicine | Admitting: Internal Medicine

## 2020-01-03 ENCOUNTER — Other Ambulatory Visit: Payer: Self-pay

## 2020-01-03 ENCOUNTER — Other Ambulatory Visit (HOSPITAL_COMMUNITY): Payer: Medicare PPO

## 2020-01-03 DIAGNOSIS — Z01812 Encounter for preprocedural laboratory examination: Secondary | ICD-10-CM | POA: Diagnosis not present

## 2020-01-03 DIAGNOSIS — Z20822 Contact with and (suspected) exposure to covid-19: Secondary | ICD-10-CM | POA: Diagnosis not present

## 2020-01-03 LAB — SARS CORONAVIRUS 2 (TAT 6-24 HRS): SARS Coronavirus 2: NEGATIVE

## 2020-01-05 ENCOUNTER — Other Ambulatory Visit: Payer: Self-pay

## 2020-01-05 ENCOUNTER — Encounter (HOSPITAL_COMMUNITY): Payer: Self-pay | Admitting: Internal Medicine

## 2020-01-05 ENCOUNTER — Encounter (HOSPITAL_COMMUNITY)
Admission: RE | Disposition: A | Discharge status: Home / Self Care | Payer: Self-pay | Source: Home / Self Care | Attending: Internal Medicine

## 2020-01-05 ENCOUNTER — Ambulatory Visit (HOSPITAL_COMMUNITY)
Admission: RE | Admit: 2020-01-05 | Discharge: 2020-01-05 | Disposition: A | Discharge status: Home / Self Care | Payer: Medicare PPO | Attending: Internal Medicine | Admitting: Internal Medicine

## 2020-01-05 DIAGNOSIS — Z86010 Personal history of colonic polyps: Secondary | ICD-10-CM | POA: Diagnosis not present

## 2020-01-05 DIAGNOSIS — F17210 Nicotine dependence, cigarettes, uncomplicated: Secondary | ICD-10-CM | POA: Insufficient documentation

## 2020-01-05 DIAGNOSIS — Z79899 Other long term (current) drug therapy: Secondary | ICD-10-CM | POA: Diagnosis not present

## 2020-01-05 DIAGNOSIS — K621 Rectal polyp: Secondary | ICD-10-CM | POA: Diagnosis not present

## 2020-01-05 DIAGNOSIS — I10 Essential (primary) hypertension: Secondary | ICD-10-CM | POA: Insufficient documentation

## 2020-01-05 DIAGNOSIS — G35 Multiple sclerosis: Secondary | ICD-10-CM | POA: Diagnosis not present

## 2020-01-05 DIAGNOSIS — Z7982 Long term (current) use of aspirin: Secondary | ICD-10-CM | POA: Diagnosis not present

## 2020-01-05 DIAGNOSIS — Z1211 Encounter for screening for malignant neoplasm of colon: Secondary | ICD-10-CM | POA: Insufficient documentation

## 2020-01-05 DIAGNOSIS — D123 Benign neoplasm of transverse colon: Secondary | ICD-10-CM | POA: Insufficient documentation

## 2020-01-05 DIAGNOSIS — K6289 Other specified diseases of anus and rectum: Secondary | ICD-10-CM | POA: Diagnosis not present

## 2020-01-05 DIAGNOSIS — Z8719 Personal history of other diseases of the digestive system: Secondary | ICD-10-CM | POA: Diagnosis not present

## 2020-01-05 DIAGNOSIS — K644 Residual hemorrhoidal skin tags: Secondary | ICD-10-CM | POA: Diagnosis not present

## 2020-01-05 DIAGNOSIS — Z09 Encounter for follow-up examination after completed treatment for conditions other than malignant neoplasm: Secondary | ICD-10-CM | POA: Diagnosis not present

## 2020-01-05 DIAGNOSIS — K219 Gastro-esophageal reflux disease without esophagitis: Secondary | ICD-10-CM | POA: Diagnosis not present

## 2020-01-05 DIAGNOSIS — K5730 Diverticulosis of large intestine without perforation or abscess without bleeding: Secondary | ICD-10-CM | POA: Insufficient documentation

## 2020-01-05 DIAGNOSIS — K5732 Diverticulitis of large intestine without perforation or abscess without bleeding: Secondary | ICD-10-CM

## 2020-01-05 HISTORY — PX: COLONOSCOPY: SHX5424

## 2020-01-05 HISTORY — PX: POLYPECTOMY: SHX5525

## 2020-01-05 SURGERY — COLONOSCOPY
Anesthesia: Moderate Sedation

## 2020-01-05 MED ORDER — MEPERIDINE HCL 50 MG/ML IJ SOLN
INTRAMUSCULAR | Status: AC
  Filled 2020-01-05: qty 1

## 2020-01-05 MED ORDER — MEPERIDINE HCL 50 MG/ML IJ SOLN
INTRAMUSCULAR | Status: DC | PRN
  Administered 2020-01-05 (×3): 25 mg via INTRAVENOUS

## 2020-01-05 MED ORDER — SODIUM CHLORIDE 0.9 % IV SOLN: INTRAVENOUS | Status: DC

## 2020-01-05 MED ORDER — MIDAZOLAM HCL 5 MG/5ML IJ SOLN
INTRAMUSCULAR | Status: DC | PRN
  Administered 2020-01-05: 3 mg via INTRAVENOUS
  Administered 2020-01-05 (×3): 2 mg via INTRAVENOUS
  Administered 2020-01-05: 3 mg via INTRAVENOUS

## 2020-01-05 MED ORDER — STERILE WATER FOR IRRIGATION IR SOLN
Status: DC | PRN
  Administered 2020-01-05: 1.5 mL

## 2020-01-05 MED ORDER — MIDAZOLAM HCL 5 MG/5ML IJ SOLN
INTRAMUSCULAR | Status: AC
  Filled 2020-01-05: qty 10

## 2020-01-05 MED ORDER — MIDAZOLAM HCL 5 MG/5ML IJ SOLN
INTRAMUSCULAR | Status: AC
  Filled 2020-01-05: qty 5

## 2020-01-05 NOTE — Op Note (Signed)
[facility] Patient Name: [name] Procedure Date: 01/05/2020 7:26 AM MRN: [id number] Date of Birth: 04-Jan-1956 Attending MD: Hildred Laser , MD CSN: [id number] Age: 64 Admit Type: Outpatient Procedure:                Colonoscopy Indications:              High risk colon cancer surveillance: Personal                            history of colonic polyps Providers:                Hildred Laser, MD, Otis Peak B. Sharon Seller, RN, Nelma Rothman, Technician Referring MD:             Halford Chessman, MD Medicines:                Meperidine 75 mg IV, Midazolam 12 mg IV Complications:            No immediate complications. Estimated Blood Loss:     Estimated blood loss was minimal. Procedure:                Pre-Anesthesia Assessment:                           - Prior to the procedure, a History and Physical                            was performed, and patient medications and                            allergies were reviewed. The patient's tolerance of                            previous anesthesia was also reviewed. The risks                            and benefits of the procedure and the sedation                            options and risks were discussed with the patient.                            All questions were answered, and informed consent                            was obtained. Prior Anticoagulants: The patient has                            taken no previous anticoagulant or antiplatelet                            agents except for aspirin. ASA Grade Assessment:  III - A patient with severe systemic disease. After                            reviewing the risks and benefits, the patient was                            deemed in satisfactory condition to undergo the                            procedure.                           After obtaining informed consent, the colonoscope                            was passed under direct  vision. Throughout the                            procedure, the patient's blood pressure, pulse, and                            oxygen saturations were monitored continuously. The                            [id number]-[id number] ([id number]) scope was introduced through                            the anus and advanced to the the cecum, identified                            by appendiceal orifice and ileocecal valve. The                            colonoscopy was technically difficult and complex                            due to a redundant colon and a tortuous colon.                            Successful completion of the procedure was aided by                            using manual pressure and scope guide. The patient                            tolerated the procedure well. The quality of the                            bowel preparation was adequate. The ileocecal                            valve, appendiceal orifice, and rectum were  photographed. Scope In: 7:50:01 AM Scope Out: 8:16:28 AM Scope Withdrawal Time: 0 hours 12 minutes 11 seconds  Total Procedure Duration: 0 hours 26 minutes 27 seconds  Findings:      The perianal and digital rectal examinations were normal.      A 8 mm polyp was found in the mid transverse colon. The polyp was       semi-pedunculated. The polyp was removed with a hot snare. Resection and       retrieval were complete. The pathology specimen was placed into Bottle       Number 1.      Multiple small and large-mouthed diverticula were found in the sigmoid       colon.      A small polyp was found in the distal rectum. The polyp was sessile. The       polyp was removed with a cold snare. Resection was complete, but the       polyp tissue was not retrieved.      External hemorrhoids were found during retroflexion. The hemorrhoids       were small.      Anal papilla(e) were hypertrophied. Impression:               - One 8 mm polyp in the mid  transverse colon,                            removed with a hot snare. Resected and retrieved.                           - Diverticulosis in the sigmoid colon.                           - One small polyp in the distal rectum, removed                            with a cold snare. Complete resection. Polyp tissue                            not retrieved.                           - External hemorrhoids.                           - Anal papilla(e) were hypertrophied. Moderate Sedation:      Moderate (conscious) sedation was administered by the endoscopy nurse       and supervised by the endoscopist. The following parameters were       monitored: oxygen saturation, heart rate, blood pressure, CO2       capnography and response to care. Total physician intraservice time was       34 minutes. Recommendation:           - Patient has a contact number available for                            emergencies. The signs and symptoms of potential  delayed complications were discussed with the                            patient. Return to normal activities tomorrow.                            Written discharge instructions were provided to the                            patient.                           - High fiber diet today.                           - Continue present medications.                           - No aspirin, ibuprofen, naproxen, or other                            non-steroidal anti-inflammatory drugs for 5 days                            after polyp removal.                           - Await pathology results.                           - Repeat colonoscopy is recommended. The                            colonoscopy date will be determined after pathology                            results from today's exam become available for                            review. Procedure Code(s):        --- Professional ---                           [phone], Colonoscopy,  flexible; with removal of                            tumor(s), polyp(s), or other lesion(s) by snare                            technique                           [id number], Moderate sedation; each additional 15                            minutes intraservice time  G0500, Moderate sedation services provided by the                            same physician or other qualified health care                            professional performing a gastrointestinal                            endoscopic service that sedation supports,                            requiring the presence of an independent trained                            observer to assist in the monitoring of the                            patient's level of consciousness and physiological                            status; initial 15 minutes of intra-service time;                            patient age 60 years or older (additional time may                            be reported with [id number], as appropriate) Diagnosis Code(s):        --- Professional ---                           Z86.010, Personal history of colonic polyps                           K63.5, Polyp of colon                           K62.1, Rectal polyp                           K64.4, Residual hemorrhoidal skin tags                           K62.89, Other specified diseases of anus and rectum                           K57.30, Diverticulosis of large intestine without                            perforation or abscess without bleeding CPT copyright 2019 American Medical Association. All rights reserved. The codes documented in this report are preliminary and upon coder review may  be revised to meet current compliance requirements. Hildred Laser, MD Hildred Laser, MD 01/05/2020 8:32:52 AM This report has been signed electronically. Number of Addenda: 0

## 2020-01-05 NOTE — Discharge Instructions (Signed)
Colonoscopy, Adult, Care After This sheet gives you information about how to care for yourself after your procedure. Your doctor may also give you more specific instructions. If you have problems or questions, call your doctor. What can I expect after the procedure? After the procedure, it is common to have:  A small amount of blood in your poop (stool) for 24 hours.  Some gas.  Mild cramping or bloating in your belly (abdomen). Follow these instructions at home: Eating and drinking   Drink enough fluid to keep your pee (urine) pale yellow.  Follow instructions from your doctor about what you cannot eat or drink.  Return to your normal diet as told by your doctor. Avoid heavy or fried foods that are hard to digest. Activity  Rest as told by your doctor.  Do not sit for a long time without moving. Get up to take short walks every 1-2 hours. This is important. Ask for help if you feel weak or unsteady.  Return to your normal activities as told by your doctor. Ask your doctor what activities are safe for you. To help cramping and bloating:   Try walking around.  Put heat on your belly as told by your doctor. Use the heat source that your doctor recommends, such as a moist heat pack or a heating pad. ? Put a towel between your skin and the heat source. ? Leave the heat on for 20-30 minutes. ? Remove the heat if your skin turns bright red. This is very important if you are unable to feel pain, heat, or cold. You may have a greater risk of getting burned. General instructions  For the first 24 hours after the procedure: ? Do not drive or use machinery. ? Do not sign important documents. ? Do not drink alcohol. ? Do your daily activities more slowly than normal. ? Eat foods that are soft and easy to digest.  Take over-the-counter or prescription medicines only as told by your doctor.  Keep all follow-up visits as told by your doctor. This is important. Contact a doctor  if:  You have blood in your poop 2-3 days after the procedure. Get help right away if:  You have more than a small amount of blood in your poop.  You see large clumps of tissue (blood clots) in your poop.  Your belly is swollen.  You feel like you may vomit (nauseous).  You vomit.  You have a fever.  You have belly pain that gets worse, and medicine does not help your pain. Summary  After the procedure, it is common to have a small amount of blood in your poop. You may also have mild cramping and bloating in your belly.  For the first 24 hours after the procedure, do not drive or use machinery, do not sign important documents, and do not drink alcohol.  Get help right away if you have a lot of blood in your poop, feel like you may vomit, have a fever, or have more belly pain. This information is not intended to replace advice given to you by your health care provider. Make sure you discuss any questions you have with your health care provider. Document Revised: 01/03/2019 Document Reviewed: 01/03/2019 Elsevier Patient Education  [place].   No aspirin or NSAIDs for 5 days. Resume other medications as before. High-fiber diet. No driving for 24 hours. Physician will call with biopsy results.   Diverticulosis  Diverticulosis is a condition that develops when small pouches (  diverticula) form in the wall of the large intestine (colon). The colon is where water is absorbed and stool (feces) is formed. The pouches form when the inside layer of the colon pushes through weak spots in the outer layers of the colon. You may have a few pouches or many of them. The pouches usually do not cause problems unless they become inflamed or infected. When this happens, the condition is called diverticulitis. What are the causes? The cause of this condition is not known. What increases the risk? The following factors may make you more likely to develop this condition:  Being older  than age 63. Your risk for this condition increases with age. Diverticulosis is rare among people younger than age 80. By age 63, many people have it.  Eating a low-fiber diet.  Having frequent constipation.  Being overweight.  Not getting enough exercise.  Smoking.  Taking over-the-counter pain medicines, like aspirin and ibuprofen.  Having a family history of diverticulosis. What are the signs or symptoms? In most people, there are no symptoms of this condition. If you do have symptoms, they may include:  Bloating.  Cramps in the abdomen.  Constipation or diarrhea.  Pain in the lower left side of the abdomen. How is this diagnosed? Because diverticulosis usually has no symptoms, it is most often diagnosed during an exam for other colon problems. The condition may be diagnosed by:  Using a flexible scope to examine the colon (colonoscopy).  Taking an X-ray of the colon after dye has been put into the colon (barium enema).  Having a CT scan. How is this treated? You may not need treatment for this condition. Your health care provider may recommend treatment to prevent problems. You may need treatment if you have symptoms or if you previously had diverticulitis. Treatment may include:  Eating a high-fiber diet.  Taking a fiber supplement.  Taking a live bacteria supplement (probiotic).  Taking medicine to relax your colon. Follow these instructions at home: Medicines  Take over-the-counter and prescription medicines only as told by your health care provider.  If told by your health care provider, take a fiber supplement or probiotic. Constipation prevention Your condition may cause constipation. To prevent or treat constipation, you may need to:  Drink enough fluid to keep your urine pale yellow.  Take over-the-counter or prescription medicines.  Eat foods that are high in fiber, such as beans, whole grains, and fresh fruits and vegetables.  Limit foods that  are high in fat and processed sugars, such as fried or sweet foods.  General instructions  Try not to strain when you have a bowel movement.  Keep all follow-up visits as told by your health care provider. This is important. Contact a health care provider if you:  Have pain in your abdomen.  Have bloating.  Have cramps.  Have not had a bowel movement in 3 days. Get help right away if:  Your pain gets worse.  Your bloating becomes very bad.  You have a fever or chills, and your symptoms suddenly get worse.  You vomit.  You have bowel movements that are bloody or black.  You have bleeding from your rectum. Summary  Diverticulosis is a condition that develops when small pouches (diverticula) form in the wall of the large intestine (colon).  You may have a few pouches or many of them.  This condition is most often diagnosed during an exam for other colon problems.  Treatment may include increasing the fiber in your  diet, taking supplements, or taking medicines. This information is not intended to replace advice given to you by your health care provider. Make sure you discuss any questions you have with your health care provider. Document Revised: 01/06/2019 Document Reviewed: 01/06/2019 Elsevier Patient Education  2020 [employer]   Colon Polyps  Polyps are tissue growths inside the body. Polyps can grow in many places, including the large intestine (colon). A polyp may be a round bump or a mushroom-shaped growth. You could have one polyp or several. Most colon polyps are noncancerous (benign). However, some colon polyps can become cancerous over time. Finding and removing the polyps early can help prevent this. What are the causes? The exact cause of colon polyps is not known. What increases the risk? You are more likely to develop this condition if you:  Have a family history of colon cancer or colon polyps.  Are older than 35 or older than 45 if you are African  American.  Have inflammatory bowel disease, such as ulcerative colitis or Crohn's disease.  Have certain hereditary conditions, such as: ? Familial adenomatous polyposis. ? Lynch syndrome. ? Turcot syndrome. ? Peutz-Jeghers syndrome.  Are overweight.  Smoke cigarettes.  Do not get enough exercise.  Drink too much alcohol.  Eat a diet that is high in fat and red meat and low in fiber.  Had childhood cancer that was treated with abdominal radiation. What are the signs or symptoms? Most polyps do not cause symptoms. If you have symptoms, they may include:  Blood coming from your rectum when having a bowel movement.  Blood in your stool. The stool may look dark red or black.  Abdominal pain.  A change in bowel habits, such as constipation or diarrhea. How is this diagnosed? This condition is diagnosed with a colonoscopy. This is a procedure in which a lighted, flexible scope is inserted into the anus and then passed into the colon to examine the area. Polyps are sometimes found when a colonoscopy is done as part of routine cancer screening tests. How is this treated? Treatment for this condition involves removing any polyps that are found. Most polyps can be removed during a colonoscopy. Those polyps will then be tested for cancer. Additional treatment may be needed depending on the results of testing. Follow these instructions at home: Lifestyle  Maintain a healthy weight, or lose weight if recommended by your health care provider.  Exercise every day or as told by your health care provider.  Do not use any products that contain nicotine or tobacco, such as cigarettes and e-cigarettes. If you need help quitting, ask your health care provider.  If you drink alcohol, limit how much you have: ? 0-1 drink a day for women. ? 0-2 drinks a day for men.  Be aware of how much alcohol is in your drink. In the [place] one drink equals one 12 oz bottle of beer (355 mL), one 5 oz glass  of wine (148 mL), or one 1 oz shot of hard liquor (44 mL). Eating and drinking   Eat foods that are high in fiber, such as fruits, vegetables, and whole grains.  Eat foods that are high in calcium and vitamin D, such as milk, cheese, yogurt, eggs, liver, fish, and broccoli.  Limit foods that are high in fat, such as fried foods and desserts.  Limit the amount of red meat and processed meat you eat, such as hot dogs, sausage, bacon, and lunch meats. General instructions  Keep all follow-up  visits as told by your health care provider. This is important. ? This includes having regularly scheduled colonoscopies. ? Talk to your health care provider about when you need a colonoscopy. Contact a health care provider if:  You have new or worsening bleeding during a bowel movement.  You have new or increased blood in your stool.  You have a change in bowel habits.  You lose weight for no known reason. Summary  Polyps are tissue growths inside the body. Polyps can grow in many places, including the colon.  Most colon polyps are noncancerous (benign), but some can become cancerous over time.  This condition is diagnosed with a colonoscopy.  Treatment for this condition involves removing any polyps that are found. Most polyps can be removed during a colonoscopy. This information is not intended to replace advice given to you by your health care provider. Make sure you discuss any questions you have with your health care provider. Document Revised: 09/24/2017 Document Reviewed: 09/24/2017 Elsevier Patient Education  [place].   High-Fiber Diet Fiber, also called dietary fiber, is a type of carbohydrate that is found in fruits, vegetables, whole grains, and beans. A high-fiber diet can have many health benefits. Your health care provider may recommend a high-fiber diet to help:  Prevent constipation. Fiber can make your bowel movements more regular.  Lower your  cholesterol.  Relieve the following conditions: ? Swelling of veins in the anus (hemorrhoids). ? Swelling and irritation (inflammation) of specific areas of the digestive tract (uncomplicated diverticulosis). ? A problem of the large intestine (colon) that sometimes causes pain and diarrhea (irritable bowel syndrome, IBS).  Prevent overeating as part of a weight-loss plan.  Prevent heart disease, type 2 diabetes, and certain cancers. What is my plan? The recommended daily fiber intake in grams (g) includes:  38 g for men age 13 or younger.  30 g for men over age 81.  62 g for women age 75 or younger.  21 g for women over age 44. You can get the recommended daily intake of dietary fiber by:  Eating a variety of fruits, vegetables, grains, and beans.  Taking a fiber supplement, if it is not possible to get enough fiber through your diet. What do I need to know about a high-fiber diet?  It is better to get fiber through food sources rather than from fiber supplements. There is not a lot of research about how effective supplements are.  Always check the fiber content on the nutrition facts label of any prepackaged food. Look for foods that contain 5 g of fiber or more per serving.  Talk with a diet and nutrition specialist (dietitian) if you have questions about specific foods that are recommended or not recommended for your medical condition, especially if those foods are not listed below.  Gradually increase how much fiber you consume. If you increase your intake of dietary fiber too quickly, you may have bloating, cramping, or gas.  Drink plenty of water. Water helps you to digest fiber. What are tips for following this plan?  Eat a wide variety of high-fiber foods.  Make sure that half of the grains that you eat each day are whole grains.  Eat breads and cereals that are made with whole-grain flour instead of refined flour or white flour.  Eat brown rice, bulgur wheat, or  millet instead of white rice.  Start the day with a breakfast that is high in fiber, such as a cereal that contains 5  g of fiber or more per serving.  Use beans in place of meat in soups, salads, and pasta dishes.  Eat high-fiber snacks, such as berries, raw vegetables, nuts, and popcorn.  Choose whole fruits and vegetables instead of processed forms like juice or sauce. What foods can I eat?  Fruits Berries. Pears. Apples. Oranges. Avocado. Prunes and raisins. Dried figs. Vegetables Sweet potatoes. Spinach. Kale. Artichokes. Cabbage. Broccoli. Cauliflower. Green peas. Carrots. Squash. Grains Whole-grain breads. Multigrain cereal. Oats and oatmeal. Brown rice. Barley. Bulgur wheat. [place]. Quinoa. Bran muffins. Popcorn. Rye wafer crackers. Meats and other proteins Navy, kidney, and pinto beans. Soybeans. Split peas. Lentils. Nuts and seeds. Dairy Fiber-fortified yogurt. Beverages Fiber-fortified soy milk. Fiber-fortified orange juice. Other foods Fiber bars. The items listed above may not be a complete list of recommended foods and beverages. Contact a dietitian for more options. What foods are not recommended? Fruits Fruit juice. Cooked, strained fruit. Vegetables Fried potatoes. Canned vegetables. Well-cooked vegetables. Grains White bread. Pasta made with refined flour. White rice. Meats and other proteins Fatty cuts of meat. Fried chicken or fried fish. Dairy Milk. Yogurt. Cream cheese. Sour cream. Fats and oils Butters. Beverages Soft drinks. Other foods Cakes and pastries. The items listed above may not be a complete list of foods and beverages to avoid. Contact a dietitian for more information. Summary  Fiber is a type of carbohydrate. It is found in fruits, vegetables, whole grains, and beans.  There are many health benefits of eating a high-fiber diet, such as preventing constipation, lowering blood cholesterol, helping with weight loss, and reducing your risk  of heart disease, diabetes, and certain cancers.  Gradually increase your intake of fiber. Increasing too fast can result in cramping, bloating, and gas. Drink plenty of water while you increase your fiber.  The best sources of fiber include whole fruits and vegetables, whole grains, nuts, seeds, and beans. This information is not intended to replace advice given to you by your health care provider. Make sure you discuss any questions you have with your health care provider. Document Revised: 04/13/2017 Document Reviewed: 04/13/2017 Elsevier Patient Education  2020 [employer].

## 2020-01-05 NOTE — H&P (Signed)
[name] is an 64 y.o. female.   Chief Complaint: Patient is here for colonoscopy. HPI: Patient is 64 year old Caucasian female with history of MS seizure disorder as well as history of colonic adenoma who is here for surveillance colonoscopy.  She had a tubular adenoma removed in 2008 but she did not have any polyps in March 2014 and she was advised to wait 7 years.  She has a history of diverticulitis.  She was seen in emergency room about 7 weeks ago for diarrhea illness.  CT scan did not show any changes of diverticulitis.  She had new finding of pancreatic lesion felt to be IPMN. Also had single episode of hematochezia felt to be due to hemorrhoids.  Her stool was guaiac negative. Family history is negative for colorectal carcinoma but her father had surgery for gastric cancer and died of unrelated causes 22 years later at age 92.  Past Medical History:  Diagnosis Date  . Arthritis    Osteoarthritis  . Colon polyps   . GERD (gastroesophageal reflux disease)   . Hiatal hernia   . Hypertension   . MS (multiple sclerosis) ([place]) 12/09/2013  . Multiple sclerosis ([place])   . Seizures ([place]) 01/04/2005   1 and Only    Past Surgical History:  Procedure Laterality Date  . COLONOSCOPY    . COLONOSCOPY N/A 09/16/2012   Procedure: COLONOSCOPY;  Surgeon: Rogene Houston, MD;  Location: [facility];  Service: Endoscopy;  Laterality: N/A;  1030-rescheduled to 9:30 Ann notified pt  . DILATION AND CURETTAGE OF UTERUS    . ORIF HUMERUS FRACTURE Left 02/16/2017   Procedure: OPEN REDUCTION INTERNAL FIXATION (ORIF) LEFT PROXIMAL HUMERUS FRACTURE;  Surgeon: Meredith Pel, MD;  Location: [facility];  Service: Orthopedics;  Laterality: Left;    Family History  Problem Relation Age of Onset  . Stomach cancer Father   . Sick sinus syndrome Father   . Colon cancer Neg Hx    Social History:  reports that she has been smoking cigarettes. She has a 22.50 pack-year smoking history. She has never used  smokeless tobacco. She reports current alcohol use of about 8.0 standard drinks of alcohol per week. She reports that she does not use drugs.  Allergies:  Allergies  Allergen Reactions  . Sulfa Antibiotics     Unsure   . Codeine Nausea And Vomiting  . Dilaudid [Hydromorphone Hcl] Nausea And Vomiting    Zofran helped    Medications Prior to Admission  Medication Sig Dispense Refill  . aspirin 81 MG chewable tablet Chew 81 mg by mouth 3 (three) times a week.    . Cholecalciferol 50 MCG (2000 UT) CAPS Take 2,000 Units by mouth daily.     . Cyanocobalamin (VITAMIN B-12) 3000 MCG SUBL Place 3,000 mcg under the tongue daily.     . diazepam (VALIUM) 5 MG tablet Take 2.5 mg by mouth at bedtime as needed (sleep).     Marland Kitchen esomeprazole (NEXIUM) 40 MG capsule TAKE 1 CAPSULE BY MOUTH DAILY AT 12 NOON (Patient taking differently: Take 40 mg by mouth daily. TAKE 1 CAPSULE BY MOUTH DAILY AT 12 NOON) 90 capsule 3  . Glatiramer Acetate (COPAXONE) 40 MG/ML SOSY INJECT 40MG  SUBCUTANEOUSLY  THREE TIMES WEEKLY AT LEAST 48 HOURS APART ON MONDAY,  THURSDAY, AND SUNDAY  EVENING (Patient taking differently: Inject 40 mg into the skin See admin instructions. INJECT 40MG  SUBCUTANEOUSLY  THREE TIMES WEEKLY AT LEAST 48 HOURS APART ON MONDAY,  THURSDAY,  AND SUNDAY  EVENING) 12 mL 5  . losartan (COZAAR) 50 MG tablet Take 50 mg by mouth daily.     . Probiotic Product (PROBIOTIC PO) Take 1 capsule by mouth daily.    . Psyllium (METAMUCIL FIBER PO) Take 2 Scoops by mouth at bedtime.     . Vilazodone HCl (VIIBRYD) 10 MG TABS TAKE 1 TABLET BY MOUTH DAILY OR AS DIRECTED BY DOCTOR (Patient taking differently: Take 5 mg by mouth at bedtime. TAKE 1 TABLET BY MOUTH DAILY OR AS DIRECTED BY DOCTOR) 90 tablet 3  . fluorometholone (FML) 0.1 % ophthalmic suspension Place 1 drop into both eyes in the morning, at noon, and at bedtime. For 2 weeks    . loperamide (IMODIUM) 2 MG capsule Take 1 capsule (2 mg total) by mouth 4 (four) times daily  as needed for diarrhea or loose stools. (Patient not taking: Reported on 12/23/2019) 12 capsule 0  . ondansetron (ZOFRAN) 4 MG tablet Take 1 tablet (4 mg total) by mouth every 8 (eight) hours as needed for nausea or vomiting. (Patient not taking: Reported on 12/23/2019) 10 tablet 0    Results for orders placed or performed during the hospital encounter of 01/03/20 (from the past 48 hour(s))  SARS CORONAVIRUS 2 (TAT 6-24 HRS) Nasopharyngeal Nasopharyngeal Swab     Status: None   Collection Time: 01/03/20 10:15 AM   Specimen: Nasopharyngeal Swab  Result Value Ref Range   SARS Coronavirus 2 NEGATIVE NEGATIVE    Comment: (NOTE) SARS-CoV-2 target nucleic acids are NOT DETECTED.  The SARS-CoV-2 RNA is generally detectable in upper and lower respiratory specimens during the acute phase of infection. Negative results do not preclude SARS-CoV-2 infection, do not rule out co-infections with other pathogens, and should not be used as the sole basis for treatment or other patient management decisions. Negative results must be combined with clinical observations, patient history, and epidemiological information. The expected result is Negative.  Fact Sheet for Patients: SugarRoll.be  Fact Sheet for Healthcare Providers: https://www.woods-mathews.com/  This test is not yet approved or cleared by the [place] FDA and  has been authorized for detection and/or diagnosis of SARS-CoV-2 by FDA under an Emergency Use Authorization (EUA). This EUA will remain  in effect (meaning this test can be used) for the duration of the COVID-19 declaration under Se ction 564(b)(1) of the Act, 21 U.S.C. section 360bbb-3(b)(1), unless the authorization is terminated or revoked sooner.  Performed at [facility], [place] [address], [place], [place] [address]    No results found.  Review of Systems  Blood pressure (!) 150/89, pulse 65, temperature 97.8 F (36.6 C),  temperature source Oral, resp. rate 14, height 5\' 6"  (1.676 m), SpO2 100 %. Physical Exam HENT:     Mouth/Throat:     Mouth: Mucous membranes are moist.     Pharynx: Oropharynx is clear.  Eyes:     General: No scleral icterus.    Conjunctiva/sclera: Conjunctivae normal.  Cardiovascular:     Rate and Rhythm: Normal rate and regular rhythm.     Heart sounds: Normal heart sounds. No murmur heard.   Pulmonary:     Effort: Pulmonary effort is normal.     Breath sounds: Normal breath sounds.  Abdominal:     General: Abdomen is flat.     Palpations: Abdomen is soft. There is no mass.     Tenderness: There is no abdominal tenderness.  Musculoskeletal:        General: No  swelling.     Cervical back: Neck supple.  Lymphadenopathy:     Cervical: No cervical adenopathy.  Skin:    General: Skin is warm and dry.  Neurological:     Mental Status: She is alert.      Assessment/Plan History of colonic adenoma. Surveillance colonoscopy.  Hildred Laser, MD 01/05/2020, 7:38 AM

## 2020-01-06 LAB — SURGICAL PATHOLOGY

## 2020-01-10 ENCOUNTER — Encounter (HOSPITAL_COMMUNITY): Payer: Self-pay | Admitting: Internal Medicine

## 2020-01-30 ENCOUNTER — Other Ambulatory Visit: Payer: Self-pay | Admitting: Family Medicine

## 2020-01-30 DIAGNOSIS — G4700 Insomnia, unspecified: Secondary | ICD-10-CM

## 2020-01-30 DIAGNOSIS — G35 Multiple sclerosis: Secondary | ICD-10-CM

## 2020-01-30 DIAGNOSIS — K2100 Gastro-esophageal reflux disease with esophagitis, without bleeding: Secondary | ICD-10-CM

## 2020-01-30 MED ORDER — ESOMEPRAZOLE MAGNESIUM 40 MG PO CPDR: DELAYED_RELEASE_CAPSULE | ORAL | 3 refills | Status: DC

## 2020-01-30 MED ORDER — VIIBRYD 10 MG PO TABS: ORAL_TABLET | ORAL | 3 refills | Status: DC

## 2020-01-30 NOTE — Telephone Encounter (Signed)
Pt is requesting a refill for Vilazodone HCl (VIIBRYD) 10 MG TABS &  esomeprazole (NEXIUM) 40 MG capsule.  Pharmacy: [facility] [phone]

## 2020-01-30 NOTE — Addendum Note (Signed)
Addended by: Brandon Melnick on: 01/30/2020 10:52 AM   Modules accepted: Orders

## 2020-02-14 DIAGNOSIS — H40003 Preglaucoma, unspecified, bilateral: Secondary | ICD-10-CM | POA: Diagnosis not present

## 2020-03-21 DIAGNOSIS — H40003 Preglaucoma, unspecified, bilateral: Secondary | ICD-10-CM | POA: Diagnosis not present

## 2020-04-04 ENCOUNTER — Other Ambulatory Visit: Payer: Self-pay | Admitting: Neurology

## 2020-04-04 DIAGNOSIS — K2100 Gastro-esophageal reflux disease with esophagitis, without bleeding: Secondary | ICD-10-CM

## 2020-04-04 DIAGNOSIS — G4700 Insomnia, unspecified: Secondary | ICD-10-CM

## 2020-04-04 DIAGNOSIS — G35 Multiple sclerosis: Secondary | ICD-10-CM

## 2020-06-18 ENCOUNTER — Telehealth: Payer: Self-pay

## 2020-06-18 DIAGNOSIS — G35 Multiple sclerosis: Secondary | ICD-10-CM | POA: Diagnosis not present

## 2020-06-18 DIAGNOSIS — Z79899 Other long term (current) drug therapy: Secondary | ICD-10-CM | POA: Diagnosis not present

## 2020-06-18 DIAGNOSIS — I10 Essential (primary) hypertension: Secondary | ICD-10-CM | POA: Diagnosis not present

## 2020-06-18 DIAGNOSIS — R519 Headache, unspecified: Secondary | ICD-10-CM | POA: Diagnosis not present

## 2020-06-18 DIAGNOSIS — K219 Gastro-esophageal reflux disease without esophagitis: Secondary | ICD-10-CM | POA: Diagnosis not present

## 2020-06-18 DIAGNOSIS — E7849 Other hyperlipidemia: Secondary | ICD-10-CM | POA: Diagnosis not present

## 2020-06-18 DIAGNOSIS — R51 Headache with orthostatic component, not elsewhere classified: Secondary | ICD-10-CM | POA: Diagnosis not present

## 2020-06-18 DIAGNOSIS — Z6823 Body mass index (BMI) 23.0-23.9, adult: Secondary | ICD-10-CM | POA: Diagnosis not present

## 2020-06-18 NOTE — Telephone Encounter (Signed)
Received PA request for copaxone. Attempted PA on CMM. Sent to [facility]. Received this message: "Authorization already on file for this request.Authorization starting on 06/23/2020 and ending on 06/22/2021."

## 2020-06-19 ENCOUNTER — Telehealth: Payer: Self-pay | Admitting: Family Medicine

## 2020-06-19 ENCOUNTER — Other Ambulatory Visit: Payer: Self-pay | Admitting: Family Medicine

## 2020-06-19 DIAGNOSIS — G35 Multiple sclerosis: Secondary | ICD-10-CM

## 2020-06-19 NOTE — Telephone Encounter (Signed)
MRI ordered. Any concerns of new weakness, numbness/tingling, or vision changes? Please have her schedule follow up if headaches do not improve.

## 2020-06-19 NOTE — Progress Notes (Signed)
mr

## 2020-06-19 NOTE — Telephone Encounter (Signed)
I called pt and she stated that she has no new weakness, numbness or tingling, vision is same, (just sensitivity) which she states she has had all her life. I relayed about her MRI, and she will call if headaches do not improve.

## 2020-06-19 NOTE — Telephone Encounter (Addendum)
I called pt.  She wasnts to proceed with MRI's for her MS, because of having now 2 wks of headaches.  Seeing her pcp for headache.  Had toradol inj. Would like to go to [facility] on [address]. Needs open scanner.  (has used them in past).

## 2020-06-19 NOTE — Telephone Encounter (Signed)
Patient called about having MRI scheduled she discussed with Amy during last visit. She is having a problem with headaches for 2 weeks and has also seen her PCP. Please call her at [phone].

## 2020-06-25 NOTE — Telephone Encounter (Signed)
[name] Berkley Harvey: [id number] (exp. 06/21/20 to 07/21/20) order faxed to [facility] they will reach out to the patient to schedule.

## 2020-09-03 ENCOUNTER — Other Ambulatory Visit (HOSPITAL_COMMUNITY): Payer: Self-pay | Admitting: Family Medicine

## 2020-09-03 DIAGNOSIS — Z1231 Encounter for screening mammogram for malignant neoplasm of breast: Secondary | ICD-10-CM

## 2020-09-13 ENCOUNTER — Ambulatory Visit (HOSPITAL_COMMUNITY)
Admission: RE | Admit: 2020-09-13 | Discharge: 2020-09-13 | Disposition: A | Discharge status: Home / Self Care | Payer: Medicare PPO | Source: Ambulatory Visit | Attending: Family Medicine | Admitting: Family Medicine

## 2020-09-13 ENCOUNTER — Other Ambulatory Visit: Payer: Self-pay

## 2020-09-13 DIAGNOSIS — Z1231 Encounter for screening mammogram for malignant neoplasm of breast: Secondary | ICD-10-CM | POA: Insufficient documentation

## 2020-09-18 DIAGNOSIS — H40003 Preglaucoma, unspecified, bilateral: Secondary | ICD-10-CM | POA: Diagnosis not present

## 2020-09-24 ENCOUNTER — Telehealth: Payer: Self-pay | Admitting: Family Medicine

## 2020-09-24 NOTE — Telephone Encounter (Signed)
[facility] Patient Support Elmyra Ricks) LVM, Pt has two MRIs scheduled on April 27 scheduled with CPT  code [phone] and CPT code [place]. Authorization on file has expired with [facility], so a new authorization request would have to be submitted.  [facility] NPI no: [id number]. If you have any question you can reach me direct [phone]

## 2020-09-25 NOTE — Telephone Encounter (Signed)
Noted, I am aware of this and I am working on it.

## 2020-09-25 NOTE — Telephone Encounter (Signed)
Noted Raquel Sarna aware of details .

## 2020-09-25 NOTE — Telephone Encounter (Signed)
Pending uploaded notes on the portal.

## 2020-09-25 NOTE — Telephone Encounter (Signed)
[name]: [id number] (exp. 09/25/20 to 10/25/20) pt is scheduled at [facility] for 10/17/20.

## 2020-10-17 DIAGNOSIS — G35 Multiple sclerosis: Secondary | ICD-10-CM | POA: Diagnosis not present

## 2020-10-17 DIAGNOSIS — M50222 Other cervical disc displacement at C5-C6 level: Secondary | ICD-10-CM | POA: Diagnosis not present

## 2020-10-17 DIAGNOSIS — M4312 Spondylolisthesis, cervical region: Secondary | ICD-10-CM | POA: Diagnosis not present

## 2020-10-17 DIAGNOSIS — M4802 Spinal stenosis, cervical region: Secondary | ICD-10-CM | POA: Diagnosis not present

## 2020-10-25 ENCOUNTER — Telehealth: Payer: Self-pay | Admitting: Family Medicine

## 2020-10-25 NOTE — Telephone Encounter (Signed)
Please let her know that I have reviewed the results. From as MS standpoint, everything looks good. No new lesions! She does have some cervical spine degeneration and foraminal stenosis. This is similar to previous results and does not appear to be any worse. I will place CD results with Dr [name] as she may want to visualize the actual images. Have her continue current treatment plan and follow up as directed.

## 2020-10-25 NOTE — Telephone Encounter (Signed)
Placed results on desk, from [place]. (in care everywhere).

## 2020-10-25 NOTE — Telephone Encounter (Signed)
Pt is asking for a call with the results to the MRI she had on 04-27

## 2020-10-25 NOTE — Telephone Encounter (Signed)
I called pt and relayed the results of MRI brain and cervical spine per AL/NP below.  She verbalized understanding. Will call back once Dr. [name] signs off and if any additional information to add.  Pt has appt 11-28-20 with AL/NP.  Requested to see CD/MD next time.  (as not seen her in 3 yrs).

## 2020-11-28 ENCOUNTER — Ambulatory Visit: Payer: Medicare PPO | Admitting: Family Medicine

## 2021-02-06 DIAGNOSIS — R000 Tachycardia, unspecified: Secondary | ICD-10-CM | POA: Diagnosis not present

## 2021-02-06 DIAGNOSIS — E559 Vitamin D deficiency, unspecified: Secondary | ICD-10-CM | POA: Diagnosis not present

## 2021-02-06 DIAGNOSIS — Z0000 Encounter for general adult medical examination without abnormal findings: Secondary | ICD-10-CM | POA: Diagnosis not present

## 2021-02-06 DIAGNOSIS — Z719 Counseling, unspecified: Secondary | ICD-10-CM | POA: Diagnosis not present

## 2021-02-06 DIAGNOSIS — F17210 Nicotine dependence, cigarettes, uncomplicated: Secondary | ICD-10-CM | POA: Diagnosis not present

## 2021-02-06 DIAGNOSIS — Z01419 Encounter for gynecological examination (general) (routine) without abnormal findings: Secondary | ICD-10-CM | POA: Diagnosis not present

## 2021-02-06 DIAGNOSIS — I10 Essential (primary) hypertension: Secondary | ICD-10-CM | POA: Diagnosis not present

## 2021-02-06 DIAGNOSIS — E538 Deficiency of other specified B group vitamins: Secondary | ICD-10-CM | POA: Diagnosis not present

## 2021-02-06 DIAGNOSIS — F17200 Nicotine dependence, unspecified, uncomplicated: Secondary | ICD-10-CM | POA: Diagnosis not present

## 2021-02-06 DIAGNOSIS — Z1389 Encounter for screening for other disorder: Secondary | ICD-10-CM | POA: Diagnosis not present

## 2021-02-06 DIAGNOSIS — Z79899 Other long term (current) drug therapy: Secondary | ICD-10-CM | POA: Diagnosis not present

## 2021-02-06 DIAGNOSIS — G35 Multiple sclerosis: Secondary | ICD-10-CM | POA: Diagnosis not present

## 2021-02-06 DIAGNOSIS — Z1331 Encounter for screening for depression: Secondary | ICD-10-CM | POA: Diagnosis not present

## 2021-02-06 DIAGNOSIS — Z124 Encounter for screening for malignant neoplasm of cervix: Secondary | ICD-10-CM | POA: Diagnosis not present

## 2021-02-06 DIAGNOSIS — Z6820 Body mass index (BMI) 20.0-20.9, adult: Secondary | ICD-10-CM | POA: Diagnosis not present

## 2021-03-06 NOTE — Progress Notes (Signed)
PATIENT: [name] DOB: 1956-04-26  REASON FOR VISIT: follow up HISTORY FROM: patient  Chief Complaint  Patient presents with   Obstructive Sleep Apnea    Rm 10, alone. Here for MS f/u, on Copaxone. Pt reports doing well. Pt had MRI's done in April at [place]. On care everywhere.       HISTORY OF PRESENT ILLNESS: 03/07/21 ALL: [name] returns for MS follow up. She continues Capaxone injections. Labs have been stable with PCP. Last MRI brain and cervical spine 09/2020 with [facility] were stable.   She reports doing well, today. No new or exacerbating symptoms. She has recovered from previous diverticulitis episode last year. She did lose about 20 pounds but feels weight is stable. She feels mood is good on Viibryd. She is retired. She sleeps for about 4-5 hours at a time but then may take a nap later in the day. She denies changes in vision, bowel or bladder habits. She uses Nexium very sparingly. She is interested in coming off [place]. She is smoking and reports PCP keeps pushing cessation.   11/23/2019 ALL: [name] is a 65 y.o. female here today for follow up for MS. She continues Capaxone injections as prescribed. She denies new or worsening symptoms. No exacerbating symptoms. She continues to have lower extremity weakness, L> R but reports this is unchanged. No changes in gait. No falls. No assistive devices. She has intermittent vertigo symptoms but reports this has been unchanged for years. She continues Viibryd for mood stabilization and feels it works very well. She does continue Nexium most days. She has not take it in the past two weeks. She was seen by the ER on 5/24 for diverticulitis. NA 128 and K 3.4. Creatinine normal. She has been seen by PCP and GI since and labs are now normal according to patient. She is feeling well today and without complaints.   01/11/2019 ALL: [name] is a 65 y.o. female here today for follow up for MS. she reports that she is doing very well.  She  continues Copaxone on Monday, Thursday and Sundays.  She is tolerating medication well.  She denies any new symptoms, specifically numbness, changes in vision, changes in gait or changes in bowel or bladder function.  She does state that heat makes her feel very tired.  She is trying to avoid being out in the heat for prolonged period of time.  She does continue Nexium nearly daily for acid reflux.  She is also taking Viibryd 10 mg daily for anxiety.  These medications have previously been refilled by Dr. [name].  She has been advised to avoid daily use of Nexium in the past.   Last MRI in April 2019 was stable.   History (copied from Dr Dohmeier's note on 09/08/2017)   HPI:  [name] is a 65 y.o. female , who was originally seen here as a referral from Dr. Andris Flurry at the offices of  Dr. Hilma Favors , MD for a transfer of care for multiple sclerosis.  Mrs Gentzel reports that she received the diagnosis of multiple sclerosis at age 40. She was diagnosed by Dr. Arsenio Katz, after years of having symptoms that included skin dysesthesias , numbness and right eye vision loss or blurring of vision- Soon afterwards she was seen by Dr. Dellis Filbert ,a multiple sclerosis specialist and followed him from the [facility] office to the [facility] office. She is now looking for followup care , also within the network of  her other physicians.  Original diagnosis was established by clinical history, abnormal brain MRI and a spinal tap positive for oligoclonal bands. A copy of one of her last MRI is is available to me. Her brain MRI with and without contrast was compared to a study from 2012 and documented only  minimal  Disease progression. The patient had mild ventricular enlargement unchanged for the last 3 years. Multiple areas of increased white matter signal in the periventricular space, significant involvement of the corpus callosum and classic Dawson's fingers. Involvement of white matter demyelination in the temporal area. Dr.  Starleen Blue interpreted the study as  moderate to severe disease burden , referenced  to her long-standing multiple sclerosis./ The patient's most recent blood test results were also attached ( referral papers)- she has a normal white blood cell count she's not anemic, there is  normal kidney function ,normal liver function and  she is not diabetic. She's currently controlled on Copaxone  with 40 MG 3 TIMES A WEEK THE PATIENT SWITCHED LAST SUMMER FROM daily to the 3 Times Weekly Formulation.  She has Relapsing Remitting Multiple Sclerosis , she has noticed neither  clinical changes nor side effects since switching in the formulation.    06-20-14  Mrs. [name] has been stable neurologically and overall since our last visit. She has changed to the 3 times weekly form of Copaxone without any problems. She already received her pneumonia shot and her flu shot for this year. She resides in [place] and the community has been suffering under a viral infection wave this winter with the respiratory with respiratory symptoms as well as myalgia. She has so f/ar done very well. She has no recent blood test to review. She moved to a one level apartment , first floor and is happy with the new neighborhood. She lives alone with a cat.    01-03-2015. Mrs. [name] is here for her regular routine and refill visit. She had no evidence of any relapses she remains hypertensive height per recent flexible sigmoidoscopy but was normal Babinski responses and she has some proximal muscle rigidity but no cogwheeling associated with it all this speaks for a mild progressive form of MS for progression has been very slow and she has been responding well to the medications. This is her 17 year anniversary of the diagnosis of MS.   01-02-2016, [name] has not felt any impairment, no relapse or remitting symptoms of MS. We will repeat a brain MRI. Her original diagnosis was established by clinical history abnormal brain MRI and oligoclonal  bands in her cerebral spinal fluid. We discussed today when she can have her MRI and she would like it in November or even December of this year at [place] location. She would like an open MRI.   Interval history from 01/06/2017 for this established 65 year old Caucasian patient [name], who carries a diagnosis of multiple sclerosis. Heat and humidity affect her. The patient is currently treated with Copaxone 40 mg 3 times a week and has had no evidence of a relapse since. She has been stable for the last 4 years. Her diagnosis was established on 7-15- 2006. She drives, lives independent and alone and is able to perform all activities of daily living, handles her own finances.I like for her to have a new MRI - she would like to have it at [place].    09-08-2017, R for this established patient with MS. Now due for MRI - brain and c spine.  She insists on open MRI . She fell  while brooming off cobwebs from the ceiling and broke her left humerus. Went to ED 02-06-2017 , surgery 02-16-2017. She now has a metal plate.    REVIEW OF SYSTEMS: Out of a complete 14 system review of symptoms, the patient complains only of the following symptoms, diverticulitis, GERD, anxiety, and all other reviewed systems are negative.  ALLERGIES: Allergies  Allergen Reactions   Sulfa Antibiotics     Unsure    Codeine Nausea And Vomiting   Dilaudid [Hydromorphone Hcl] Nausea And Vomiting    Zofran helped    HOME MEDICATIONS: Outpatient Medications Prior to Visit  Medication Sig Dispense Refill   aspirin 81 MG chewable tablet Chew 1 tablet (81 mg total) by mouth 3 (three) times a week.     Cholecalciferol 50 MCG (2000 UT) CAPS Take 2,000 Units by mouth daily.      COPAXONE 40 MG/ML SOSY INJECT '40MG'$  SUBCUTANEOUSLY  THREE TIMES WEEKLY AT LEAST 48 HOURS APART 12 mL 5   Cyanocobalamin (VITAMIN B-12) 3000 MCG SUBL Place 3,000 mcg under the tongue daily.      diazepam (VALIUM) 5 MG tablet Take 2.5 mg  by mouth at bedtime as needed (sleep).      esomeprazole (NEXIUM) 40 MG capsule TAKE 1 CAPSULE BY MOUTH DAILY AT 12 NOON 90 capsule 3   losartan (COZAAR) 50 MG tablet Take 50 mg by mouth daily.      Probiotic Product (PROBIOTIC PO) Take 1 capsule by mouth daily.     Psyllium (METAMUCIL FIBER PO) Take 2 Scoops by mouth at bedtime.      Vilazodone HCl (VIIBRYD) 10 MG TABS TAKE 1 TABLET BY MOUTH DAILY OR AS DIRECTED BY DOCTOR 90 tablet 3   fluorometholone (FML) 0.1 % ophthalmic suspension Place 1 drop into both eyes in the morning, at noon, and at bedtime. For 2 weeks     No facility-administered medications prior to visit.    PAST MEDICAL HISTORY: Past Medical History:  Diagnosis Date   Arthritis    Osteoarthritis   Colon polyps    GERD (gastroesophageal reflux disease)    Hiatal hernia    Hypertension    MS (multiple sclerosis) ([place]) 12/09/2013   Multiple sclerosis ([place])    Seizures ([place]) 01/04/2005   1 and Only    PAST SURGICAL HISTORY: Past Surgical History:  Procedure Laterality Date   COLONOSCOPY     COLONOSCOPY N/A 09/16/2012   Procedure: COLONOSCOPY;  Surgeon: Rogene Houston, MD;  [facility];  Service: Endoscopy;  Laterality: N/A;  1030-rescheduled to 9:30 Ann notified pt   COLONOSCOPY N/A 01/05/2020   Procedure: COLONOSCOPY;  Surgeon: Rogene Houston, MD;  [facility];  Service: Endoscopy;  Laterality: N/A;  [place]     ORIF HUMERUS FRACTURE Left 02/16/2017   Procedure: OPEN REDUCTION INTERNAL FIXATION (ORIF) LEFT PROXIMAL HUMERUS FRACTURE;  Surgeon: Meredith Pel, MD;  Location: [facility];  Service: Orthopedics;  Laterality: Left;   POLYPECTOMY  01/05/2020   Procedure: POLYPECTOMY;  Surgeon: Rogene Houston, MD;  [facility];  Service: Endoscopy;;    FAMILY HISTORY: Family History  Problem Relation Age of Onset   Stomach cancer Father    Sick sinus syndrome Father    Colon cancer Neg Hx      SOCIAL HISTORY: Social History   Socioeconomic History   Marital status: Single  Spouse name: Not on file   Number of children: 0   Years of education: College   Highest education level: Not on file  Occupational History    Employer: DISABLED  Tobacco Use   Smoking status: Every Day    Packs/day: 0.75    Years: 30.00    Pack years: 22.50    Types: Cigarettes   Smokeless tobacco: Never  Vaping Use   Vaping Use: Never used  Substance and Sexual Activity   Alcohol use: Yes    Alcohol/week: 8.0 standard drinks    Types: 8 Cans of beer per week    Comment: 4-5 beers daily    Drug use: No   Sexual activity: Not on file  Other Topics Concern   Not on file  Social History Narrative   Patient is single and lives alone.   Patient has a college education   Patient is right-handed.   Patient is retired.   Patient drinks three cups of coffee daily.   Social Determinants of Health   Financial Resource Strain: Not on file  Food Insecurity: Not on file  Transportation Needs: Not on file  Physical Activity: Not on file  Stress: Not on file  Social Connections: Not on file  Intimate Partner Violence: Not on file      PHYSICAL EXAM  Vitals:   03/07/21 1052  BP: 132/75  Pulse: 70  Weight: 119 lb 8 oz (54.2 kg)  Height: '5\' 6"'$  (1.676 m)    Body mass index is 19.29 kg/m.  Generalized: Well developed, in no acute distress  Cardiology: normal rate and rhythm, no murmur noted Respiratory: clear to auscultation bilaterally  Neurological examination  Mentation: Alert oriented to time, place, history taking. Follows all commands speech and language fluent Cranial nerve II-XII: Pupils were equal round reactive to light. Extraocular movements were full, visual field were full on confrontational test. Facial sensation and strength were normal. Uvula tongue midline. Head turning and shoulder shrug  were normal and symmetric. Motor: The motor testing reveals 5 over 5  strength of bilateral upper extremities, 4+/5 left hip flexion, 5/5 right lower. Good symmetric motor tone is noted throughout.  Sensory: Sensory testing is intact to soft touch on all 4 extremities. No evidence of extinction is noted.  Coordination: Cerebellar testing reveals good finger-nose-finger and heel-to-shin bilaterally.  Gait and station: Gait is normal. Tandem gait is stable.  Reflexes: Deep tendon reflexes are symmetric and normal bilaterally of upper extremities, brisk but symmetric in bilateral lower pattellar and suprapatellar.   DIAGNOSTIC DATA (LABS, IMAGING, TESTING) - I reviewed patient records, labs, notes, testing and imaging myself where available.  No flowsheet data found.   Lab Results  Component Value Date   WBC 7.6 11/14/2019   HGB 16.6 (H) 11/14/2019   HCT 49.0 (H) 11/14/2019   MCV 98.0 11/14/2019   PLT 257 11/14/2019      Component Value Date/Time   NA 128 (L) 11/14/2019 1214   NA 142 09/08/2017 1255   K 3.4 (L) 11/14/2019 1214   CL 91 (L) 11/14/2019 1214   CO2 25 11/14/2019 1214   GLUCOSE 101 (H) 11/14/2019 1214   BUN 5 (L) 11/14/2019 1214   BUN 9 09/08/2017 1255   CREATININE 0.68 11/14/2019 1214   CALCIUM 9.7 11/14/2019 1214   PROT 7.7 11/14/2019 1214   PROT 7.1 09/08/2017 1255   ALBUMIN 4.9 11/14/2019 1214   ALBUMIN 4.6 09/08/2017 1255   AST 43 (H) 11/14/2019 1214  ALT 44 11/14/2019 1214   ALKPHOS 64 11/14/2019 1214   BILITOT 0.8 11/14/2019 1214   BILITOT 0.5 09/08/2017 1255   GFRNONAA >60 11/14/2019 1214   GFRAA >60 11/14/2019 1214   No results found for: CHOL, HDL, LDLCALC, LDLDIRECT, TRIG, CHOLHDL No results found for: HGBA1C No results found for: VITAMINB12 No results found for: TSH     ASSESSMENT AND PLAN 65 y.o. year old female  has a past medical history of Arthritis, Colon polyps, GERD (gastroesophageal reflux disease), Hiatal hernia, Hypertension, MS (multiple sclerosis) ([place]) (12/09/2013), Multiple sclerosis ([place]), and  Seizures ([place]) (01/04/2005). here with     ICD-10-CM   1. MS (multiple sclerosis) ([place])  G35        Azyia is doing well today.  She denies any new or worsening symptoms.  We will continue Copaxone injections as prescribed.  She will continue Viibryd daily as this is helping with anxiety and depression. She may continue Nexium PRN but advised against regular usage. Labs and MRI stable. She will call me with any new or worsening symptoms. She will return to see Dr [name] in 6-8 months. May consider discontinuing Capaxone pending Dr Dohmeier's visit. She verbalizes understanding and agreement with this plan.   No orders of the defined types were placed in this encounter.    Meds ordered this encounter  Medications   Vilazodone HCl (VIIBRYD) 10 MG TABS    Sig: TAKE 1 TABLET BY MOUTH DAILY OR AS DIRECTED BY DOCTOR    Dispense:  90 tablet    Refill:  3    Order Specific Question:   Supervising Provider    Answer:   Melvenia Beam [id number]       I spent 15 minutes with the patient. 50% of this time was spent counseling and educating patient on plan of care and medications.    Debbora Presto, FNP-C 03/07/2021, 11:26 AM [facility] [address] [place], [place] [address] [phone]

## 2021-03-06 NOTE — Patient Instructions (Signed)
Below is our plan:  We will continue Capaxone, Viibryd daily and Nexium as needed.   Please make sure you are staying well hydrated. I recommend 50-60 ounces daily. Well balanced diet and regular exercise encouraged. Consistent sleep schedule with 6-8 hours recommended.   Please continue follow up with care team as directed.   Follow up with Dr [name] in 6 months   You may receive a survey regarding today's visit. I encourage you to leave honest feed back as I do use this information to improve patient care. Thank you for seeing me today!

## 2021-03-07 ENCOUNTER — Encounter: Payer: Self-pay | Admitting: Family Medicine

## 2021-03-07 ENCOUNTER — Ambulatory Visit: Payer: Medicare PPO | Admitting: Family Medicine

## 2021-03-07 VITALS — BP 132/75 | HR 70 | Ht 66.0 in | Wt 119.5 lb

## 2021-03-07 DIAGNOSIS — G35 Multiple sclerosis: Secondary | ICD-10-CM

## 2021-03-07 MED ORDER — VILAZODONE HCL 10 MG PO TABS: ORAL_TABLET | ORAL | 3 refills | Status: DC

## 2021-03-12 ENCOUNTER — Encounter: Payer: Self-pay | Admitting: Neurology

## 2021-03-12 ENCOUNTER — Telehealth: Payer: Self-pay | Admitting: Neurology

## 2021-03-12 DIAGNOSIS — F32A Depression, unspecified: Secondary | ICD-10-CM | POA: Insufficient documentation

## 2021-03-12 NOTE — Telephone Encounter (Signed)
PA completed on [employer] KEY: [id number] PA Approved immediately.PA Case: [id number], Status: Approved, Coverage Starts on: 06/23/2020 12:00:00 AM, Coverage Ends on: 06/22/2021 12:00:00 AM

## 2021-04-23 DIAGNOSIS — Z23 Encounter for immunization: Secondary | ICD-10-CM | POA: Diagnosis not present

## 2021-07-01 DIAGNOSIS — H04123 Dry eye syndrome of bilateral lacrimal glands: Secondary | ICD-10-CM | POA: Diagnosis not present

## 2021-07-29 DIAGNOSIS — Z681 Body mass index (BMI) 19 or less, adult: Secondary | ICD-10-CM | POA: Diagnosis not present

## 2021-07-29 DIAGNOSIS — R350 Frequency of micturition: Secondary | ICD-10-CM | POA: Diagnosis not present

## 2021-10-08 ENCOUNTER — Other Ambulatory Visit (HOSPITAL_COMMUNITY): Payer: Self-pay | Admitting: Family Medicine

## 2021-10-08 DIAGNOSIS — Z1231 Encounter for screening mammogram for malignant neoplasm of breast: Secondary | ICD-10-CM

## 2021-10-10 ENCOUNTER — Encounter: Payer: Self-pay | Admitting: Neurology

## 2021-10-10 ENCOUNTER — Ambulatory Visit: Payer: Medicare PPO | Admitting: Neurology

## 2021-10-10 VITALS — BP 143/75 | HR 72 | Ht 66.0 in | Wt 113.5 lb

## 2021-10-10 DIAGNOSIS — G4700 Insomnia, unspecified: Secondary | ICD-10-CM

## 2021-10-10 DIAGNOSIS — G35 Multiple sclerosis: Secondary | ICD-10-CM | POA: Diagnosis not present

## 2021-10-10 DIAGNOSIS — Z79899 Other long term (current) drug therapy: Secondary | ICD-10-CM | POA: Insufficient documentation

## 2021-10-10 DIAGNOSIS — Z7189 Other specified counseling: Secondary | ICD-10-CM | POA: Insufficient documentation

## 2021-10-10 DIAGNOSIS — K2100 Gastro-esophageal reflux disease with esophagitis, without bleeding: Secondary | ICD-10-CM

## 2021-10-10 MED ORDER — GLATIRAMER ACETATE 40 MG/ML ~~LOC~~ SOSY: PREFILLED_SYRINGE | SUBCUTANEOUS | 5 refills | Status: DC

## 2021-10-10 NOTE — Addendum Note (Signed)
Addended by: Larey Seat on: 10/10/2021 11:39 AM ? ? Modules accepted: Orders ? ?

## 2021-10-10 NOTE — Addendum Note (Signed)
Addended by: Larey Seat on: 10/10/2021 11:37 AM ? ? Modules accepted: Orders ? ?

## 2021-10-10 NOTE — Patient Instructions (Signed)
[place] -  pill form and only used for limited period in year 1 and year 2 and you will be done. !  ? ? ? ?

## 2021-10-10 NOTE — Progress Notes (Signed)
[facility] ? ?Provider:  Larey Seat, M D  ?Referring Provider: Sharilyn Sites, MD ?Primary Care Physician:  Sharilyn Sites, MD ? ?Chief Complaint  ?Patient presents with  ? Follow-up  ?  RM 10, alone. 6 month f/u. Last seen 03/07/21 by AL,NP. MS DMT: Copaxone. Doing well, no new sx or concerns.   ? ? ?HPI:  [name] is a 66 y.o. female , who was originally seen here as a referral from [facility] at [facility] and Dr Starleen Blue, [place] in [place] - ?PCP Dr. Hilma Favors , MD , and seen first in 2014 for a transfer of care for multiple sclerosis.  ?[name] reports that she received the diagnosis of multiple sclerosis at age 21. She was diagnosed by Dr. Arsenio Katz, after years of having symptoms that included skin dysesthesias , numbness and right eye vision loss or blurring of vision- Soon afterwards she was seen by Dr. Dellis Filbert ,a multiple sclerosis specialist and followed him from the [facility] office to the [facility] office. She is now looking for followup care , also within the network of her other physicians.  ?Original diagnosis was established by clinical history, abnormal brain MRI and a spinal tap positive for oligoclonal bands. A copy of one of her last MRI is is available to me. Her brain MRI with and without contrast was compared to a study from 2012 and documented only  minimal  Disease progression. ?The patient had mild ventricular enlargement unchanged for the last 3 years. Multiple areas of increased white matter signal in the periventricular space, significant involvement of the corpus callosum and classic Dawson's fingers. Involvement of white matter demyelination in the temporal area. Dr. Starleen Blue interpreted the study as  moderate to severe disease burden , referenced  to her long-standing multiple sclerosis. ?The patient's most recent blood test results were also attached ( referral papers)- she has a normal white blood cell count she's not anemic, there is  normal kidney function ,normal liver  function and  she is not diabetic. ?She's currently controlled on Copaxone  with 40 MG 3 TIMES A WEEK ?THE PATIENT SWITCHED LAST SUMMER FROM daily to the 3 Times Weekly Formulation.  ?She has Relapsing Remitting Multiple Sclerosis , she has noticed neither  clinical changes nor side effects since switching in the formulation.  ? ? 10-10-2021: CD RV for [name] , a meanwhile 66 year old caucasian MS patient  who has done very well on Copaxone for almost a decade.  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  She was diagnosed July 15th 2006 with MS.  ?RM 10, alone. 6 month f/u. Last seen 03/07/21 by AL,NP. MS DMT: Copaxone. Doing well, no new sx or concerns. MRI showed no active lesions , no relapses.  ?She reported some bowel problems, diverticulitis, polyposis coli, and struggles with diarrhea.  She still smokes. She lost weight due to her GI problems, BMI is 18.  ? ? ?IMPRESSION:  This MRI of the brain with and without contrast shows the following: ?1.   Scattered T2/FLAIR hyperintense foci, predominantly in the periventricular white matter of both hemispheres in a pattern and configuration consistent with chronic demyelinating plaque associated with multiple sclerosis. None of the foci appears to be acute. ?2.    When compared to the MRI dated 07/05/2014, there is no interval change. ?3.    There is a normal enhancement pattern and there are no acute findings. ? ?INTERPRETING PHYSICIAN:  ?Richard A. Sater,  MD, PhD ? ?Last brain MRI 09-2020 at [facility]  ?IMPRESSION:  ? ?Chronic periventricular and subcortical demyelinating lesions with no acute features and no change compared with October 14, 2017.  ? ? ? ?Electronically Signed by: Michael Boston on 10/18/2020 2:26 PM ?Narrative ? ?EXAMINATION: Brain MRI with and without contrast  ? ?CLINICAL INDICATION: Multiple sclerosis  ? ?TECHNIQUE: MRI brain protocol with and without contrast. Postcontrast images with 6 ml Gadavist  ? ?COMPARISON: 10/14/2017   ? ?FINDINGS:  ? ?Again noted are multiple periventricular lesions with multiple areas of encephalomalacia. There are several subcortical lesions. None of the lesions are new. There is no enhancement.  ? ?There is no abnormal contrast enhancement.  ? ? ?06-20-14  [name] has been stable neurologically and overall since our last visit. She has changed to the 3 times weekly form of Copaxone without any problems. She already received her pneumonia shot and her flu shot for this year. She resides in [place] and the community has been suffering under a viral infection wave this winter with the respiratory with respiratory symptoms as well as myalgia. She has so far done very well. ?She has no recent blood test to review. She moved to a one level apartment , first floor and is happy with the new neighborhood. She lives alone with a cat.  ? ?01-03-2015. ?[name] is here for her regular routine and refill visit. She had no evidence of any relapses she remains hypertensive height per recent flexible sigmoidoscopy but was normal Babinski responses and she has some proximal muscle rigidity but no cogwheeling associated with it all this speaks for a mild progressive form of MS for progression has been very slow and she has been responding well to the medications. This is her 9 year anniversary of the diagnosis of MS. ? ?01-02-2016, ?[name] has not felt any impairment, no relapse or remitting symptoms of MS. We will repeat a brain MRI. Her original diagnosis was established by clinical history abnormal brain MRI and oligoclonal bands in her cerebral spinal fluid. ?We discussed today when she can have her MRI and she would like it in November or even December of this year at [place] location. She would like an open MRI. ? ?Interval history from 01/06/2017 for this established 66 year old Caucasian patient [name], who carries a diagnosis of multiple sclerosis. Heat and humidity affect her. The  patient is currently treated with Copaxone 40 mg 3 times a week and has had no evidence of a relapse since. She has been stable for the last 4 years. ?Her diagnosis was established on 7-15- 2006. She drives, lives independent and alone and is able to perform all activities of daily living, handles her own finances.I like for her to have a new MRI - she would like to have it at [place].  ? ?09-08-2017, R for this established patient with MS. Now due for MRI - brain and c spine. She insists on open MRI . She fell  while brooming off cobwebs from the ceiling and broke her left humerus. Went to ED 02-06-2017 , surgery 02-16-2017. She now has a metal plate.  ? ? ? ?Review of Systems: ?Out of a complete 14 system review, the patient complains of only the following symptoms, and all other reviewed systems are negative. ? ?Patient feels hot and weeker in humidity. Still smoking - 1 ppd. Broke left arm at shoulder in 01-2017.  ? ? ? ?Social History  ? ?Socioeconomic History  ?  Marital status: Single  ?  Spouse name: Not on file  ? Number of children: 0  ? Years of education: College  ? Highest education level: Not on file  ?Occupational History  ?  Employer: DISABLED  ?Tobacco Use  ? Smoking status: Every Day  ?  Packs/day: 0.75  ?  Years: 30.00  ?  Pack years: 22.50  ?  Types: Cigarettes  ? Smokeless tobacco: Never  ?Vaping Use  ? Vaping Use: Never used  ?Substance and Sexual Activity  ? Alcohol use: Yes  ?  Alcohol/week: 8.0 standard drinks  ?  Types: 8 Cans of beer per week  ?  Comment: 4-5 beers daily   ? Drug use: No  ? Sexual activity: Not on file  ?Other Topics Concern  ? Not on file  ?Social History Narrative  ? Patient is single and lives alone.  ? Patient has a college education  ? Patient is right-handed.  ? Patient is retired.  ? Patient drinks three cups of coffee daily.  ? ?Social Determinants of Health  ? ?Financial Resource Strain: Not on file  ?Food Insecurity: Not on file  ?Transportation Needs: Not on file   ?Physical Activity: Not on file  ?Stress: Not on file  ?Social Connections: Not on file  ?Intimate Partner Violence: Not on file  ? ? ?Family History  ?Problem Relation Age of Onset  ? Stomach cancer Fat

## 2021-10-14 ENCOUNTER — Encounter (HOSPITAL_COMMUNITY): Payer: Self-pay

## 2021-10-14 ENCOUNTER — Inpatient Hospital Stay (HOSPITAL_COMMUNITY): Admission: RE | Admit: 2021-10-14 | Payer: Medicare PPO | Source: Ambulatory Visit

## 2021-10-14 ENCOUNTER — Encounter: Payer: Self-pay | Admitting: Neurology

## 2021-10-14 DIAGNOSIS — Z1231 Encounter for screening mammogram for malignant neoplasm of breast: Secondary | ICD-10-CM

## 2021-10-15 LAB — CBC WITH DIFFERENTIAL/PLATELET
Basophils Absolute: 0.1 10*3/uL (ref 0.0–0.2)
Basos: 1 %
EOS (ABSOLUTE): 0 10*3/uL (ref 0.0–0.4)
Eos: 0 %
Hematocrit: 43.4 % (ref 34.0–46.6)
Hemoglobin: 15.6 g/dL (ref 11.1–15.9)
Immature Grans (Abs): 0 10*3/uL (ref 0.0–0.1)
Immature Granulocytes: 0 %
Lymphocytes Absolute: 1.8 10*3/uL (ref 0.7–3.1)
Lymphs: 22 %
MCH: 35.8 pg — ABNORMAL HIGH (ref 26.6–33.0)
MCHC: 35.9 g/dL — ABNORMAL HIGH (ref 31.5–35.7)
MCV: 100 fL — ABNORMAL HIGH (ref 79–97)
Monocytes Absolute: 0.4 10*3/uL (ref 0.1–0.9)
Monocytes: 5 %
Neutrophils Absolute: 5.9 10*3/uL (ref 1.4–7.0)
Neutrophils: 72 %
Platelets: 286 10*3/uL (ref 150–450)
RBC: 4.36 x10E6/uL (ref 3.77–5.28)
RDW: 11.8 % (ref 11.7–15.4)
WBC: 8.3 10*3/uL (ref 3.4–10.8)

## 2021-10-15 LAB — COMPREHENSIVE METABOLIC PANEL
ALT: 23 IU/L (ref 0–32)
AST: 29 IU/L (ref 0–40)
Albumin/Globulin Ratio: 2.3 — ABNORMAL HIGH (ref 1.2–2.2)
Albumin: 4.8 g/dL (ref 3.8–4.8)
Alkaline Phosphatase: 83 IU/L (ref 44–121)
BUN/Creatinine Ratio: 13 (ref 12–28)
BUN: 9 mg/dL (ref 8–27)
Bilirubin Total: 0.5 mg/dL (ref 0.0–1.2)
CO2: 24 mmol/L (ref 20–29)
Calcium: 10.1 mg/dL (ref 8.7–10.3)
Chloride: 99 mmol/L (ref 96–106)
Creatinine, Ser: 0.69 mg/dL (ref 0.57–1.00)
Globulin, Total: 2.1 g/dL (ref 1.5–4.5)
Glucose: 99 mg/dL (ref 70–99)
Potassium: 4.7 mmol/L (ref 3.5–5.2)
Sodium: 137 mmol/L (ref 134–144)
eGFR: 97 mL/min/{1.73_m2} (ref >59)

## 2021-10-15 LAB — PE AND FLC, SERUM
A/G Ratio: 1.8 — ABNORMAL HIGH (ref 0.7–1.7)
Albumin ELP: 4.4 g/dL (ref 2.9–4.4)
Alpha 1: 0.2 g/dL (ref 0.0–0.4)
Alpha 2: 0.7 g/dL (ref 0.4–1.0)
Beta: 0.9 g/dL (ref 0.7–1.3)
Gamma Globulin: 0.7 g/dL (ref 0.4–1.8)
Globulin, Total: 2.5 g/dL (ref 2.2–3.9)
Ig Kappa Free Light Chain: 9.8 mg/L (ref 3.3–19.4)
Ig Lambda Free Light Chain: 7.9 mg/L (ref 5.7–26.3)
KAPPA/LAMBDA RATIO: 1.24 (ref 0.26–1.65)
Total Protein: 6.9 g/dL (ref 6.0–8.5)

## 2021-10-15 LAB — LIPID PANEL
Chol/HDL Ratio: 1.8 ratio (ref 0.0–4.4)
Cholesterol, Total: 187 mg/dL (ref 100–199)
HDL: 105 mg/dL (ref >39)
LDL Chol Calc (NIH): 71 mg/dL (ref 0–99)
Triglycerides: 54 mg/dL (ref 0–149)
VLDL Cholesterol Cal: 11 mg/dL (ref 5–40)

## 2021-10-16 ENCOUNTER — Ambulatory Visit (HOSPITAL_COMMUNITY): Payer: Medicare PPO

## 2021-10-28 ENCOUNTER — Ambulatory Visit (HOSPITAL_COMMUNITY)
Admission: RE | Admit: 2021-10-28 | Discharge: 2021-10-28 | Disposition: A | Discharge status: Home / Self Care | Payer: Medicare PPO | Source: Ambulatory Visit | Attending: Family Medicine | Admitting: Family Medicine

## 2021-10-28 DIAGNOSIS — Z1231 Encounter for screening mammogram for malignant neoplasm of breast: Secondary | ICD-10-CM | POA: Diagnosis not present

## 2022-02-25 ENCOUNTER — Telehealth: Payer: Self-pay | Admitting: *Deleted

## 2022-02-25 ENCOUNTER — Encounter: Payer: Self-pay | Admitting: *Deleted

## 2022-02-25 NOTE — Patient Outreach (Signed)
  Care Coordination   Initial Visit Note   02/25/2022 Name: [name] MRN: [id number] DOB: December 24, 1955  [name] is a 66 y.o. year old female who sees Sharilyn Sites, MD for primary care. I spoke with  [name] by phone today.  What matters to the patients health and wellness today?  Ongoing self health management    Goals Addressed             This Visit's Progress    COMPLETED: Care Coordination Services (no follow-up required)       Care Coordination Interventions: Assessed social determinant of health barriers Assessed mobility and ability to perform ADLs Assessed family/Social support Provided patient/caregiver with verbal information on [place] [phone]) Encouraged patient to request a referral for [facility] [place] from PCP if services are needed in the future Reviewed medications and discussed affordability. Patient Has [employer] and [employer] secondary. No problems with medication cost.        SDOH assessments and interventions completed:  Yes  SDOH Interventions Today    Flowsheet Row Most Recent Value  SDOH Interventions   Housing Interventions Intervention Not Indicated  Transportation Interventions Intervention Not Indicated  Utilities Interventions Intervention Not Indicated  Financial Strain Interventions Intervention Not Indicated        Care Coordination Interventions Activated:  Yes  Care Coordination Interventions:  Yes, provided   Follow up plan: No further intervention required.   Encounter Outcome:  Pt. Visit Completed   Chong Sicilian, BSN, RN-BC [place] [facility] Pharmacist, community Dial: [phone]

## 2022-03-13 DIAGNOSIS — Z6820 Body mass index (BMI) 20.0-20.9, adult: Secondary | ICD-10-CM | POA: Diagnosis not present

## 2022-03-13 DIAGNOSIS — K5732 Diverticulitis of large intestine without perforation or abscess without bleeding: Secondary | ICD-10-CM | POA: Diagnosis not present

## 2022-04-17 DIAGNOSIS — Z681 Body mass index (BMI) 19 or less, adult: Secondary | ICD-10-CM | POA: Diagnosis not present

## 2022-04-17 DIAGNOSIS — F17200 Nicotine dependence, unspecified, uncomplicated: Secondary | ICD-10-CM | POA: Diagnosis not present

## 2022-04-17 DIAGNOSIS — Z0001 Encounter for general adult medical examination with abnormal findings: Secondary | ICD-10-CM | POA: Diagnosis not present

## 2022-04-17 DIAGNOSIS — Z1331 Encounter for screening for depression: Secondary | ICD-10-CM | POA: Diagnosis not present

## 2022-04-17 DIAGNOSIS — E782 Mixed hyperlipidemia: Secondary | ICD-10-CM | POA: Diagnosis not present

## 2022-04-17 DIAGNOSIS — E559 Vitamin D deficiency, unspecified: Secondary | ICD-10-CM | POA: Diagnosis not present

## 2022-04-17 DIAGNOSIS — G35 Multiple sclerosis: Secondary | ICD-10-CM | POA: Diagnosis not present

## 2022-04-17 DIAGNOSIS — E538 Deficiency of other specified B group vitamins: Secondary | ICD-10-CM | POA: Diagnosis not present

## 2022-04-17 DIAGNOSIS — E7849 Other hyperlipidemia: Secondary | ICD-10-CM | POA: Diagnosis not present

## 2022-04-17 DIAGNOSIS — I10 Essential (primary) hypertension: Secondary | ICD-10-CM | POA: Diagnosis not present

## 2022-04-17 DIAGNOSIS — K5732 Diverticulitis of large intestine without perforation or abscess without bleeding: Secondary | ICD-10-CM | POA: Diagnosis not present

## 2022-04-22 ENCOUNTER — Other Ambulatory Visit (HOSPITAL_COMMUNITY): Payer: Self-pay | Admitting: Family Medicine

## 2022-04-22 DIAGNOSIS — G35 Multiple sclerosis: Secondary | ICD-10-CM

## 2022-04-22 DIAGNOSIS — G35D Multiple sclerosis, unspecified: Secondary | ICD-10-CM

## 2022-05-03 ENCOUNTER — Encounter (INDEPENDENT_AMBULATORY_CARE_PROVIDER_SITE_OTHER): Payer: Self-pay | Admitting: Gastroenterology

## 2022-05-23 ENCOUNTER — Telehealth: Payer: Self-pay | Admitting: Neurology

## 2022-05-23 NOTE — Telephone Encounter (Signed)
Pt is requesting a refill for Vilazodone HCl (VIIBRYD) 10 MG TABS.  Pharmacy:  [facility] [phone]

## 2022-05-26 ENCOUNTER — Telehealth: Payer: Self-pay | Admitting: Neurology

## 2022-05-26 MED ORDER — VILAZODONE HCL 10 MG PO TABS: ORAL_TABLET | ORAL | 1 refills | Status: DC

## 2022-05-26 NOTE — Telephone Encounter (Signed)
Refill was e-scribed to [facility] first thing this morning, this is duplicate request.

## 2022-05-26 NOTE — Telephone Encounter (Signed)
E-scribed refill to pharmacy. 

## 2022-06-06 ENCOUNTER — Ambulatory Visit (HOSPITAL_COMMUNITY): Payer: Medicare PPO

## 2022-06-06 ENCOUNTER — Encounter (HOSPITAL_COMMUNITY): Payer: Self-pay

## 2022-08-07 ENCOUNTER — Ambulatory Visit (HOSPITAL_COMMUNITY)
Admission: RE | Admit: 2022-08-07 | Discharge: 2022-08-07 | Disposition: A | Discharge status: Home / Self Care | Payer: Medicare PPO | Source: Ambulatory Visit | Attending: Family Medicine | Admitting: Family Medicine

## 2022-08-07 DIAGNOSIS — J439 Emphysema, unspecified: Secondary | ICD-10-CM | POA: Diagnosis not present

## 2022-08-07 DIAGNOSIS — I700 Atherosclerosis of aorta: Secondary | ICD-10-CM | POA: Diagnosis not present

## 2022-08-07 DIAGNOSIS — K862 Cyst of pancreas: Secondary | ICD-10-CM | POA: Insufficient documentation

## 2022-08-07 DIAGNOSIS — J479 Bronchiectasis, uncomplicated: Secondary | ICD-10-CM | POA: Diagnosis not present

## 2022-08-07 DIAGNOSIS — G35 Multiple sclerosis: Secondary | ICD-10-CM | POA: Diagnosis not present

## 2022-08-07 DIAGNOSIS — I2510 Atherosclerotic heart disease of native coronary artery without angina pectoris: Secondary | ICD-10-CM | POA: Diagnosis not present

## 2022-08-13 ENCOUNTER — Emergency Department (HOSPITAL_COMMUNITY)
Admission: EM | Admit: 2022-08-13 | Discharge: 2022-08-13 | Discharge status: Against Medical Advice | Payer: Medicare PPO

## 2022-08-14 ENCOUNTER — Other Ambulatory Visit: Payer: Self-pay

## 2022-08-14 ENCOUNTER — Emergency Department (HOSPITAL_COMMUNITY)
Admission: RE | Admit: 2022-08-14 | Discharge: 2022-08-14 | Disposition: A | Discharge status: Home / Self Care | Payer: Medicare PPO | Source: Ambulatory Visit | Attending: Emergency Medicine | Admitting: Emergency Medicine

## 2022-08-14 ENCOUNTER — Emergency Department (HOSPITAL_COMMUNITY)
Admission: EM | Admit: 2022-08-14 | Discharge: 2022-08-14 | Disposition: A | Discharge status: Home / Self Care | Payer: Medicare PPO | Attending: Emergency Medicine | Admitting: Emergency Medicine

## 2022-08-14 ENCOUNTER — Encounter (HOSPITAL_COMMUNITY): Payer: Self-pay | Admitting: Emergency Medicine

## 2022-08-14 DIAGNOSIS — M79661 Pain in right lower leg: Secondary | ICD-10-CM | POA: Insufficient documentation

## 2022-08-14 DIAGNOSIS — Z7982 Long term (current) use of aspirin: Secondary | ICD-10-CM | POA: Diagnosis not present

## 2022-08-14 DIAGNOSIS — Z7901 Long term (current) use of anticoagulants: Secondary | ICD-10-CM | POA: Diagnosis not present

## 2022-08-14 DIAGNOSIS — M79604 Pain in right leg: Secondary | ICD-10-CM

## 2022-08-14 DIAGNOSIS — I82441 Acute embolism and thrombosis of right tibial vein: Secondary | ICD-10-CM | POA: Diagnosis not present

## 2022-08-14 MED ORDER — RIVAROXABAN (XARELTO) VTE STARTER PACK (15 & 20 MG): ORAL_TABLET | ORAL | 0 refills | Status: DC

## 2022-08-14 MED ORDER — RIVAROXABAN 15 MG PO TABS
15.0000 mg | ORAL_TABLET | Freq: Once | ORAL | Status: AC
  Administered 2022-08-14: 15 mg via ORAL
  Filled 2022-08-14: qty 1

## 2022-08-14 NOTE — ED Triage Notes (Signed)
C/o right calf pain since Sunday. Pt has had a DVT before and states feels similar. NAD

## 2022-08-14 NOTE — ED Provider Notes (Signed)
[facility] Provider Note   CSN: [id number] Arrival date & time: 08/14/22  0222     History  No chief complaint on file.   [name] is a 67 y.o. female.  67 yo F w/ h/o provoked DVT after a surgery presents to the ED today secondary to atraumatic pain of right calf similar to previous DVT feelings. No CP, SOB. No Leg erythema, edema. No recent car rides or procedures.         Home Medications Prior to Admission medications   Medication Sig Start Date End Date Taking? Authorizing Provider  RIVAROXABAN Alveda Reasons) VTE STARTER PACK (15 & 20 MG) Follow package directions: Take one 28m tablet by mouth twice a day. On day 22, switch to one 266mtablet once a day. Take with food. 08/14/22  Yes Caster Fayette, JaCorene CorneaMD  aspirin 81 MG chewable tablet Chew 1 tablet (81 mg total) by mouth 3 (three) times a week. 01/11/20   ReRogene HoustonMD  Cholecalciferol 50 MCG (2000 UT) CAPS Take 2,000 Units by mouth daily.     [provider]  Cyanocobalamin (VITAMIN B-12) 3000 MCG SUBL Place 3,000 mcg under the tongue daily.     [provider]  diazepam (VALIUM) 5 MG tablet Take 2.5 mg by mouth at bedtime as needed (sleep).     [provider]  Glatiramer Acetate (COPAXONE) 40 MG/ML SOSY INJECT 40MG SUBCUTANEOUSLY  THREE TIMES WEEKLY AT LEAST 48 HOURS APART 10/10/21   Dohmeier, CaAsencion PartridgeMD  losartan (COZAAR) 50 MG tablet Take 50 mg by mouth daily.     [provider]  Probiotic Product (PROBIOTIC PO) Take 1 capsule by mouth daily.    [provider]  Psyllium (METAMUCIL FIBER PO) Take 2 Scoops by mouth at bedtime.     [provider]  Vilazodone HCl (VIIBRYD) 10 MG TABS TAKE 1 TABLET BY MOUTH DAILY OR AS DIRECTED BY DOCTOR 05/26/22   Lomax, Amy, NP      Allergies    Sulfa antibiotics, Codeine, and Dilaudid [hydromorphone hcl]    Review of Systems   Review of Systems  Physical Exam Updated Vital  Signs BP 129/71   Pulse 75   Temp 97.9 F (36.6 C) (Oral)   Resp 16   Ht 5' 6"$  (1.676 m)   Wt 51.3 kg   SpO2 97%   BMI 18.24 kg/m  Physical Exam Vitals and nursing note reviewed.  Constitutional:      Appearance: She is well-developed.  HENT:     Head: Normocephalic and atraumatic.  Cardiovascular:     Rate and Rhythm: Normal rate and regular rhythm.  Pulmonary:     Effort: No respiratory distress.     Breath sounds: No stridor.  Abdominal:     General: There is no distension.  Musculoskeletal:     Cervical back: Normal range of motion.     Comments: TTP to R popliteal fossa area and just distal to same.  Neurological:     Mental Status: She is alert.     ED Results / Procedures / Treatments   Labs (all labs ordered are listed, but only abnormal results are displayed) Labs Reviewed - No data to display  EKG None  Radiology No results found.  Procedures Procedures    Medications Ordered in ED Medications  Rivaroxaban (XARELTO) tablet 15 mg (has no administration in time range)    ED Course/ Medical Decision Making/ A&P  Medical Decision Making Risk Prescription drug management.   H/o DVT so will get [place] later today. Dose of xarelto to be given prior to d/c in case it is positive. Of note she had an abnormal CT chest wo contrast recently in Epic but is not complaining of chest pain, shortness of breath, tachycardia, hypoxia or other things that would make me think she has a PE requiring imaging or workup at this time.    Final Clinical Impression(s) / ED Diagnoses Final diagnoses:  Pain of right lower extremity    Rx / DC Orders ED Discharge Orders          Ordered    RIVAROXABAN (XARELTO) VTE STARTER PACK (15 & 20 MG)        08/14/22 0315    US Venous Img Lower Unilateral Right        08/14/22 0315              Harlee Eckroth, Corene Cornea, MD 08/14/22 [phone]

## 2022-08-14 NOTE — ED Notes (Signed)
ED Provider at bedside. 

## 2022-08-14 NOTE — ED Provider Notes (Signed)
Patient returned for her right lower extremity ultrasound today, she has a confirmed DVT.  She has started her Xarelto as prescribed and will plan follow-up with her PCP within the next several days.  No further complaints at this time.   Joan Jefferson, PA-C 08/14/22 1453    Milton Ferguson, MD 08/15/22 [phone]

## 2022-08-14 NOTE — ED Notes (Signed)
Pt is able to ambulate but states that it is painful. No redness, tenderness, or swelling noted to right lower leg. Pt has full movement and no complaints when flexing foot.

## 2022-08-26 DIAGNOSIS — K862 Cyst of pancreas: Secondary | ICD-10-CM | POA: Diagnosis not present

## 2022-09-02 DIAGNOSIS — Z6820 Body mass index (BMI) 20.0-20.9, adult: Secondary | ICD-10-CM | POA: Diagnosis not present

## 2022-09-02 DIAGNOSIS — I82401 Acute embolism and thrombosis of unspecified deep veins of right lower extremity: Secondary | ICD-10-CM | POA: Diagnosis not present

## 2022-09-02 DIAGNOSIS — K869 Disease of pancreas, unspecified: Secondary | ICD-10-CM | POA: Diagnosis not present

## 2022-09-02 DIAGNOSIS — Z79899 Other long term (current) drug therapy: Secondary | ICD-10-CM | POA: Diagnosis not present

## 2022-10-07 NOTE — Progress Notes (Unsigned)
PATIENT: [name] DOB: 10/24/1955  REASON FOR VISIT: follow up HISTORY FROM: patient  No chief complaint on file.     HISTORY OF PRESENT ILLNESS:  10/07/22 ALL: [name] returns for follow up for MS. She was last seen by Joan [name] 09/2021 and continued to do well on Capaxone. Last MRI stable 2019.   She continues Viibryd.   10/10/2021 CD: HPI:  [name] is a 67 y.o. female , who was originally seen here as a referral from [name] at [name] , and seen first in 2014 for a transfer of care for multiple sclerosis.  [name] reports that she received the diagnosis of multiple sclerosis at age 59. She was diagnosed by [name], after years of having symptoms that included skin dysesthesias , numbness and right eye vision loss or blurring of vision- Soon afterwards she was seen by [name] ,a multiple sclerosis specialist and followed him from the [facility] office to the [name] office. She is now looking for followup care , also within the network of her other physicians.  Original diagnosis was established by clinical history, abnormal brain MRI and a spinal tap positive for oligoclonal bands. A copy of one of her last MRI is is available to me. Her brain MRI with and without contrast was compared to a Mclean from 2012 and documented only  minimal  Disease progression. The patient had mild ventricular enlargement unchanged for the last 3 years. Multiple areas of increased white matter signal in the periventricular space, significant involvement of the corpus callosum and classic Dawson's fingers. Involvement of white matter demyelination in the temporal area. [name] as  moderate to severe disease burden , referenced  to her long-standing multiple sclerosis. The patient's most recent blood test results were also attached ( referral papers)- she has a normal white blood cell count she's not anemic, there is   normal kidney function ,normal liver function and  she is not diabetic. She's currently controlled on Copaxone  with 40 MG 3 TIMES A WEEK THE PATIENT SWITCHED LAST SUMMER FROM daily to the 3 Times Weekly Formulation.  She has Relapsing Remitting Multiple Sclerosis , she has noticed neither  clinical changes nor side effects since switching in the formulation.    10-10-2021: CD RV for [name] , a meanwhile 67 year old caucasian MS patient  who has done very well on Copaxone for almost a decade.  She retired form the [name] after 30 years.  She was diagnosed July 15th 2006 with MS.  [name], alone. 6 month f/u. Last seen 03/07/21 by [name]. MS DMT: Copaxone. Doing well, no new sx or concerns. MRI showed no active lesions , no relapses.  She reported some bowel problems, diverticulitis, polyposis coli, and struggles with diarrhea.  She still smokes. She lost weight due to her GI problems, BMI is 18.   03/07/2021 ALL: [name] returns for MS follow up. She continues Capaxone injections. Labs have been stable with Joan. Last MRI brain and cervical spine 09/2020 with [facility] were stable.   She reports doing well, today. No new or exacerbating symptoms. She has recovered from previous diverticulitis episode last year. She did lose about 20 pounds but feels weight is stable. She feels mood is good on Viibryd. She is retired. She sleeps for about 4-5 hours at a time but then may take a  nap later in the day. She denies changes in vision, bowel or bladder habits. She uses Nexium very sparingly. She is interested in coming off Capxone. She is smoking and reports Joan keeps pushing cessation.   11/23/2019 ALL: [name] is a 67 y.o. female here today for follow up for MS. She continues Capaxone injections as prescribed. She denies new or worsening symptoms. No exacerbating symptoms. She continues to have lower extremity weakness, L> R but reports this is unchanged. No changes in gait. No falls. No  assistive devices. She has intermittent vertigo symptoms but reports this has been unchanged for years. She continues Viibryd for mood stabilization and feels it works very well. She does continue Nexium most days. She has not take it in the past two weeks. She was seen by the ER on 5/24 for diverticulitis. NA 128 and K 3.4. Creatinine normal. She has been seen by Joan and GI since and labs are now normal according to patient. She is feeling well today and without complaints.   01/11/2019 ALL: [name] is a 67 y.o. female here today for follow up for MS. she reports that she is doing very well.  She continues Copaxone on Monday, Thursday and Sundays.  She is tolerating medication well.  She denies any new symptoms, specifically numbness, changes in vision, changes in gait or changes in bowel or bladder function.  She does state that heat makes her feel very tired.  She is trying to avoid being out in the heat for prolonged period of time.  She does continue Nexium nearly daily for acid reflux.  She is also taking Viibryd Mclean mg daily for anxiety.  These medications have previously been refilled by Joan. Vickey Mclean.  She has been advised to avoid daily use of Nexium in the past.   Last MRI in April 2019 was stable.   History (copied from [name]'s note on 09/08/2017)   HPI:  [name] is a 67 y.o. female , who was originally seen here as a referral from [name] at the offices of  [name] for a transfer of care for multiple sclerosis.  Joan [name] reports that she received the diagnosis of multiple sclerosis at age 2. She was diagnosed by [name], after years of having symptoms that included skin dysesthesias , numbness and right eye vision loss or blurring of vision- Soon afterwards she was seen by [name] ,a multiple sclerosis specialist and followed him from the [facility] office to the [name] office. She is now looking for followup care , also within the network of her other  physicians.  Original diagnosis was established by clinical history, abnormal brain MRI and a spinal tap positive for oligoclonal bands. A copy of one of her last MRI is is available to me. Her brain MRI with and without contrast was compared to a Mclean from 2012 and documented only  minimal  Disease progression. The patient had mild ventricular enlargement unchanged for the last 3 years. Multiple areas of increased white matter signal in the periventricular space, significant involvement of the corpus callosum and classic Dawson's fingers. Involvement of white matter demyelination in the temporal area. [name] as  moderate to severe disease burden , referenced  to her long-standing multiple sclerosis./ The patient's most recent blood test results were also attached ( referral papers)- she has a normal white blood cell count she's not anemic, there is  normal kidney function ,normal  liver function and  she is not diabetic. She's currently controlled on Copaxone  with 40 MG 3 TIMES A WEEK THE PATIENT SWITCHED LAST SUMMER FROM daily to the 3 Times Weekly Formulation.  She has Relapsing Remitting Multiple Sclerosis , she has noticed neither  clinical changes nor side effects since switching in the formulation.    06-20-14  Joan. [name] has been stable neurologically and overall since our last visit. She has changed to the 3 times weekly form of Copaxone without any problems. She already received her pneumonia shot and her flu shot for this year. She resides in [place] and the community has been suffering under a viral infection wave this winter with the respiratory with respiratory symptoms as well as myalgia. She has so f/ar done very well. She has no recent blood test to review. She moved to a one level apartment , first floor and is happy with the new neighborhood. She lives alone with a cat.    01-03-2015. Joan. [name] is here for her regular routine and refill visit. She had no  evidence of any relapses she remains hypertensive height per recent flexible sigmoidoscopy but was normal Babinski responses and she has some proximal muscle rigidity but no cogwheeling associated with it all this speaks for a mild progressive form of MS for progression has been very slow and she has been responding well to the medications. This is her 31 year anniversary of the diagnosis of MS.   01-02-2016, Joan. [name] has not felt any impairment, no relapse or remitting symptoms of MS. We will repeat a brain MRI. Her original diagnosis was established by clinical history abnormal brain MRI and oligoclonal bands in her cerebral spinal fluid. We discussed today when she can have her MRI and she would like it in November or even December of this year at [facility] location. She would like an open MRI.   Interval history from 01/06/2017 for this established 67 year old Caucasian patient [name], who carries a diagnosis of multiple sclerosis. Heat and humidity affect her. The patient is currently treated with Copaxone 40 mg 3 times a week and has had no evidence of a relapse since. She has been stable for the last 4 years. Her diagnosis was established on 7-15- 2006. She drives, lives independent and alone and is able to perform all activities of daily living, handles her own finances.I like for her to have a new MRI - she would like to have it at [facility].    09-08-2017, R for this established patient with MS. Now due for MRI - brain and c spine. She insists on open MRI . She fell  while brooming off cobwebs from the ceiling and broke her left humerus. Went to ED 02-06-2017 , surgery 02-16-2017. She now has a metal plate.    REVIEW OF SYSTEMS: Out of a complete 14 system review of symptoms, the patient complains only of the following symptoms, diverticulitis, GERD, anxiety, and all other reviewed systems are negative.  ALLERGIES: Allergies  Allergen Reactions   Sulfa Antibiotics      Unsure    Codeine Nausea And Vomiting   Dilaudid [Hydromorphone Hcl] Nausea And Vomiting    Zofran helped    HOME MEDICATIONS: Outpatient Medications Prior to Visit  Medication Sig Dispense Refill   aspirin 81 MG chewable tablet Chew 1 tablet (81 mg total) by mouth 3 (three) times a week.     Cholecalciferol 50 MCG (2000 UT) CAPS Take 2,000 Units  by mouth daily.      Cyanocobalamin (VITAMIN B-12) 3000 MCG SUBL Place 3,000 mcg under the tongue daily.      diazepam (VALIUM) 5 MG tablet Take 2.5 mg by mouth at bedtime as needed (sleep).      Glatiramer Acetate (COPAXONE) 40 MG/ML SOSY INJECT  SUBCUTANEOUSLY  THREE TIMES WEEKLY AT LEAST 48 HOURS APART 12 mL 5   losartan (COZAAR) 50 MG tablet Take 50 mg by mouth daily.      Probiotic Product (PROBIOTIC PO) Take 1 capsule by mouth daily.     Psyllium (METAMUCIL FIBER PO) Take 2 Scoops by mouth at bedtime.      RIVAROXABAN (XARELTO) VTE STARTER PACK (15 & 20 MG) Follow package directions: Take one  tablet by mouth twice a day. On day 22, switch to one  tablet once a day. Take with food. 51 each 0   Vilazodone HCl (VIIBRYD) Mclean MG TABS TAKE 1 TABLET BY MOUTH DAILY OR AS DIRECTED BY DOCTOR 90 tablet 1   No facility-administered medications prior to visit.    PAST MEDICAL HISTORY: Past Medical History:  Diagnosis Date   Arthritis    Osteoarthritis   Colon polyps    GERD (gastroesophageal reflux disease)    Hiatal hernia    Hypertension    MS (multiple sclerosis) (HCC) 12/09/2013   Multiple sclerosis (HCC)    Seizures (HCC) 01/04/2005   1 and Only    PAST SURGICAL HISTORY: Past Surgical History:  Procedure Laterality Date   COLONOSCOPY     COLONOSCOPY N/A 09/16/2012   Procedure: COLONOSCOPY;  Surgeon: Malissa Hippo, Mclean;  [facility];  Service: Endoscopy;  Laterality: N/A;  1030-rescheduled to 9:30 Ann notified pt   COLONOSCOPY N/A 01/05/2020   Procedure: COLONOSCOPY;  Surgeon: Malissa Hippo, Mclean;  [facility];  Service: Endoscopy;  Laterality: N/A;  730   DILATION AND CURETTAGE OF UTERUS     ORIF HUMERUS FRACTURE Left 02/16/2017   Procedure: OPEN REDUCTION INTERNAL FIXATION (ORIF) LEFT PROXIMAL HUMERUS FRACTURE;  Surgeon: Cammy Copa, Mclean;  [facility];  Service: Orthopedics;  Laterality: Left;   POLYPECTOMY  01/05/2020   Procedure: POLYPECTOMY;  Surgeon: Malissa Hippo, Mclean;  [facility]: [facility];  Service: Endoscopy;;    FAMILY HISTORY: Family History  Problem Relation Age of Onset   Stomach cancer Father    Sick sinus syndrome Father    Colon cancer Neg Hx     SOCIAL HISTORY: Social History   Socioeconomic History   Marital status: Single    Spouse name: Not on file   Number of children: 0   Years of education: College   Highest education level: Not on file  Occupational History    Employer: DISABLED  Tobacco Use   Smoking status: Every Day    Packs/day: 0.75    Years: 30.00    Additional pack years: 0.00    Total pack years: 22.50    Types: Cigarettes   Smokeless tobacco: Never  Vaping Use   Vaping Use: Never used  Substance and Sexual Activity   Alcohol use: Yes    Alcohol/week: 8.0 standard drinks of alcohol    Types: 8 Cans of beer per week    Comment: 4-5 beers daily    Drug use: No   Sexual activity: Not on file  Other Topics Concern   Not on file  Social History Narrative   Patient is single and lives alone.  Patient has a college education   Patient is right-handed.   Patient is retired.   Patient drinks three cups of coffee daily.   Social Determinants of Health   Financial Resource Strain: Low Risk  (02/25/2022)   Overall Financial Resource Strain (CARDIA)    Difficulty of Paying Living Expenses: Not hard at all  Food Insecurity: Not on file  Transportation Needs: No Transportation Needs (02/25/2022)   PRAPARE - Administrator, Civil Service (Medical): No    Lack of Transportation (Non-Medical): No  Physical  Activity: Not on file  Stress: Not on file  Social Connections: Not on file  Intimate Partner Violence: Not on file      PHYSICAL EXAM  There were no vitals filed for this visit.   There is no height or weight on file to calculate BMI.  Generalized: Well developed, in no acute distress  Cardiology: normal rate and rhythm, no murmur noted Respiratory: clear to auscultation bilaterally  Neurological examination  Mentation: Alert oriented to time, place, history taking. Follows all commands speech and language fluent Cranial nerve II-XII: Pupils were equal round reactive to light. Extraocular movements were full, visual field were full on confrontational test. Facial sensation and strength were normal. Uvula tongue midline. Head turning and shoulder shrug  were normal and symmetric. Motor: The motor testing reveals 5 over 5 strength of bilateral upper extremities, 4+/5 left hip flexion, 5/5 right lower. Good symmetric motor tone is noted throughout.  Sensory: Sensory testing is intact to soft touch on all 4 extremities. No evidence of extinction is noted.  Coordination: Cerebellar testing reveals good finger-nose-finger and heel-to-shin bilaterally.  Gait and station: Gait is normal. Tandem gait is stable.  Reflexes: Deep tendon reflexes are symmetric and normal bilaterally of upper extremities, brisk but symmetric in bilateral lower pattellar and suprapatellar.   DIAGNOSTIC DATA (LABS, IMAGING, TESTING) - I reviewed patient records, labs, notes, testing and imaging myself where available.      No data to display           Lab Results  Component Value Date   WBC 8.3 10/10/2021   HGB 15.6 10/10/2021   HCT 43.4 10/10/2021   MCV 100 (H) 10/10/2021   PLT 286 10/10/2021      Component Value Date/Time   NA 137 10/10/2021 1141   K 4.7 10/10/2021 1141   CL 99 10/10/2021 1141   CO2 24 10/10/2021 1141   GLUCOSE 99 10/10/2021 1141   GLUCOSE 101 (H) 11/14/2019 1214   BUN 9  10/10/2021 1141   CREATININE 0.69 10/10/2021 1141   CALCIUM Mclean.1 10/10/2021 1141   PROT 6.9 10/10/2021 1141   ALBUMIN 4.8 10/10/2021 1141   AST 29 10/10/2021 1141   ALT 23 10/10/2021 1141   ALKPHOS 83 10/10/2021 1141   BILITOT 0.5 10/10/2021 1141   GFRNONAA >60 11/14/2019 1214   GFRAA >60 11/14/2019 1214   Lab Results  Component Value Date   CHOL 187 10/10/2021   HDL 105 10/10/2021   LDLCALC 71 10/10/2021   TRIG 54 10/10/2021   CHOLHDL 1.8 10/10/2021   No results found for: "HGBA1C" No results found for: "VITAMINB12" No results found for: "TSH"     ASSESSMENT AND PLAN 67 y.o. year old female  has a past medical history of Arthritis, Colon polyps, GERD (gastroesophageal reflux disease), Hiatal hernia, Hypertension, MS (multiple sclerosis) (HCC) (12/09/2013), Multiple sclerosis (HCC), and Seizures (HCC) (01/04/2005). here with   No diagnosis found.  [name] is doing well today.  She denies any new or worsening symptoms.  We will continue Copaxone injections as prescribed.  She will continue Viibryd daily as this is helping with anxiety and depression. She may continue Nexium PRN but advised against regular usage. Labs and MRI stable. She will call me with any new or worsening symptoms. She will return to see Joan [name] in 6-8 months. May consider discontinuing Capaxone pending [name]'s visit. She verbalizes understanding and agreement with this plan.   No orders of the defined types were placed in this encounter.     No orders of the defined types were placed in this encounter.      I spent 15 minutes with the patient. 50% of this time was spent counseling and educating patient on plan of care and medications.    Shawnie Dapper, FNP-C 10/07/2022, 1:54 PM [facility] [address] [place], [place] [address] [phone]

## 2022-10-07 NOTE — Patient Instructions (Signed)
Below is our plan:  We will continue Viibryd 10mg  daily.   Please make sure you are staying well hydrated. I recommend 50-60 ounces daily. Well balanced diet and regular exercise encouraged. Consistent sleep schedule with 6-8 hours recommended.   Please continue follow up with care team as directed.   Follow up with me in 1 year   You may receive a survey regarding today's visit. I encourage you to leave honest feed back as I do use this information to improve patient care. Thank you for seeing me today!   Management of Memory Problems   There are some general things you can do to help manage your memory problems.  Your memory may not in fact recover, but by using techniques and strategies you will be able to manage your memory difficulties better.   1)  Establish a routine. Try to establish and then stick to a regular routine.  By doing this, you will get used to what to expect and you will reduce the need to rely on your memory.  Also, try to do things at the same time of day, such as taking your medication or checking your calendar first thing in the morning. Think about think that you can do as a part of a regular routine and make a list.  Then enter them into a daily planner to remind you.  This will help you establish a routine.   2)  Organize your environment. Organize your environment so that it is uncluttered.  Decrease visual stimulation.  Place everyday items such as keys or cell phone in the same place every day (ie.  Basket next to front door) Use post it notes with a brief message to yourself (ie. Turn off light, lock the door) Use labels to indicate where things go (ie. Which cupboards are for food, dishes, etc.) Keep a notepad and pen by the telephone to take messages   3)  Memory Aids A diary or journal/notebook/daily planner Making a list (shopping list, chore list, to do list that needs to be done) Using an alarm as a reminder (kitchen timer or cell phone alarm) Using  cell phone to store information (Notes, Calendar, Reminders) Calendar/White board placed in a prominent position Post-it notes   In order for memory aids to be useful, you need to have good habits.  It's no good remembering to make a note in your journal if you don't remember to look in it.  Try setting aside a certain time of day to look in journal.   4)  Improving mood and managing fatigue. There may be other factors that contribute to memory difficulties.  Factors, such as anxiety, depression and tiredness can affect memory. Regular gentle exercise can help improve your mood and give you more energy. Exercise: there are short videos created by the [employer] on Health specially for older adults: https://bit.ly/2I30q97.  Mediterranean diet: which emphasizes fruits, vegetables, whole grains, legumes, fish, and other seafood; unsaturated fats such as olive oils; and low amounts of red meat, eggs, and sweets. A variation of this, called MIND (Mediterranean-DASH Intervention for Neurodegenerative Delay) incorporates the DASH (Dietary Approaches to Stop Hypertension) diet, which has been shown to lower high blood pressure, a risk factor for Alzheimer's disease. More information at: ExitMarketing.de.  Aerobic exercise that improve heart health is also good for the mind.  [employer] on Aging have short videos for exercises that you can do at home: BlindWorkshop.com.pt Simple relaxation techniques may help relieve symptoms of  anxiety Try to get back to completing activities or hobbies you enjoyed doing in the past. Learn to pace yourself through activities to decrease fatigue. Find out about some local support groups where you can share experiences with others. Try and achieve 7-8 hours of sleep at night.

## 2022-10-08 ENCOUNTER — Ambulatory Visit: Payer: Medicare PPO | Admitting: Family Medicine

## 2022-10-08 ENCOUNTER — Encounter: Payer: Self-pay | Admitting: Family Medicine

## 2022-10-08 VITALS — BP 107/74 | HR 80 | Ht 65.0 in | Wt 117.6 lb

## 2022-10-08 DIAGNOSIS — G35 Multiple sclerosis: Secondary | ICD-10-CM | POA: Diagnosis not present

## 2022-10-08 DIAGNOSIS — F32A Depression, unspecified: Secondary | ICD-10-CM | POA: Diagnosis not present

## 2022-10-08 DIAGNOSIS — I82409 Acute embolism and thrombosis of unspecified deep veins of unspecified lower extremity: Secondary | ICD-10-CM | POA: Diagnosis not present

## 2022-10-08 MED ORDER — VILAZODONE HCL 10 MG PO TABS: 10.0000 mg | ORAL_TABLET | Freq: Every day | ORAL | 3 refills | Status: DC

## 2022-10-16 ENCOUNTER — Ambulatory Visit (INDEPENDENT_AMBULATORY_CARE_PROVIDER_SITE_OTHER): Payer: Medicare PPO | Admitting: Gastroenterology

## 2022-10-16 ENCOUNTER — Encounter (INDEPENDENT_AMBULATORY_CARE_PROVIDER_SITE_OTHER): Payer: Self-pay | Admitting: Gastroenterology

## 2022-10-16 VITALS — BP 117/80 | HR 78 | Temp 98.0°F | Ht 65.0 in | Wt 119.6 lb

## 2022-10-16 DIAGNOSIS — K869 Disease of pancreas, unspecified: Secondary | ICD-10-CM

## 2022-10-16 NOTE — Patient Instructions (Addendum)
Bring MRI CD images Referral to Dr. Meridee Score for EUS Smoking and alcohol cessation Will request labs from Dr. Phillips Odor

## 2022-10-16 NOTE — Progress Notes (Signed)
[name], M.D. Gastroenterology & Hepatology [facility] [address] [place], [place] [address]  Primary Care Physician: Assunta Found, MD [address] [place] [address]  I will communicate my assessment and recommendations to the referring MD via EMR.  Problems: GERD Pancreatic lesions  History of Present Illness: [name] is a 67 y.o. female with past medical history of osteoarthritis, GERD, hiatal hernia, hypertension, multiple sclerosis and seizures, who presents for follow up of pancreatic lesions.  The patient was last seen on 11/22/2019. At that time, the patient was being evaluated after episode of diverticulitis which improved with antibiotic treatment.  She was continued on as needed Nexium for GERD.  Patient denies any complaints. The patient denies having any nausea, vomiting, fever, chills, hematochezia, melena, hematemesis, abdominal distention, abdominal pain, diarrhea, jaundice, pruritus or weight loss.  She is an active smoker - smokes 1 pack a day for 25 years. Due to this, the patient underwent a CT of the chest without contrast on 08/07/2022.  Images reported cystic lesions in the pancreatic tail which "were grossly stable compared to prior CT of the abdomen on 11/14/2019".  Due to this, the patient was ordered to have an MRI of the abdomen with and without IV contrast on 08/26/2022-I do not have access to these images at the moment.  The MRI showed a 2 x 1.5 cm hyperintense lesion with multicystic component/septations and no enhancement in the pancreatic tail.  There was a second cystic lesion in the uncinate process measuring 1.3 x 1.1 cm without septations or enhancement.  No pancreatic ductal dilation was found.  Notably, the patient had a CT of the abdomen and pelvis with contrast on 11/14/2019 that showed a 1.0 cm fluid lesion in the pancreatic uncinate which was concerning for IPMN but no follow-up imaging  was performed.  There was no presence of any other pancreatic cystic lesions in the pancreatic tail at that time mentioned in the report.  However upon my review of the images, there was a septated lesion measuring 1.7 mm in the pancreatic tail.  I personally reviewed the most recent CT chest she had performed and this image showed a lesion different from the image I observed in her CT of the abdomen and pelvis from 2021.  Unfortunately, I do not have the CD to review the images of her most recent MRI of the abdomen with and without IV contrast.  She drinks 3-4 beers per day.  Father had gastric cancer.  No recent labs are available from 2024 - we did not receive any of the labs performed. However, she brings her last labs performed on  10/11/2022 - CMP Bun 9 Cr 0.69, glucose 99, Na 137  Cl 99 CO2 24 Ca 10.1  albumin 4.8 TB 0.5 alk phos 83 AST 29 ALT 23, CBC  WBC 8.3 Hb 15.6 PLT 286  Last EGD: never Last Colonoscopy: 01/05/2020 8 mm polyp in the mid transverse colon (tubular adenoma), diverticulosis, small polyp in the distal rectum (resected but not retrieved). Recommended repeat in 5 years.  Past Medical History: Past Medical History:  Diagnosis Date   Arthritis    Osteoarthritis   Colon polyps    GERD (gastroesophageal reflux disease)    Hiatal hernia    Hypertension    MS (multiple sclerosis) 12/09/2013   Multiple sclerosis    Seizures 01/04/2005   1 and Only    Past Surgical History: Past Surgical History:  Procedure Laterality  Date   COLONOSCOPY     COLONOSCOPY N/A 09/16/2012   Procedure: COLONOSCOPY;  Surgeon: Malissa Hippo, MD;  [facility];  Service: Endoscopy;  Laterality: N/A;  1030-rescheduled to 9:30 Ann notified pt   COLONOSCOPY N/A 01/05/2020   Procedure: COLONOSCOPY;  Surgeon: Malissa Hippo, MD;  [facility];  Service: Endoscopy;  Laterality: N/A;  730   DILATION AND CURETTAGE OF UTERUS     ORIF HUMERUS FRACTURE Left 02/16/2017   Procedure:  OPEN REDUCTION INTERNAL FIXATION (ORIF) LEFT PROXIMAL HUMERUS FRACTURE;  Surgeon: Cammy Copa, MD;  Location: [facility] OR;  Service: Orthopedics;  Laterality: Left;   POLYPECTOMY  01/05/2020   Procedure: POLYPECTOMY;  Surgeon: Malissa Hippo, MD;  [facility];  Service: Endoscopy;;    Family History: Family History  Problem Relation Age of Onset   Stomach cancer Father    Sick sinus syndrome Father    Colon cancer Neg Hx     Social History: Social History   Tobacco Use  Smoking Status Every Day   Packs/day: 0.75   Years: 30.00   Additional pack years: 0.00   Total pack years: 22.50   Types: Cigarettes  Smokeless Tobacco Never   Social History   Substance and Sexual Activity  Alcohol Use Yes   Alcohol/week: 8.0 standard drinks of alcohol   Types: 8 Cans of beer per week   Comment: 4-5 beers daily    Social History   Substance and Sexual Activity  Drug Use No    Allergies: Allergies  Allergen Reactions   Sulfa Antibiotics     Unsure    Codeine Nausea And Vomiting   Dilaudid [Hydromorphone Hcl] Nausea And Vomiting    Zofran helped    Medications: Current Outpatient Medications  Medication Sig Dispense Refill   Cholecalciferol 50 MCG (2000 UT) CAPS Take 2,000 Units by mouth daily.      Cyanocobalamin (VITAMIN B-12) 3000 MCG SUBL Place 3,000 mcg under the tongue daily.      diazepam (VALIUM) 5 MG tablet Take 5 mg by mouth at bedtime as needed (sleep).     losartan (COZAAR) 50 MG tablet Take 50 mg by mouth daily.      Probiotic Product (PROBIOTIC PO) Take 1 capsule by mouth daily.     Psyllium (METAMUCIL FIBER PO) Take 2 Scoops by mouth at bedtime.      RIVAROXABAN (XARELTO) VTE STARTER PACK (15 & 20 MG) Follow package directions: Take one  tablet by mouth twice a day. On day 22, switch to one  tablet once a day. Take with food. (Patient taking differently: Take 20 mg by mouth daily at 6 (six) AM.) 51 each 0   Vilazodone HCl (VIIBRYD) 10 MG  TABS Take 1 tablet (10 mg total) by mouth daily. TAKE 1 TABLET BY MOUTH DAILY OR AS DIRECTED BY DOCTOR 90 tablet 3   vitamin C (ASCORBIC ACID) 250 MG tablet Take 250 mg by mouth daily.     No current facility-administered medications for this visit.    Review of Systems: GENERAL: negative for malaise, night sweats HEENT: No changes in hearing or vision, no nose bleeds or other nasal problems. NECK: Negative for lumps, goiter, pain and significant neck swelling RESPIRATORY: Negative for cough, wheezing CARDIOVASCULAR: Negative for chest pain, leg swelling, palpitations, orthopnea GI: SEE HPI MUSCULOSKELETAL: Negative for joint pain or swelling, back pain, and muscle pain. SKIN: Negative for lesions, rash PSYCH: Negative for sleep disturbance, mood  disorder and recent psychosocial stressors. HEMATOLOGY Negative for prolonged bleeding, bruising easily, and swollen nodes. ENDOCRINE: Negative for cold or heat intolerance, polyuria, polydipsia and goiter. NEURO: negative for tremor, gait imbalance, syncope and seizures. The remainder of the review of systems is noncontributory.   Physical Exam: BP 117/80 (BP Location: Left Arm, Patient Position: Sitting, Cuff Size: Small)   Pulse 78   Temp 98 F (36.7 C) (Temporal)   Ht 5\' 5"  (1.651 m)   Wt 119 lb 9.6 oz (54.3 kg)   BMI 19.90 kg/m  GENERAL: The patient is AO x3, in no acute distress. HEENT: Head is normocephalic and atraumatic. EOMI are intact. Mouth is well hydrated and without lesions. NECK: Supple. No masses LUNGS: Clear to auscultation. No presence of rhonchi/wheezing/rales. Adequate chest expansion HEART: RRR, normal s1 and s2. ABDOMEN: Soft, nontender, no guarding, no peritoneal signs, and nondistended. BS +. No masses. EXTREMITIES: Without any cyanosis, clubbing, rash, lesions or edema. NEUROLOGIC: AOx3, no focal motor deficit. SKIN: no jaundice, no rashes  Imaging/Labs: as above  I personally reviewed and interpreted the  available labs, imaging and endoscopic files.  Impression and Plan: [name] is a 67 y.o. female with past medical history of osteoarthritis, GERD, hiatal hernia, hypertension, multiple sclerosis and seizures, who presents for follow up of pancreatic lesions.  Patient was found to have an incidental new pancreatic lesion in the tail of her pancreas on most recent CT chest.  She has been completely asymptomatic and has not presented any evidence of insulin resistance.  However, she has some risk factors for pancreatic malignancy such as active heavy smoking and alcohol use.  I personally reviewed the images from her most recent CT of the chest and the pancreatic lesion seems to be new compared to prior CT of the abdomen.  She underwent last MRI of the abdomen that showed presence of a pancreatic lesion with internal septations but unfortunately I do not have this imaging available.  I asked the patient to bring the CD with the images so I can review this.  Also, CA 19 9 and lipase levels are not available, I will request most recent blood workup from PCP but these have not been ordered I will order these tests.  We discussed the possibility of evaluating further her septated cyst with an endoscopic ultrasound.  I will refer her case to Dr. Meridee Score at [place], which she is agreeable to proceed with.  I emphasized the importance of smoking and alcohol cessation which she understood.  - Bring MRI CD images - Referral to Dr. Meridee Score for EUS - Smoking and alcohol cessation - Will request labs from Dr. Phillips Odor - May order CA 19-9 and lipase level if these were not performed by PCP.  All questions were answered.      Joan Blazing, MD Gastroenterology and Hepatology [facility]

## 2022-10-17 ENCOUNTER — Telehealth (INDEPENDENT_AMBULATORY_CARE_PROVIDER_SITE_OTHER): Payer: Self-pay | Admitting: Gastroenterology

## 2022-10-17 NOTE — Telephone Encounter (Signed)
I reviewed the MRCP with and without contrast images today with Dr. Tyron Russell.  He confirmed the presence of a 2 cm cyst in the pancreatic tail with a thin septation, which was already present in the CT of the abdomen and pelvis with IV contrast that she had performed in 2021.  He confirmed that the size has not changed at all, recommended repeat MRCP in 1 year.  No presence of pancreatic ductal dilation or pancreatic atrophy.  We will hold off on endoscopic ultrasound at this moment.  Hi Ann, please put a reminder for repeat MRCP in 1 year.  Thanks,   Joan Blazing, MD Gastroenterology and Hepatology [facility]

## 2022-10-20 NOTE — Telephone Encounter (Signed)
MRCP noted in recall for 1 year

## 2022-10-28 ENCOUNTER — Ambulatory Visit (HOSPITAL_COMMUNITY)
Admission: RE | Admit: 2022-10-28 | Discharge: 2022-10-28 | Disposition: A | Discharge status: Home / Self Care | Payer: Medicare PPO | Source: Ambulatory Visit | Attending: Gastroenterology | Admitting: Gastroenterology

## 2022-10-28 ENCOUNTER — Other Ambulatory Visit (HOSPITAL_COMMUNITY): Payer: Self-pay | Admitting: Family Medicine

## 2022-10-28 ENCOUNTER — Ambulatory Visit (INDEPENDENT_AMBULATORY_CARE_PROVIDER_SITE_OTHER): Payer: Medicare PPO | Admitting: Gastroenterology

## 2022-10-28 ENCOUNTER — Encounter (INDEPENDENT_AMBULATORY_CARE_PROVIDER_SITE_OTHER): Payer: Self-pay | Admitting: Gastroenterology

## 2022-10-28 VITALS — BP 111/76 | HR 71 | Temp 97.9°F | Ht 65.0 in | Wt 118.3 lb

## 2022-10-28 DIAGNOSIS — R1032 Left lower quadrant pain: Secondary | ICD-10-CM | POA: Diagnosis not present

## 2022-10-28 DIAGNOSIS — K5732 Diverticulitis of large intestine without perforation or abscess without bleeding: Secondary | ICD-10-CM | POA: Diagnosis not present

## 2022-10-28 DIAGNOSIS — K5730 Diverticulosis of large intestine without perforation or abscess without bleeding: Secondary | ICD-10-CM | POA: Diagnosis not present

## 2022-10-28 DIAGNOSIS — Z1231 Encounter for screening mammogram for malignant neoplasm of breast: Secondary | ICD-10-CM

## 2022-10-28 MED ORDER — IOHEXOL 300 MG/ML  SOLN
100.0000 mL | Freq: Once | INTRAMUSCULAR | Status: AC | PRN
  Administered 2022-10-28: 100 mL via INTRAVENOUS

## 2022-10-28 NOTE — Patient Instructions (Signed)
Please continue your flagyl and cipro, we will get a CT of your abdomen to evaluate for diverticulitis. I will be in touch once I have these results.

## 2022-10-28 NOTE — Progress Notes (Signed)
Referring Provider: Assunta Found, MD Primary Care Physician:  Assunta Found, MD Primary GI Physician: Levon Hedger   Chief Complaint  Patient presents with   Abdominal Pain    Patient thinks she is having a flare up of diverticulitis. Taking flagyl and cipro that pcp gave her to have on hand in case she needs it. Has been on it for 9 days. Having pain on left side. Having dark stool and diarrhea.    HPI:   [name] is a 67 y.o. female with past medical history of osteoarthritis, GERD, hiatal hernia, hypertension, multiple sclerosis and seizures.  Patient presenting today for LLQ pain, suspected diverticulitis.  Patient reports history of diverticulitis (last documented case in 2017 via CT), she started having LLQ pain that began on 4/27. She also notes that stools have been looser and dark, possibly black. No BRBPR. She had Cipro and flagyl on hand from her PCP who had provided her with a refill after her last suspected episode of diverticulitis,  which she started on 9 days ago. She feels that symptoms are improving some. She is trying to eat a bland, soft diet. Does note eating a lot of strawberries prior to LLQ pain beginning. No fevers or chills. No nausea or vomiting.  She was treated last empirically in April 2023 for suspected diverticulitis though no imaging done at that time.    Last EGD: never Last Colonoscopy: 01/05/2020 8 mm polyp in the mid transverse colon (tubular adenoma), diverticulosis, small polyp in the distal rectum (resected but not retrieved). Recommended repeat in 5 years.   Past Medical History:  Diagnosis Date   Arthritis    Osteoarthritis   Colon polyps    GERD (gastroesophageal reflux disease)    Hiatal hernia    Hypertension    MS (multiple sclerosis) (HCC) 12/09/2013   Multiple sclerosis (HCC)    Seizures (HCC) 01/04/2005   1 and Only    Past Surgical History:  Procedure Laterality Date   COLONOSCOPY     COLONOSCOPY N/A 09/16/2012   Procedure:  COLONOSCOPY;  Surgeon: Malissa Hippo, MD;  [facility];  Service: Endoscopy;  Laterality: N/A;  1030-rescheduled to 9:30 Ann notified pt   COLONOSCOPY N/A 01/05/2020   Procedure: COLONOSCOPY;  Surgeon: Malissa Hippo, MD;  [facility];  Service: Endoscopy;  Laterality: N/A;  730   DILATION AND CURETTAGE OF UTERUS     ORIF HUMERUS FRACTURE Left 02/16/2017   Procedure: OPEN REDUCTION INTERNAL FIXATION (ORIF) LEFT PROXIMAL HUMERUS FRACTURE;  Surgeon: Cammy Copa, MD;  Location: [facility] OR;  Service: Orthopedics;  Laterality: Left;   POLYPECTOMY  01/05/2020   Procedure: POLYPECTOMY;  Surgeon: Malissa Hippo, MD;  [facility];  Service: Endoscopy;;    Current Outpatient Medications  Medication Sig Dispense Refill   Cholecalciferol 50 MCG (2000 UT) CAPS Take 2,000 Units by mouth daily.      CIPRO 500 MG tablet      Cyanocobalamin (VITAMIN B-12) 3000 MCG SUBL Place 3,000 mcg under the tongue daily.      diazepam (VALIUM) 5 MG tablet Take 5 mg by mouth at bedtime as needed (sleep).     losartan (COZAAR) 50 MG tablet Take 50 mg by mouth daily.      metroNIDAZOLE (FLAGYL) 500 MG tablet Take 500 mg by mouth 2 (two) times daily.     Probiotic Product (PROBIOTIC PO) Take 1 capsule by mouth daily.     Psyllium (METAMUCIL  FIBER PO) Take 2 Scoops by mouth at bedtime.      RIVAROXABAN (XARELTO) VTE STARTER PACK (15 & 20 MG) Follow package directions: Take one 15mg  tablet by mouth twice a day. On day 22, switch to one 20mg  tablet once a day. Take with food. (Patient taking differently: Take 20 mg by mouth daily at 6 (six) AM.) 51 each 0   Vilazodone HCl (VIIBRYD) 10 MG TABS Take 1 tablet (10 mg total) by mouth daily. TAKE 1 TABLET BY MOUTH DAILY OR AS DIRECTED BY DOCTOR 90 tablet 3   vitamin C (ASCORBIC ACID) 250 MG tablet Take 250 mg by mouth daily.     No current facility-administered medications for this visit.    Allergies as of 10/28/2022 - Review Complete  10/28/2022  Allergen Reaction Noted   Sulfa antibiotics  11/12/2019   Codeine Nausea And Vomiting 07/13/2012   Dilaudid [hydromorphone hcl] Nausea And Vomiting 02/13/2017    Family History  Problem Relation Age of Onset   Stomach cancer Father    Sick sinus syndrome Father    Colon cancer Neg Hx     Social History   Socioeconomic History   Marital status: Single    Spouse name: Not on file   Number of children: 0   Years of education: College   Highest education level: Not on file  Occupational History    Employer: DISABLED  Tobacco Use   Smoking status: Every Day    Packs/day: 0.75    Years: 30.00    Additional pack years: 0.00    Total pack years: 22.50    Types: Cigarettes    Passive exposure: Current   Smokeless tobacco: Never  Vaping Use   Vaping Use: Never used  Substance and Sexual Activity   Alcohol use: Yes    Alcohol/week: 8.0 standard drinks of alcohol    Types: 8 Cans of beer per week    Comment: 4-5 beers daily    Drug use: No   Sexual activity: Not on file  Other Topics Concern   Not on file  Social History Narrative   Patient is single and lives alone.   Patient has a college education   Patient is right-handed.   Patient is retired.   Patient drinks three cups of coffee daily.   Social Determinants of Health   Financial Resource Strain: Low Risk  (02/25/2022)   Overall Financial Resource Strain (CARDIA)    Difficulty of Paying Living Expenses: Not hard at all  Food Insecurity: Not on file  Transportation Needs: No Transportation Needs (02/25/2022)   PRAPARE - Administrator, Civil Service (Medical): No    Lack of Transportation (Non-Medical): No  Physical Activity: Not on file  Stress: Not on file  Social Connections: Not on file   Review of systems General: negative for malaise, night sweats, fever, chills, weight loss Neck: Negative for lumps, goiter, pain and significant neck swelling Resp: Negative for cough, wheezing,  dyspnea at rest CV: Negative for chest pain, leg swelling, palpitations, orthopnea GI: denies hematochezia, nausea, vomiting,  constipation, dysphagia, odyonophagia, early satiety or unintentional weight loss. +LLQ Pain +dark stools + loose stools  MSK: Negative for joint pain or swelling, back pain, and muscle pain. Derm: Negative for itching or rash Psych: Denies depression, anxiety, memory loss, confusion. No homicidal or suicidal ideation.  Heme: Negative for prolonged bleeding, bruising easily, and swollen nodes. Endocrine: Negative for cold or heat intolerance, polyuria, polydipsia and goiter. Neuro: negative  for tremor, gait imbalance, syncope and seizures. The remainder of the review of systems is noncontributory.  Physical Exam: BP 111/76 (BP Location: Left Arm, Patient Position: Sitting, Cuff Size: Normal)   Pulse 71   Temp 97.9 F (36.6 C) (Oral)   Ht 5\' 5"  (1.651 m)   Wt 118 lb 4.8 oz (53.7 kg)   BMI 19.69 kg/m  General:   Alert and oriented. No distress noted. Pleasant and cooperative.  Head:  Normocephalic and atraumatic. Eyes:  Conjuctiva clear without scleral icterus. Mouth:  Oral mucosa pink and moist. Good dentition. No lesions. Heart: Normal rate and rhythm, s1 and s2 heart sounds present.  Lungs: Clear lung sounds in all lobes. Respirations equal and unlabored. Abdomen:  +BS, soft, and non-distended. +mild TTP of LLQ.  No rebound or guarding. No HSM or masses noted. Derm: No palmar erythema or jaundice Msk:  Symmetrical without gross deformities. Normal posture. Extremities:  Without edema. Neurologic:  Alert and  oriented x4 Psych:  Alert and cooperative. Normal mood and affect.  Invalid input(s): "6 MONTHS"   ASSESSMENT: [name] is a 67 y.o. female presenting today for LLQ pain, suspected diverticulitis.  LLQ pain, dark/loose stools that began on 4/27. No fevers or chills. Patient reports similar symptoms with previous episodes of diverticulitis. She  started cipro and flagyl 9 days ago that she had on hand from her PCP after previous empiric treatment of diverticulitis. Notably, no documented imaging to confirm diverticulitis since 2017 but she reports last episode she was treated for was April 2023. She has very  mild TTP of LLQ. Symptoms are improving but not resolved. Recommend proceeding with CT A/P to confirm this is diverticulitis. Given suspected recurrent episodes of diverticulitis, may need to consider updating colonoscopy for further evaluation. For now will continue with current antibiotic regimen, will complete 14 days total if CT confirms diverticulitis.    PLAN:  CT A/P w contrast  2. Continue cipro, flagyl, will complete 14 days if CT confirms  3. May need to consider repeat TCS given ongoing diverticulitis flares  4. Bland diet for now, high fiber after resolution of symptoms  All questions were answered, patient verbalized understanding and is in agreement with plan as outlined above.   Joplin Canty L. Jeanmarie Hubert, MSN, APRN, AGNP-C Adult-Gerontology Nurse Practitioner [facility]  I have reviewed the note and agree with the APP's assessment as described in this progress note  Katrinka Blazing, MD Gastroenterology and Hepatology [facility] Gastroenterology

## 2022-10-30 ENCOUNTER — Telehealth (INDEPENDENT_AMBULATORY_CARE_PROVIDER_SITE_OTHER): Payer: Self-pay | Admitting: Gastroenterology

## 2022-10-30 MED ORDER — PLENVU 140 G PO SOLR: ORAL | 0 refills | Status: DC

## 2022-10-30 NOTE — Telephone Encounter (Signed)
Approved Authorization #[id number]  Tracking #[id number]  Dates of service 10/30/2022 - 01/30/2023

## 2022-10-30 NOTE — Telephone Encounter (Signed)
Per Sandi Carne, can we schedule Ms. Glendell Docker for Colonoscopy, diagnosis recurrent diverticulitis. Will need to be done about 6-8 weeks out from now. ASA III   Pt contacted and colonoscopy scheduled for 12/23/22. Prep sent to pharmacy. Pt is currently on Xarelto-medication clearance sent to PCP. Will send instruction via my chart once clearance received. Will also call pt with pre op appt or put in instruction letter if pre op has been scheduled when I send them out.

## 2022-10-30 NOTE — Telephone Encounter (Signed)
    10/30/22  [name] March 12, 1956  What type of surgery is being performed? Colonoscopy   When is surgery scheduled? 12/23/22  Clearance to hold Xarelto  Name of physician performing surgery?  Dr. Katrinka Blazing [facility] at [facility] Phone: [phone] Fax: [phone]  Anethesia type (none, local, MAC, general)? MAC

## 2022-11-04 ENCOUNTER — Other Ambulatory Visit (INDEPENDENT_AMBULATORY_CARE_PROVIDER_SITE_OTHER): Payer: Self-pay | Admitting: Gastroenterology

## 2022-11-04 ENCOUNTER — Telehealth (INDEPENDENT_AMBULATORY_CARE_PROVIDER_SITE_OTHER): Payer: Self-pay | Admitting: Gastroenterology

## 2022-11-04 MED ORDER — PEG 3350-KCL-NA BICARB-NACL 420 G PO SOLR: 4000.0000 mL | Freq: Once | ORAL | 0 refills | Status: AC

## 2022-11-04 NOTE — Telephone Encounter (Signed)
   Patient Name: [name]  DOB: 27-Aug-1955 MRN: [id number]  Primary Cardiologist: None  Chart reviewed as part of pre-operative protocol coverage. Pre-op clearance already addressed by colleagues in earlier phone notes. To summarize recommendations:  -Xarelto has been prescribed by ED provider for DVT and followed by PCP. Please reach out to PCP.   Medical clearance was not requested.   Will route this bundled recommendation to requesting provider via Epic fax function and remove from pre-op pool. Please call with questions.  Sharlene Dory, PA-C 11/04/2022, 4:55 PM

## 2022-11-04 NOTE — Telephone Encounter (Signed)
Medication clearance sent to pool.  

## 2022-11-04 NOTE — Telephone Encounter (Signed)
Pt left voicemail stating that insurance is refusing to pay for Plenvu.  Returned call to patient. Pt states that there was going to be a $60 copay. Pt states that we could send it to [facility] to see if the copay would be cheaper. Pt also asked about Trilyte. Informed pt that [employer] [facility] should be covered almost if not 100%. Trilyte sent to [employer].    Instructions will be sent once med clearance is received.

## 2022-11-04 NOTE — Telephone Encounter (Signed)
Xarelto has been prescribed by ED provider for DVT and followed by PCP. Please reach out to PCP.

## 2022-11-04 NOTE — Telephone Encounter (Signed)
Will wait for further instructions from pre op APP.

## 2022-11-05 DIAGNOSIS — H04123 Dry eye syndrome of bilateral lacrimal glands: Secondary | ICD-10-CM | POA: Diagnosis not present

## 2022-11-05 NOTE — Telephone Encounter (Signed)
Medication clearance sent to PCP 

## 2022-11-05 NOTE — Telephone Encounter (Signed)
Not sure what this procedure is, but there was a separate request sent in 5/9 phone note for colonoscopy - "Xarelto has been prescribed by ED provider for DVT and followed by PCP. Please reach out to PCP."

## 2022-11-10 ENCOUNTER — Ambulatory Visit (HOSPITAL_COMMUNITY): Payer: Medicare PPO

## 2022-11-12 NOTE — Telephone Encounter (Signed)
Contacted PCP to check on med clearance. Front dest asked that I fax it again. Faxed to [facility] at [phone]

## 2022-11-13 NOTE — Telephone Encounter (Signed)
12/19/22 9:30 Pre op

## 2022-11-14 NOTE — Telephone Encounter (Signed)
Contacted [facility] due to not receiving clearance. Front desk states they do not see the paper. Was asked to fax clearance again. Clearance faxed once more.

## 2022-11-18 NOTE — Telephone Encounter (Signed)
Pt left message and pt states that Dr.Golding does not want her to hold Xarelto because Dr.Golding is afraid she will have a stroke. Please advise. Thank you.

## 2022-11-18 NOTE — Telephone Encounter (Signed)
Clearance received from [facility]; fax returned with "WOULD RATHER NOT". Pt contacted and made aware. Pt states she will get in contact with PCP for clarification.

## 2022-11-18 NOTE — Telephone Encounter (Signed)
FYI - please advise.  Thank you!

## 2022-11-20 ENCOUNTER — Ambulatory Visit (HOSPITAL_COMMUNITY)
Admission: RE | Admit: 2022-11-20 | Discharge: 2022-11-20 | Disposition: A | Discharge status: Home / Self Care | Payer: Medicare PPO | Source: Ambulatory Visit | Attending: Family Medicine | Admitting: Family Medicine

## 2022-11-20 DIAGNOSIS — Z1231 Encounter for screening mammogram for malignant neoplasm of breast: Secondary | ICD-10-CM | POA: Diagnosis not present

## 2022-11-25 ENCOUNTER — Other Ambulatory Visit (INDEPENDENT_AMBULATORY_CARE_PROVIDER_SITE_OTHER): Payer: Self-pay | Admitting: Gastroenterology

## 2022-11-25 ENCOUNTER — Telehealth (INDEPENDENT_AMBULATORY_CARE_PROVIDER_SITE_OTHER): Payer: Self-pay

## 2022-11-25 MED ORDER — AMOXICILLIN-POT CLAVULANATE 875-125 MG PO TABS: 1.0000 | ORAL_TABLET | Freq: Two times a day (BID) | ORAL | 0 refills | Status: DC

## 2022-11-25 NOTE — Telephone Encounter (Signed)
Patient made aware of all.  

## 2022-11-25 NOTE — Telephone Encounter (Signed)
Patient called with on going LLQ pain after Diverticulitis treatment. Patient says she finished Cipro and Flagyl around 11/03/2022. She says she was feeling better,but now the pain in the LLQ is back and she is having some abdominal swelling. She says she has been trying to eat a bland diet such as bananas, oatmeal and baked chicken. She has still been taking her metamucil two scoops Qhs. She says she has lost five lbs since she was last seen. She says she has had some issues with her bowel also with one day having constipation and the next diarrhea, then the next it is normal. Patient uses [facility] on [address]. Please advise.

## 2022-12-01 NOTE — Telephone Encounter (Signed)
Please let her know that we could do the colonoscopy with anticoagulation but if we find a polyp we will not be able to remove it.  However, another option would be to discuss with Dr. Phillips Odor if he will clear her to hold anticoagulation for 24 hours only. Thanks

## 2022-12-02 ENCOUNTER — Encounter (INDEPENDENT_AMBULATORY_CARE_PROVIDER_SITE_OTHER): Payer: Self-pay

## 2022-12-02 NOTE — Telephone Encounter (Signed)
Pt left voicemail stating that Dr.Golding is out of office till Monday. Pt states that Dr.Golding told her that if Dr.Castaneda was ok with hold blood thinner for 24 hours, that would be fine. Sent my chart message to pt  (Pt is still on schedule for 12/23/22 for TCS)

## 2022-12-02 NOTE — Telephone Encounter (Signed)
Left message to return call. Sent my chart message

## 2022-12-02 NOTE — Telephone Encounter (Signed)
Pt replied via my chart and she will check with Dr.Golding

## 2022-12-03 NOTE — Telephone Encounter (Signed)
Please see my chart messages

## 2022-12-04 NOTE — Telephone Encounter (Signed)
Pt left voicemail stating she has an appt with Dr.Golding next Thursday at 1:30pm to discuss coming off blood thinner(Xarelto) for 24 hours for colonoscopy.  Returned message to pt to let her know we received voicemail.

## 2022-12-11 ENCOUNTER — Encounter (INDEPENDENT_AMBULATORY_CARE_PROVIDER_SITE_OTHER): Payer: Self-pay

## 2022-12-11 DIAGNOSIS — F17200 Nicotine dependence, unspecified, uncomplicated: Secondary | ICD-10-CM | POA: Diagnosis not present

## 2022-12-11 DIAGNOSIS — G35 Multiple sclerosis: Secondary | ICD-10-CM | POA: Diagnosis not present

## 2022-12-11 DIAGNOSIS — Z681 Body mass index (BMI) 19 or less, adult: Secondary | ICD-10-CM | POA: Diagnosis not present

## 2022-12-11 DIAGNOSIS — I82401 Acute embolism and thrombosis of unspecified deep veins of right lower extremity: Secondary | ICD-10-CM | POA: Diagnosis not present

## 2022-12-11 DIAGNOSIS — E538 Deficiency of other specified B group vitamins: Secondary | ICD-10-CM | POA: Diagnosis not present

## 2022-12-11 DIAGNOSIS — E559 Vitamin D deficiency, unspecified: Secondary | ICD-10-CM | POA: Diagnosis not present

## 2022-12-11 NOTE — Telephone Encounter (Signed)
Pt sent my chart message with clearance for Xarelto  Have sent instructions via my chart.      [name]  to Me     12/11/22  1:42 PM Hey [name] I am at Dr.Goldings office now.He said I could go off the blood thinner for 3 days Sunday,Monday and Tuesday and start back after the colonoscopy is done Do you need it in writing? I am getting a note if you need it.I am still here

## 2022-12-11 NOTE — Telephone Encounter (Signed)
Thanks, she only needs to hold it for 48 hours max Thanks

## 2022-12-11 NOTE — Telephone Encounter (Signed)
Mychart message sent to patient.

## 2022-12-15 NOTE — Telephone Encounter (Signed)
Letter from [place] for holding Xarelto in my chart message sent on 12/02/22

## 2022-12-17 ENCOUNTER — Encounter (HOSPITAL_COMMUNITY): Payer: Self-pay | Admitting: Gastroenterology

## 2022-12-19 ENCOUNTER — Encounter (HOSPITAL_COMMUNITY)
Admission: RE | Admit: 2022-12-19 | Discharge: 2022-12-19 | Disposition: A | Discharge status: Home / Self Care | Payer: Medicare PPO | Source: Ambulatory Visit | Attending: Gastroenterology | Admitting: Gastroenterology

## 2022-12-23 ENCOUNTER — Ambulatory Visit (HOSPITAL_BASED_OUTPATIENT_CLINIC_OR_DEPARTMENT_OTHER): Payer: Medicare PPO | Admitting: Anesthesiology

## 2022-12-23 ENCOUNTER — Ambulatory Visit (HOSPITAL_COMMUNITY)
Admission: RE | Admit: 2022-12-23 | Discharge: 2022-12-23 | Disposition: A | Discharge status: Home / Self Care | Payer: Medicare PPO | Attending: Gastroenterology | Admitting: Gastroenterology

## 2022-12-23 ENCOUNTER — Ambulatory Visit (HOSPITAL_COMMUNITY): Payer: Medicare PPO | Admitting: Anesthesiology

## 2022-12-23 ENCOUNTER — Encounter (HOSPITAL_COMMUNITY)
Admission: RE | Disposition: A | Discharge status: Home / Self Care | Payer: Self-pay | Source: Home / Self Care | Attending: Gastroenterology

## 2022-12-23 DIAGNOSIS — K5732 Diverticulitis of large intestine without perforation or abscess without bleeding: Secondary | ICD-10-CM | POA: Diagnosis not present

## 2022-12-23 DIAGNOSIS — F32A Depression, unspecified: Secondary | ICD-10-CM | POA: Diagnosis not present

## 2022-12-23 DIAGNOSIS — K5730 Diverticulosis of large intestine without perforation or abscess without bleeding: Secondary | ICD-10-CM | POA: Diagnosis not present

## 2022-12-23 DIAGNOSIS — K5792 Diverticulitis of intestine, part unspecified, without perforation or abscess without bleeding: Secondary | ICD-10-CM | POA: Diagnosis present

## 2022-12-23 DIAGNOSIS — K649 Unspecified hemorrhoids: Secondary | ICD-10-CM | POA: Diagnosis not present

## 2022-12-23 DIAGNOSIS — D123 Benign neoplasm of transverse colon: Secondary | ICD-10-CM | POA: Diagnosis not present

## 2022-12-23 DIAGNOSIS — M1990 Unspecified osteoarthritis, unspecified site: Secondary | ICD-10-CM | POA: Diagnosis not present

## 2022-12-23 DIAGNOSIS — G35 Multiple sclerosis: Secondary | ICD-10-CM | POA: Diagnosis not present

## 2022-12-23 DIAGNOSIS — F17210 Nicotine dependence, cigarettes, uncomplicated: Secondary | ICD-10-CM | POA: Diagnosis not present

## 2022-12-23 DIAGNOSIS — Z09 Encounter for follow-up examination after completed treatment for conditions other than malignant neoplasm: Secondary | ICD-10-CM | POA: Diagnosis not present

## 2022-12-23 DIAGNOSIS — K449 Diaphragmatic hernia without obstruction or gangrene: Secondary | ICD-10-CM | POA: Insufficient documentation

## 2022-12-23 DIAGNOSIS — I10 Essential (primary) hypertension: Secondary | ICD-10-CM | POA: Diagnosis not present

## 2022-12-23 DIAGNOSIS — K635 Polyp of colon: Secondary | ICD-10-CM | POA: Diagnosis not present

## 2022-12-23 DIAGNOSIS — K648 Other hemorrhoids: Secondary | ICD-10-CM | POA: Diagnosis not present

## 2022-12-23 HISTORY — PX: COLONOSCOPY WITH PROPOFOL: SHX5780

## 2022-12-23 HISTORY — PX: POLYPECTOMY: SHX5525

## 2022-12-23 SURGERY — COLONOSCOPY WITH PROPOFOL
Anesthesia: General

## 2022-12-23 MED ORDER — LACTATED RINGERS IV SOLN: INTRAVENOUS | Status: DC | PRN

## 2022-12-23 MED ORDER — STERILE WATER FOR IRRIGATION IR SOLN
Status: DC | PRN
  Administered 2022-12-23: 100 mL

## 2022-12-23 MED ORDER — PROPOFOL 10 MG/ML IV BOLUS
INTRAVENOUS | Status: DC | PRN
  Administered 2022-12-23: 40 mg via INTRAVENOUS
  Administered 2022-12-23: 50 mg via INTRAVENOUS
  Administered 2022-12-23: 100 mg via INTRAVENOUS
  Administered 2022-12-23: 50 mg via INTRAVENOUS

## 2022-12-23 MED ORDER — LIDOCAINE HCL (CARDIAC) PF 100 MG/5ML IV SOSY
PREFILLED_SYRINGE | INTRAVENOUS | Status: DC | PRN
  Administered 2022-12-23: 50 mg via INTRAVENOUS

## 2022-12-23 MED ORDER — LACTATED RINGERS IV SOLN: INTRAVENOUS | Status: DC

## 2022-12-23 MED ORDER — PROPOFOL 500 MG/50ML IV EMUL
INTRAVENOUS | Status: DC | PRN
  Administered 2022-12-23: 150 ug/kg/min via INTRAVENOUS

## 2022-12-23 NOTE — Discharge Instructions (Signed)
You are being discharged to home.  Resume your previous diet.  We are waiting for your pathology results.  Your physician has recommended a repeat colonoscopy (date to be determined after pending pathology results are reviewed) for screening purposes.  Restart Xarelto tonight

## 2022-12-23 NOTE — H&P (Signed)
[name] is an 67 y.o. female.   Chief Complaint: History of diverticulitis HPI: [name] is a 67 y.o. female with past medical history of osteoarthritis, GERD, hiatal hernia, hypertension, multiple sclerosis and seizures, coming for evaluation of diverticulitis episodes.  Patient reports that her abdominal pain has resolved after taking course of antibiotics. The patient denies having any nausea, vomiting, fever, chills, hematochezia, melena, hematemesis, abdominal distention, abdominal pain, diarrhea, jaundice, pruritus or weight loss.   Past Medical History:  Diagnosis Date   Arthritis    Osteoarthritis   Colon polyps    GERD (gastroesophageal reflux disease)    Hiatal hernia    Hypertension    MS (multiple sclerosis) (HCC) 12/09/2013   Multiple sclerosis (HCC)    Seizures (HCC) 01/04/2005   1 and Only    Past Surgical History:  Procedure Laterality Date   COLONOSCOPY     COLONOSCOPY N/A 09/16/2012   Procedure: COLONOSCOPY;  Surgeon: Malissa Hippo, MD;  Location: [facility];  Service: Endoscopy;  Laterality: N/A;  1030-rescheduled to 9:30 Ann notified pt   COLONOSCOPY N/A 01/05/2020   Procedure: COLONOSCOPY;  Surgeon: Malissa Hippo, MD;  Location: [facility];  Service: Endoscopy;  Laterality: N/A;  730   DILATION AND CURETTAGE OF UTERUS     ORIF HUMERUS FRACTURE Left 02/16/2017   Procedure: OPEN REDUCTION INTERNAL FIXATION (ORIF) LEFT PROXIMAL HUMERUS FRACTURE;  Surgeon: Cammy Copa, MD;  Location: [facility] OR;  Service: Orthopedics;  Laterality: Left;   POLYPECTOMY  01/05/2020   Procedure: POLYPECTOMY;  Surgeon: Malissa Hippo, MD;  Location: [facility];  Service: Endoscopy;;    Family History  Problem Relation Age of Onset   Stomach cancer Father    Sick sinus syndrome Father    Colon cancer Neg Hx    Social History:  reports that she has been smoking cigarettes. She has a 22.50 pack-year smoking history. She has been exposed to tobacco smoke. She has  never used smokeless tobacco. She reports current alcohol use of about 8.0 standard drinks of alcohol per week. She reports that she does not use drugs.  Allergies:  Allergies  Allergen Reactions   Sulfa Antibiotics     Unsure    Codeine Nausea And Vomiting   Dilaudid [Hydromorphone Hcl] Nausea And Vomiting    Zofran helped    Medications Prior to Admission  Medication Sig Dispense Refill   Cholecalciferol 50 MCG (2000 UT) CAPS Take 2,000 Units by mouth daily.      Cyanocobalamin (VITAMIN B-12) 3000 MCG SUBL Place 3,000 mcg under the tongue daily.      diazepam (VALIUM) 5 MG tablet Take 5 mg by mouth at bedtime.     losartan (COZAAR) 50 MG tablet Take 50 mg by mouth daily.      Polyethyl Glycol-Propyl Glycol (SYSTANE OP) Place 1 drop into both eyes as needed (dry eyes).     Probiotic Product (PROBIOTIC PO) Take 1 capsule by mouth daily.     Psyllium (METAMUCIL FIBER PO) Take 2 Scoops by mouth at bedtime.      Vilazodone HCl (VIIBRYD) 10 MG TABS Take 1 tablet (10 mg total) by mouth daily. TAKE 1 TABLET BY MOUTH DAILY OR AS DIRECTED BY DOCTOR (Patient taking differently: Take 10 mg by mouth at bedtime. TAKE 1 TABLET BY MOUTH DAILY OR AS DIRECTED BY DOCTOR) 90 tablet 3   vitamin C (ASCORBIC ACID) 250 MG tablet Take 250 mg by mouth daily.  XARELTO 20 MG TABS tablet Take 20 mg by mouth daily.     amoxicillin-clavulanate (AUGMENTIN) 875-125 MG tablet Take 1 tablet by mouth 2 (two) times daily. (Patient not taking: Reported on 12/16/2022) 14 tablet 0   PEG-KCl-NaCl-NaSulf-Na Asc-C (PLENVU) 140 g SOLR Use as directed 3 each 0   RIVAROXABAN (XARELTO) VTE STARTER PACK (15 & 20 MG) Follow package directions: Take one 15mg  tablet by mouth twice a day. On day 22, switch to one 20mg  tablet once a day. Take with food. (Patient not taking: Reported on 12/16/2022) 51 each 0    No results found for this or any previous visit (from the past 48 hour(s)). No results found.  Review of Systems  All other  systems reviewed and are negative.   Blood pressure 122/76, pulse (!) 55, temperature (!) 97.1 F (36.2 C), resp. rate 13, SpO2 100 %. Physical Exam  GENERAL: The patient is AO x3, in no acute distress. HEENT: Head is normocephalic and atraumatic. EOMI are intact. Mouth is well hydrated and without lesions. NECK: Supple. No masses LUNGS: Clear to auscultation. No presence of rhonchi/wheezing/rales. Adequate chest expansion HEART: RRR, normal s1 and s2. ABDOMEN: Soft, nontender, no guarding, no peritoneal signs, and nondistended. BS +. No masses. EXTREMITIES: Without any cyanosis, clubbing, rash, lesions or edema. NEUROLOGIC: AOx3, no focal motor deficit. SKIN: no jaundice, no rashes  Assessment/Plan [name] is a 68 y.o. female with past medical history of osteoarthritis, GERD, hiatal hernia, hypertension, multiple sclerosis and seizures, coming for evaluation of diverticulitis episodes.  Will proceed with colonoscopy.  Dolores Frame, MD 12/23/2022, 7:38 AM

## 2022-12-23 NOTE — Anesthesia Procedure Notes (Signed)
Date/Time: 12/23/2022 8:42 AM  Performed by: Julian Reil, CRNAPre-anesthesia Checklist: Patient identified, Emergency Drugs available, Suction available and Patient being monitored Patient Re-evaluated:Patient Re-evaluated prior to induction Oxygen Delivery Method: Nasal cannula Induction Type: IV induction Placement Confirmation: positive ETCO2

## 2022-12-23 NOTE — Anesthesia Preprocedure Evaluation (Signed)
Anesthesia Evaluation  Patient identified by MRN, date of birth, ID band Patient awake    Reviewed: Allergy & Precautions, H&P , NPO status , Patient's Chart, lab work & pertinent test results, reviewed documented beta blocker date and time   Airway Mallampati: II  TM Distance: >3 FB Neck ROM: full    Dental no notable dental hx.    Pulmonary neg pulmonary ROS, Current Smoker and Patient abstained from smoking.   Pulmonary exam normal breath sounds clear to auscultation       Cardiovascular Exercise Tolerance: Good hypertension, negative cardio ROS  Rhythm:regular Rate:Normal     Neuro/Psych Seizures -,  PSYCHIATRIC DISORDERS  Depression     Neuromuscular disease negative neurological ROS  negative psych ROS   GI/Hepatic negative GI ROS, Neg liver ROS, hiatal hernia,GERD  ,,  Endo/Other  negative endocrine ROS    Renal/GU negative Renal ROS  negative genitourinary   Musculoskeletal   Abdominal   Peds  Hematology negative hematology ROS (+)   Anesthesia Other Findings   Reproductive/Obstetrics negative OB ROS                             Anesthesia Physical Anesthesia Plan  ASA: 2  Anesthesia Plan: General   Post-op Pain Management:    Induction:   PONV Risk Score and Plan: Propofol infusion  Airway Management Planned:   Additional Equipment:   Intra-op Plan:   Post-operative Plan:   Informed Consent: I have reviewed the patients History and Physical, chart, labs and discussed the procedure including the risks, benefits and alternatives for the proposed anesthesia with the patient or authorized representative who has indicated his/her understanding and acceptance.     Dental Advisory Given  Plan Discussed with: CRNA  Anesthesia Plan Comments:        Anesthesia Quick Evaluation

## 2022-12-23 NOTE — Transfer of Care (Signed)
Immediate Anesthesia Transfer of Care Note  Patient: [name]  Procedure(s) Performed: COLONOSCOPY WITH PROPOFOL POLYPECTOMY  Patient Location: Short Stay  Anesthesia Type:General  Level of Consciousness: drowsy  Airway & Oxygen Therapy: Patient Spontanous Breathing  Post-op Assessment: Report given to RN and Post -op Vital signs reviewed and stable  Post vital signs: Reviewed and stable  Last Vitals:  Vitals Value Taken Time  BP    Temp    Pulse    Resp    SpO2      Last Pain:  Vitals:   12/23/22 0839  PainSc: 0-No pain         Complications: No notable events documented.

## 2022-12-23 NOTE — Op Note (Signed)
[facility] Patient Name: [name] Procedure Date: 12/23/2022 8:28 AM MRN: [id number] Date of Birth: December 15, 1955 Attending MD: Katrinka Blazing , , [id number] CSN: [id number] Age: 67 Admit Type: Outpatient Procedure:                Colonoscopy Indications:              Follow-up of diverticulitis Providers:                Katrinka Blazing, Sheran Fava, Dyann Ruddle Referring MD:              Medicines:                Monitored Anesthesia Care Complications:            No immediate complications. Estimated Blood Loss:     Estimated blood loss: none. Procedure:                Pre-Anesthesia Assessment:                           - Prior to the procedure, a History and Physical                            was performed, and patient medications, allergies                            and sensitivities were reviewed. The patient's                            tolerance of previous anesthesia was reviewed.                           - The risks and benefits of the procedure and the                            sedation options and risks were discussed with the                            patient. All questions were answered and informed                            consent was obtained.                           - ASA Grade Assessment: II - A patient with mild                            systemic disease.                           After obtaining informed consent, the colonoscope                            was passed under direct vision. Throughout the                            procedure, the patient's blood pressure, pulse, and  oxygen saturations were monitored continuously. The                            [id number]-[id number] ([id number]) scope was introduced through                            the anus and advanced to the the cecum, identified                            by appendiceal orifice and ileocecal valve. The                            colonoscopy was performed without  difficulty. The                            patient tolerated the procedure well. The quality                            of the bowel preparation was good. Scope In: 8:48:02 AM Scope Out: 9:13:35 AM Scope Withdrawal Time: 0 hours 12 minutes 14 seconds  Total Procedure Duration: 0 hours 25 minutes 33 seconds  Findings:      The perianal and digital rectal examinations were normal.      Three sessile polyps were found in the transverse colon. The polyps were       3 to 6 mm in size. These polyps were removed with a cold snare.       Resection and retrieval were complete.      Scattered large-mouthed and small-mouthed diverticula were found in the       sigmoid colon and descending colon.      Non-bleeding internal hemorrhoids were found during retroflexion. The       hemorrhoids were small. Impression:               - Three 3 to 6 mm polyps in the transverse colon,                            removed with a cold snare. Resected and retrieved.                           - Diverticulosis in the sigmoid colon and in the                            descending colon.                           - Non-bleeding internal hemorrhoids. Moderate Sedation:      Per Anesthesia Care Recommendation:           - Discharge patient to home (ambulatory).                           - Resume previous diet.                           - Await pathology results.                           -  Repeat colonoscopy date to be determined after                            pending pathology results are reviewed for                            screening purposes.                           -Restart Xarelto tonight Procedure Code(s):        --- Professional ---                           [phone], Colonoscopy, flexible; with removal of                            tumor(s), polyp(s), or other lesion(s) by snare                            technique Diagnosis Code(s):        --- Professional ---                           D12.3, Benign  neoplasm of transverse colon (hepatic                            flexure or splenic flexure)                           K64.8, Other hemorrhoids                           K57.32, Diverticulitis of large intestine without                            perforation or abscess without bleeding                           K57.30, Diverticulosis of large intestine without                            perforation or abscess without bleeding CPT copyright 2022 American Medical Association. All rights reserved. The codes documented in this report are preliminary and upon coder review may  be revised to meet current compliance requirements. Katrinka Blazing, MD Katrinka Blazing,  12/23/2022 9:23:34 AM This report has been signed electronically. Number of Addenda: 0

## 2022-12-25 NOTE — Anesthesia Postprocedure Evaluation (Signed)
Anesthesia Post Note  Patient: [name]  Procedure(s) Performed: COLONOSCOPY WITH PROPOFOL POLYPECTOMY  Patient location during evaluation: Phase II Anesthesia Type: General Level of consciousness: awake Pain management: pain level controlled Vital Signs Assessment: post-procedure vital signs reviewed and stable Respiratory status: spontaneous breathing and respiratory function stable Cardiovascular status: blood pressure returned to baseline and stable Postop Assessment: no headache and no apparent nausea or vomiting Anesthetic complications: no Comments: Late entry   No notable events documented.   Last Vitals:  Vitals:   12/23/22 0919 12/23/22 0925  BP: (!) 89/61 111/77  Pulse: 67   Resp: 15   Temp: (!) 36.3 C   SpO2: 97%     Last Pain:  Vitals:   12/24/22 0946  TempSrc:   PainSc: 0-No pain                 Windell Norfolk

## 2022-12-26 LAB — SURGICAL PATHOLOGY

## 2022-12-29 ENCOUNTER — Encounter (INDEPENDENT_AMBULATORY_CARE_PROVIDER_SITE_OTHER): Payer: Self-pay | Admitting: *Deleted

## 2022-12-30 NOTE — Progress Notes (Signed)
Diverticulits has healed

## 2022-12-31 ENCOUNTER — Encounter (HOSPITAL_COMMUNITY): Payer: Self-pay | Admitting: Gastroenterology

## 2023-04-14 DIAGNOSIS — Z23 Encounter for immunization: Secondary | ICD-10-CM | POA: Diagnosis not present

## 2023-05-05 ENCOUNTER — Other Ambulatory Visit: Payer: Self-pay | Admitting: Family Medicine

## 2023-05-05 DIAGNOSIS — G35 Multiple sclerosis: Secondary | ICD-10-CM

## 2023-05-05 NOTE — Telephone Encounter (Signed)
I called pt relayed Amy's note.  She is asking for a low dose taper (6 day).  Send to [facility] please.

## 2023-05-05 NOTE — Telephone Encounter (Signed)
Pt called needing to speak to the provider. She states that she is having a flare up and is wanting to be advised.

## 2023-05-05 NOTE — Telephone Encounter (Signed)
I called pt back. She said day before clock hit her L foot and then started the next day.  She said that she has R leg/Rfoot numbness (states hard to drive).  No vision, UTI sx, any underlying infection, no weakness.  No new medications.  She is not on copaxone.  She said she has not had a flare she seemed to think for about 12 yrs.  She says it feels like MS exacerbation.   She has had prednisone before. Last MRI 09/2020, from last note.  She has not contacted her pcp.  Annual appt 09/2023 with Dr. Vickey Huger.

## 2023-05-06 MED ORDER — PREDNISONE 10 MG (21) PO TBPK
ORAL_TABLET | ORAL | 0 refills | Status: DC
Start: 2023-05-06 — End: 2023-05-19

## 2023-05-06 NOTE — Telephone Encounter (Signed)
This is Amy's message:    I sent prednisone taper. I have ordered an MRI for evaluation and can you please help her get in sooner with Dr Vickey Huger for evaluation please?  I have sent a mychart email to pt.

## 2023-05-07 ENCOUNTER — Telehealth: Payer: Self-pay | Admitting: Family Medicine

## 2023-05-07 DIAGNOSIS — G35 Multiple sclerosis: Secondary | ICD-10-CM | POA: Diagnosis not present

## 2023-05-07 DIAGNOSIS — M461 Sacroiliitis, not elsewhere classified: Secondary | ICD-10-CM | POA: Diagnosis not present

## 2023-05-07 DIAGNOSIS — Z681 Body mass index (BMI) 19 or less, adult: Secondary | ICD-10-CM | POA: Diagnosis not present

## 2023-05-07 DIAGNOSIS — I10 Essential (primary) hypertension: Secondary | ICD-10-CM | POA: Diagnosis not present

## 2023-05-07 DIAGNOSIS — N390 Urinary tract infection, site not specified: Secondary | ICD-10-CM | POA: Diagnosis not present

## 2023-05-07 NOTE — Telephone Encounter (Signed)
Pt called stating that it was confirmed that she does have a UTI so she is wondering if she should just come in April or still come in on Dec. Please advise.

## 2023-05-07 NOTE — Telephone Encounter (Signed)
I spoke to pt in another message that she is asking if needs this appt if she has UTI.

## 2023-05-07 NOTE — Telephone Encounter (Signed)
Pt did go to pcp, and she has UTI on cipro  and had injection of steroids .  She has oral steroids and will complete them as well (per her pcp) and her  symptoms of foot/leg resolving.  She is questioning now about keeping Dec appt that was offered or keeping April appt.  She will proceed with MRI.  She is asking our thoughts.

## 2023-05-07 NOTE — Telephone Encounter (Signed)
Amy NP says ::  As long as she continues to improve she can wait until April but have her call with any concerns and we can put her on wait list for sooner appt with Dr Vickey Huger. If MRI shows anything concerning we will get her scheduled sooner.  I sent pt message in mychart.

## 2023-05-07 NOTE — Telephone Encounter (Signed)
See other phone note

## 2023-05-07 NOTE — Telephone Encounter (Signed)
Cohere Berkley Harvey: [phone] exp. 05/07/23-07/06/23 sent to GI [phone]

## 2023-05-11 NOTE — Telephone Encounter (Signed)
MRI order sent to [facility] [phone]

## 2023-05-11 NOTE — Telephone Encounter (Signed)
Pt requesting MRI sent to [facility] or where there is open MRI.

## 2023-05-14 DIAGNOSIS — N390 Urinary tract infection, site not specified: Secondary | ICD-10-CM | POA: Diagnosis not present

## 2023-05-17 ENCOUNTER — Encounter (HOSPITAL_COMMUNITY): Payer: Self-pay

## 2023-05-17 ENCOUNTER — Emergency Department (HOSPITAL_COMMUNITY): Payer: Medicare PPO

## 2023-05-17 ENCOUNTER — Other Ambulatory Visit: Payer: Self-pay

## 2023-05-17 ENCOUNTER — Emergency Department (HOSPITAL_COMMUNITY)
Admission: EM | Admit: 2023-05-17 | Discharge: 2023-05-17 | Disposition: A | Discharge status: Home / Self Care | Payer: Medicare PPO | Attending: Emergency Medicine | Admitting: Emergency Medicine

## 2023-05-17 DIAGNOSIS — X500XXA Overexertion from strenuous movement or load, initial encounter: Secondary | ICD-10-CM | POA: Insufficient documentation

## 2023-05-17 DIAGNOSIS — E871 Hypo-osmolality and hyponatremia: Secondary | ICD-10-CM | POA: Diagnosis not present

## 2023-05-17 DIAGNOSIS — S39012A Strain of muscle, fascia and tendon of lower back, initial encounter: Secondary | ICD-10-CM | POA: Insufficient documentation

## 2023-05-17 DIAGNOSIS — Z7901 Long term (current) use of anticoagulants: Secondary | ICD-10-CM | POA: Diagnosis not present

## 2023-05-17 DIAGNOSIS — S32029A Unspecified fracture of second lumbar vertebra, initial encounter for closed fracture: Secondary | ICD-10-CM | POA: Diagnosis not present

## 2023-05-17 DIAGNOSIS — K5730 Diverticulosis of large intestine without perforation or abscess without bleeding: Secondary | ICD-10-CM | POA: Diagnosis not present

## 2023-05-17 DIAGNOSIS — M4316 Spondylolisthesis, lumbar region: Secondary | ICD-10-CM | POA: Diagnosis not present

## 2023-05-17 DIAGNOSIS — M5136 Other intervertebral disc degeneration, lumbar region with discogenic back pain only: Secondary | ICD-10-CM | POA: Diagnosis not present

## 2023-05-17 DIAGNOSIS — M545 Low back pain, unspecified: Secondary | ICD-10-CM | POA: Diagnosis present

## 2023-05-17 LAB — CBC WITH DIFFERENTIAL/PLATELET
Abs Immature Granulocytes: 0.04 10*3/uL (ref 0.00–0.07)
Basophils Absolute: 0.1 10*3/uL (ref 0.0–0.1)
Basophils Relative: 1 %
Eosinophils Absolute: 0.1 10*3/uL (ref 0.0–0.5)
Eosinophils Relative: 2 %
HCT: 43.5 % (ref 36.0–46.0)
Hemoglobin: 14.6 g/dL (ref 12.0–15.0)
Immature Granulocytes: 1 %
Lymphocytes Relative: 17 %
Lymphs Abs: 0.9 10*3/uL (ref 0.7–4.0)
MCH: 34 pg (ref 26.0–34.0)
MCHC: 33.6 g/dL (ref 30.0–36.0)
MCV: 101.4 fL — ABNORMAL HIGH (ref 80.0–100.0)
Monocytes Absolute: 0.5 10*3/uL (ref 0.1–1.0)
Monocytes Relative: 9 %
Neutro Abs: 3.9 10*3/uL (ref 1.7–7.7)
Neutrophils Relative %: 70 %
Platelets: 235 10*3/uL (ref 150–400)
RBC: 4.29 MIL/uL (ref 3.87–5.11)
RDW: 12.5 % (ref 11.5–15.5)
WBC: 5.6 10*3/uL (ref 4.0–10.5)
nRBC: 0 % (ref 0.0–0.2)

## 2023-05-17 LAB — URINALYSIS, W/ REFLEX TO CULTURE (INFECTION SUSPECTED)
Bacteria, UA: NONE SEEN
Bilirubin Urine: NEGATIVE
Glucose, UA: NEGATIVE mg/dL
Hgb urine dipstick: NEGATIVE
Ketones, ur: NEGATIVE mg/dL
Leukocytes,Ua: NEGATIVE
Nitrite: NEGATIVE
Protein, ur: NEGATIVE mg/dL
Specific Gravity, Urine: 1.004 — ABNORMAL LOW (ref 1.005–1.030)
pH: 6 (ref 5.0–8.0)

## 2023-05-17 LAB — BASIC METABOLIC PANEL
Anion gap: 9 (ref 5–15)
BUN: 6 mg/dL — ABNORMAL LOW (ref 8–23)
CO2: 23 mmol/L (ref 22–32)
Calcium: 8.9 mg/dL (ref 8.9–10.3)
Chloride: 93 mmol/L — ABNORMAL LOW (ref 98–111)
Creatinine, Ser: 0.77 mg/dL (ref 0.44–1.00)
GFR, Estimated: >60 mL/min (ref >60)
Glucose, Bld: 107 mg/dL — ABNORMAL HIGH (ref 70–99)
Potassium: 4.4 mmol/L (ref 3.5–5.1)
Sodium: 125 mmol/L — ABNORMAL LOW (ref 135–145)

## 2023-05-17 MED ORDER — METHOCARBAMOL 500 MG PO TABS: 500.0000 mg | ORAL_TABLET | Freq: Two times a day (BID) | ORAL | 0 refills | Status: DC

## 2023-05-17 MED ORDER — METHOCARBAMOL 500 MG PO TABS
500.0000 mg | ORAL_TABLET | Freq: Once | ORAL | Status: AC
  Administered 2023-05-17: 500 mg via ORAL
  Filled 2023-05-17: qty 1

## 2023-05-17 MED ORDER — SODIUM CHLORIDE 0.9 % IV BOLUS
1000.0000 mL | Freq: Once | INTRAVENOUS | Status: AC
  Administered 2023-05-17: 1000 mL via INTRAVENOUS

## 2023-05-17 NOTE — ED Provider Notes (Signed)
[place] EMERGENCY DE[facility]MENT AT [facility] Provider Note   CSN: [id number] Arrival date & time: 05/17/23  1025     History  Chief Complaint  Patient presents with   Back Pain    [name] is a 67 y.o. female with a history of multiple sclerosis, seizures, and GERD who presents the ED today for back pain.  Patient reports that she has been having low back pain for the past 2 weeks.  Patient reports something heavy boxes prior to the onset of pain.  Additionally, she states that she has been taking antibiotics for urinary tract infection.  Initially she was on ciprofloxacin but now is on Bactrim due to urinary frequency.  No fevers, abdominal pain, N/V, radiating pain, hematuria, or dysuria.  No lower extremity weakness, saddle anesthesia, or incontinence. She denies any other complaints or concerns at this time.    Home Medications Prior to Admission medications   Medication Sig Start Date End Date Taking? Authorizing Provider  methocarbamol (ROBAXIN) 500 MG tablet Take 1 tablet (500 mg total) by mouth 2 (two) times daily for 5 days. 05/17/23 05/22/23 Yes Maxwell Marion, PA-C  amoxicillin-clavulanate (AUGMENTIN) 875-125 MG tablet Take 1 tablet by mouth 2 (two) times daily. Patient not taking: Reported on 12/16/2022 11/25/22   Raquel James, NP  Cholecalciferol 50 MCG (2000 UT) CAPS Take 2,000 Units by mouth daily.     [provider]  Cyanocobalamin (VITAMIN B-12) 3000 MCG SUBL Place 3,000 mcg under the tongue daily.     [provider]  diazepam (VALIUM) 5 MG tablet Take 5 mg by mouth at bedtime.    [provider]  losartan (COZAAR) 50 MG tablet Take 50 mg by mouth daily.     [provider]  PEG-KCl-NaCl-NaSulf-Na Asc-C (PLENVU) 140 g SOLR Use as directed 10/30/22   Marguerita Merles, Reuel Boom, MD  Polyethyl Glycol-Propyl Glycol (SYSTANE OP) Place 1 drop into both eyes as needed (dry eyes).    [provider]  predniSONE  (STERAPRED UNI-PAK 21 TAB) 10 MG (21) TBPK tablet Take taper pack as directed 05/06/23   Lomax, Amy, NP  Probiotic Product (PROBIOTIC PO) Take 1 capsule by mouth daily.    [provider]  Psyllium (METAMUCIL FIBER PO) Take 2 Scoops by mouth at bedtime.     [provider]  RIVAROXABAN Carlena Hurl) VTE STARTER PACK (15 & 20 MG) Follow package directions: Take one 15mg  tablet by mouth twice a day. On day 22, switch to one 20mg  tablet once a day. Take with food. Patient not taking: Reported on 12/16/2022 08/14/22   Mesner, Barbara Cower, MD  Vilazodone HCl (VIIBRYD) 10 MG TABS Take 1 tablet (10 mg total) by mouth daily. TAKE 1 TABLET BY MOUTH DAILY OR AS DIRECTED BY DOCTOR Patient taking differently: Take 10 mg by mouth at bedtime. TAKE 1 TABLET BY MOUTH DAILY OR AS DIRECTED BY DOCTOR 10/08/22   Lomax, Amy, NP  vitamin C (ASCORBIC ACID) 250 MG tablet Take 250 mg by mouth daily.    [provider]  XARELTO 20 MG TABS tablet Take 20 mg by mouth daily. 12/02/22   [provider]      Allergies    Sulfa antibiotics, Codeine, and Dilaudid [hydromorphone hcl]    Review of Systems   Review of Systems  Musculoskeletal:  Positive for back pain.  All other systems reviewed and are negative.   Physical Exam Updated Vital Signs BP (!) 141/87 (BP Location: Left Arm)  Pulse (!) 58   Temp 97.9 F (36.6 C) (Oral)   Resp 18   Ht 5\' 4"  (1.626 m)   Wt 51.3 kg   SpO2 100%   BMI 19.40 kg/m  Physical Exam Vitals and nursing note reviewed.  Constitutional:      General: She is not in acute distress.    Appearance: Normal appearance.  HENT:     Head: Normocephalic and atraumatic.     Mouth/Throat:     Mouth: Mucous membranes are moist.  Eyes:     Conjunctiva/sclera: Conjunctivae normal.     Pupils: Pupils are equal, round, and reactive to light.  Cardiovascular:     Rate and Rhythm: Normal rate and regular rhythm.     Pulses: Normal pulses.     Heart sounds: Normal heart  sounds.  Pulmonary:     Effort: Pulmonary effort is normal.     Breath sounds: Normal breath sounds.  Abdominal:     Palpations: Abdomen is soft.     Tenderness: There is no abdominal tenderness.  Musculoskeletal:        General: Tenderness present.     Comments: Midline tenderness to palpation of the lumbar spine without step-off or deformity. Bilateral lower flank tenderness. Strength, sensation, and ROM of upper and lower extremities intact.  Skin:    General: Skin is warm and dry.     Findings: No rash.  Neurological:     General: No focal deficit present.     Mental Status: She is alert.     Sensory: No sensory deficit.     Motor: No weakness.  Psychiatric:        Mood and Affect: Mood normal.        Behavior: Behavior normal.    ED Results / Procedures / Treatments   Labs (all labs ordered are listed, but only abnormal results are displayed) Labs Reviewed  URINALYSIS, W/ REFLEX TO CULTURE (INFECTION SUSPECTED) - Abnormal; Notable for the following components:      Result Value   Color, Urine STRAW (*)    Specific Gravity, Urine 1.004 (*)    All other components within normal limits  BASIC METABOLIC PANEL - Abnormal; Notable for the following components:   Sodium 125 (*)    Chloride 93 (*)    Glucose, Bld 107 (*)    BUN 6 (*)    All other components within normal limits  CBC WITH DIFFERENTIAL/PLATELET - Abnormal; Notable for the following components:   MCV 101.4 (*)    All other components within normal limits    EKG None  Radiology CT Lumbar Spine Wo Contrast  Result Date: 05/17/2023 CLINICAL DATA:  67 year old female taking antibiotics for UTI. Heavy lifting several weeks ago, low back pain. EXAM: CT LUMBAR SPINE WITHOUT CONTRAST TECHNIQUE: Multidetector CT imaging of the lumbar spine was performed without intravenous contrast administration. Multiplanar CT image reconstructions were also generated. RADIATION DOSE REDUCTION: This exam was performed according  to the departmental dose-optimization program which includes automated exposure control, adjustment of the mA and/or kV according to patient size and/or use of iterative reconstruction technique. COMPARISON:  CT Abdomen and Pelvis 10/28/2022. FINDINGS: Segmentation: Normal. Alignment: Stable lumbar lordosis since May. Subtle grade 1 anterolisthesis of L3 on L4. No scoliosis. Vertebrae: Chronic osteopenia. Maintained vertebral height. Small chronic avulsion fracture of the right L2 transverse process is stable on series 3, image 42. No acute osseous abnormality identified. Visible sacrum and SI joints appear intact. Paraspinal and other soft  tissues: Aortoiliac calcified atherosclerosis. Normal caliber abdominal aorta. Large bowel retained stool and extensive distal large bowel diverticulosis at the pelvis. Lumbar paraspinal soft tissues appear negative. Disc levels: T11-T12: Vacuum disc.  But no evidence of stenosis. T12-L1: Vacuum disc. Mild circumferential disc bulge. No convincing stenosis. L1-L2:  Negative. L2-L3: Mild to moderate circumferential disc bulge. Mild to moderate facet hypertrophy. Mild spinal, lateral recess, and foraminal stenosis. L3-L4: Subtle anterolisthesis. Circumferential disc bulge. Mild to moderate ligament flavum and moderate to severe facet hypertrophy. Up to moderate spinal stenosis (series 4, image 67) with symmetric lateral recess stenosis. Mild to moderate bilateral L3 foraminal stenosis. L4-L5: Circumferential disc bulge asymmetric to the right. Moderate to severe facet and ligament flavum hypertrophy. Moderate spinal stenosis and lateral recess stenosis suspected (series 4, image 83). Mild left and mild to moderate right L4 foraminal stenosis. L5-S1: Less pronounced circumferential disc bulge. Moderate facet hypertrophy. No convincing stenosis. IMPRESSION: 1. No acute osseous abnormality in the Lumbar Spine. Small chronic right L2 transverse process fracture. 2. Subtle  anterolisthesis of L3 on L4. Multilevel disc and facet degeneration. Up to moderate multilevel spinal stenosis, likely most pronounced at L4-L5. Associated lateral recess and foraminal stenosis at the affected levels. 3. Aortic Atherosclerosis (ICD10-I70.0). Large bowel diverticulosis. Electronically Signed   By: Odessa Fleming M.D.   On: 05/17/2023 12:11    Procedures Procedures: not indicated.   Medications Ordered in ED Medications  sodium chloride 0.9 % bolus 1,000 mL (0 mLs Intravenous Stopped 05/17/23 1330)  methocarbamol (ROBAXIN) tablet 500 mg (500 mg Oral Given 05/17/23 1150)    ED Course/ Medical Decision Making/ A&P                                 Medical Decision Making Amount and/or Complexity of Data Reviewed Labs: ordered. Radiology: ordered.  Risk Prescription drug management.   This patient presents to the ED for concern of back pain, this involves an extensive number of treatment options, and is a complaint that carries with it a high risk of complications and morbidity.   Differential diagnosis includes: fracture, misalignment, bulging disc, radiculopathy, muscle strain, etc.   Comorbidities  See HPI above   Additional History  Additional history obtained from prior records.   Lab Tests  I ordered and personally interpreted labs.  The pertinent results include:   Sodium of 125 UA shows no signs of infection   Imaging Studies  I ordered imaging studies including CT lumbar spine  I independently visualized and interpreted imaging which showed: no acute osseous abnormality. Multievel disc and facet degeneration.  I agree with the radiologist interpretation   Problem List / ED Course / Critical Interventions / Medication Management  Low back pain I ordered medications including: Robaxin for pain  1L NS for hyponatremia Reevaluation of the patient after these medicines showed that the patient resolved I have reviewed the patients home medicines and  have made adjustments as needed   Social Determinants of Health  Physical activity    Test / Admission - Considered  Discussed findings with patient.  All questions were answered. She is hemodynamically stable and safe for discharge home. Return precautions given.       Final Clinical Impression(s) / ED Diagnoses Final diagnoses:  Strain of lumbar region, initial encounter  Hyponatremia    Rx / DC Orders ED Discharge Orders          Ordered  methocarbamol (ROBAXIN) 500 MG tablet  2 times daily        05/17/23 1251              Maxwell Marion, PA-C 05/17/23 1545    Gloris Manchester, MD 05/17/23 [phone]

## 2023-05-17 NOTE — Discharge Instructions (Addendum)
As discussed, your labs show a low sodium level of 125 for which we gave you fluids to help raise. Follow up with your PCP in the next several days for reevaluation.  Your imaging showed herniated disc at L4.  Alternate between ibuprofen and Tylenol every 4 hours as needed for pain.  If pain persist, take Robaxin twice a day as needed for breakthrough pain.  Provided information for orthopedics to follow-up with if back pain continues.  Get help right away if: You have a seizure. You faint. You keep having watery poop. You keep vomiting.

## 2023-05-17 NOTE — ED Triage Notes (Signed)
Pt to er, pt states that she has been taking abx for a uti, for the past two weeks, states that she is here now for some lower back pain.  States that she was taking cipro and is now on bactrim.

## 2023-05-19 ENCOUNTER — Observation Stay (HOSPITAL_COMMUNITY)
Admission: EM | Admit: 2023-05-19 | Discharge: 2023-05-20 | Disposition: A | Discharge status: Home / Self Care | Payer: Medicare PPO | Attending: Internal Medicine | Admitting: Internal Medicine

## 2023-05-19 ENCOUNTER — Other Ambulatory Visit: Payer: Self-pay

## 2023-05-19 ENCOUNTER — Encounter (HOSPITAL_COMMUNITY): Payer: Self-pay | Admitting: Emergency Medicine

## 2023-05-19 DIAGNOSIS — K5792 Diverticulitis of intestine, part unspecified, without perforation or abscess without bleeding: Secondary | ICD-10-CM | POA: Insufficient documentation

## 2023-05-19 DIAGNOSIS — I10 Essential (primary) hypertension: Secondary | ICD-10-CM | POA: Insufficient documentation

## 2023-05-19 DIAGNOSIS — K8689 Other specified diseases of pancreas: Secondary | ICD-10-CM | POA: Insufficient documentation

## 2023-05-19 DIAGNOSIS — E871 Hypo-osmolality and hyponatremia: Secondary | ICD-10-CM | POA: Diagnosis not present

## 2023-05-19 DIAGNOSIS — Z1152 Encounter for screening for COVID-19: Secondary | ICD-10-CM | POA: Insufficient documentation

## 2023-05-19 DIAGNOSIS — I82401 Acute embolism and thrombosis of unspecified deep veins of right lower extremity: Secondary | ICD-10-CM | POA: Insufficient documentation

## 2023-05-19 DIAGNOSIS — R631 Polydipsia: Secondary | ICD-10-CM | POA: Insufficient documentation

## 2023-05-19 DIAGNOSIS — Z7901 Long term (current) use of anticoagulants: Secondary | ICD-10-CM | POA: Diagnosis not present

## 2023-05-19 DIAGNOSIS — G35 Multiple sclerosis: Secondary | ICD-10-CM | POA: Diagnosis not present

## 2023-05-19 DIAGNOSIS — K5732 Diverticulitis of large intestine without perforation or abscess without bleeding: Secondary | ICD-10-CM | POA: Diagnosis present

## 2023-05-19 DIAGNOSIS — Z79899 Other long term (current) drug therapy: Secondary | ICD-10-CM | POA: Insufficient documentation

## 2023-05-19 DIAGNOSIS — E860 Dehydration: Secondary | ICD-10-CM | POA: Diagnosis present

## 2023-05-19 DIAGNOSIS — K869 Disease of pancreas, unspecified: Secondary | ICD-10-CM | POA: Diagnosis present

## 2023-05-19 DIAGNOSIS — F17210 Nicotine dependence, cigarettes, uncomplicated: Secondary | ICD-10-CM | POA: Insufficient documentation

## 2023-05-19 LAB — COMPREHENSIVE METABOLIC PANEL
ALT: 16 U/L (ref 0–44)
AST: 17 U/L (ref 15–41)
Albumin: 3.9 g/dL (ref 3.5–5.0)
Alkaline Phosphatase: 50 U/L (ref 38–126)
Anion gap: 8 (ref 5–15)
BUN: 5 mg/dL — ABNORMAL LOW (ref 8–23)
CO2: 24 mmol/L (ref 22–32)
Calcium: 8.7 mg/dL — ABNORMAL LOW (ref 8.9–10.3)
Chloride: 92 mmol/L — ABNORMAL LOW (ref 98–111)
Creatinine, Ser: 0.76 mg/dL (ref 0.44–1.00)
GFR, Estimated: >60 mL/min (ref >60)
Glucose, Bld: 93 mg/dL (ref 70–99)
Potassium: 4.1 mmol/L (ref 3.5–5.1)
Sodium: 124 mmol/L — ABNORMAL LOW (ref 135–145)
Total Bilirubin: 0.6 mg/dL (ref <1.2)
Total Protein: 6.2 g/dL — ABNORMAL LOW (ref 6.5–8.1)

## 2023-05-19 LAB — RESP PANEL BY RT-PCR (RSV, FLU A&B, COVID)  RVPGX2
Influenza A by PCR: NEGATIVE
Influenza B by PCR: NEGATIVE
Resp Syncytial Virus by PCR: NEGATIVE
SARS Coronavirus 2 by RT PCR: NEGATIVE

## 2023-05-19 LAB — CBC
HCT: 39.9 % (ref 36.0–46.0)
Hemoglobin: 13.9 g/dL (ref 12.0–15.0)
MCH: 35.5 pg — ABNORMAL HIGH (ref 26.0–34.0)
MCHC: 34.8 g/dL (ref 30.0–36.0)
MCV: 101.8 fL — ABNORMAL HIGH (ref 80.0–100.0)
Platelets: 203 10*3/uL (ref 150–400)
RBC: 3.92 MIL/uL (ref 3.87–5.11)
RDW: 12.8 % (ref 11.5–15.5)
WBC: 4.9 10*3/uL (ref 4.0–10.5)
nRBC: 0 % (ref 0.0–0.2)

## 2023-05-19 LAB — URINALYSIS, ROUTINE W REFLEX MICROSCOPIC
Bilirubin Urine: NEGATIVE
Glucose, UA: NEGATIVE mg/dL
Hgb urine dipstick: NEGATIVE
Ketones, ur: NEGATIVE mg/dL
Leukocytes,Ua: NEGATIVE
Nitrite: NEGATIVE
Protein, ur: NEGATIVE mg/dL
Specific Gravity, Urine: 1.001 — ABNORMAL LOW (ref 1.005–1.030)
pH: 6 (ref 5.0–8.0)

## 2023-05-19 LAB — NA AND K (SODIUM & POTASSIUM), RAND UR
Potassium Urine: 11 mmol/L
Sodium, Ur: 19 mmol/L

## 2023-05-19 LAB — OSMOLALITY: Osmolality: 265 mosm/kg — ABNORMAL LOW (ref 275–295)

## 2023-05-19 LAB — TSH: TSH: 1.648 u[IU]/mL (ref 0.350–4.500)

## 2023-05-19 LAB — OSMOLALITY, URINE: Osmolality, Ur: 84 mosm/kg — ABNORMAL LOW (ref 300–900)

## 2023-05-19 LAB — SODIUM, URINE, RANDOM: Sodium, Ur: 19 mmol/L

## 2023-05-19 MED ORDER — ACETAMINOPHEN 325 MG PO TABS
650.0000 mg | ORAL_TABLET | Freq: Four times a day (QID) | ORAL | Status: DC | PRN
  Administered 2023-05-19: 650 mg via ORAL
  Filled 2023-05-19: qty 2

## 2023-05-19 MED ORDER — LACTATED RINGERS IV BOLUS
1000.0000 mL | Freq: Once | INTRAVENOUS | Status: AC
  Administered 2023-05-19: 1000 mL via INTRAVENOUS

## 2023-05-19 MED ORDER — LOSARTAN POTASSIUM 50 MG PO TABS
50.0000 mg | ORAL_TABLET | Freq: Every day | ORAL | Status: DC
  Administered 2023-05-20: 50 mg via ORAL
  Filled 2023-05-19: qty 1

## 2023-05-19 MED ORDER — RIVAROXABAN 20 MG PO TABS: 20.0000 mg | ORAL_TABLET | Freq: Every day | ORAL | Status: DC

## 2023-05-19 MED ORDER — ONDANSETRON HCL 4 MG/2ML IJ SOLN: 4.0000 mg | Freq: Four times a day (QID) | INTRAMUSCULAR | Status: DC | PRN

## 2023-05-19 MED ORDER — KETOROLAC TROMETHAMINE 15 MG/ML IJ SOLN
15.0000 mg | Freq: Once | INTRAMUSCULAR | Status: AC
  Administered 2023-05-19: 15 mg via INTRAVENOUS
  Filled 2023-05-19: qty 1

## 2023-05-19 MED ORDER — ACETAMINOPHEN 650 MG RE SUPP: 650.0000 mg | Freq: Four times a day (QID) | RECTAL | Status: DC | PRN

## 2023-05-19 MED ORDER — DIAZEPAM 5 MG PO TABS
5.0000 mg | ORAL_TABLET | Freq: Every evening | ORAL | Status: DC | PRN
  Administered 2023-05-19: 5 mg via ORAL
  Filled 2023-05-19: qty 1

## 2023-05-19 MED ORDER — ONDANSETRON HCL 4 MG PO TABS: 4.0000 mg | ORAL_TABLET | Freq: Four times a day (QID) | ORAL | Status: DC | PRN

## 2023-05-19 MED ORDER — VILAZODONE HCL 20 MG PO TABS
10.0000 mg | ORAL_TABLET | Freq: Every day | ORAL | Status: DC
  Administered 2023-05-19: 10 mg via ORAL
  Filled 2023-05-19 (×3): qty 0.5

## 2023-05-19 MED ORDER — SODIUM CHLORIDE 1 G PO TABS
1.0000 g | ORAL_TABLET | Freq: Two times a day (BID) | ORAL | Status: DC
  Administered 2023-05-19 – 2023-05-20 (×2): 1 g via ORAL
  Filled 2023-05-19 (×4): qty 1

## 2023-05-19 NOTE — ED Triage Notes (Signed)
Pt c/o dehydration, diarrhea, h/a that started about a week ago states she was seen here Sunday and hasn't gotten any better.

## 2023-05-19 NOTE — ED Notes (Signed)
ED TO INPATIENT HANDOFF REPORT  ED Nurse Name and Phone #: Haig Prophet. RN   S Name/Age/Gender [name] 67 y.o. female Room/Bed: APA05/APA05  Code Status   Code Status: Full Code  Home/SNF/Other Home Patient oriented to: self, place, time, and situation Is this baseline? Yes   Triage Complete: Triage complete  Chief Complaint Hyponatremia [E87.1]  Triage Note Pt c/o dehydration, diarrhea, h/a that started about a week ago states she was seen here Sunday and hasn't gotten any better.   Allergies Allergies  Allergen Reactions   Sulfa Antibiotics     Unsure    Codeine Nausea And Vomiting   Dilaudid [Hydromorphone Hcl] Nausea And Vomiting    Zofran helped    Level of Care/Admitting Diagnosis ED Disposition     ED Disposition  Admit   Condition  --   Comment  Hospital Area: [facility] [[id number]]  Level of Care: Med-Surg [16]  Covid Evaluation: Asymptomatic - no recent exposure (last 10 days) testing not required  Diagnosis: Hyponatremia [198519]  Admitting Physician: Erick Blinks [[id number]]  Attending Physician: Erick Blinks [[id number]]  Certification:: I certify this patient will need inpatient services for at least 2 midnights  Expected Medical Readiness: 05/21/2023          B Medical/Surgery History Past Medical History:  Diagnosis Date   Arthritis    Osteoarthritis   Colon polyps    GERD (gastroesophageal reflux disease)    Hiatal hernia    Hypertension    MS (multiple sclerosis) (HCC) 12/09/2013   Multiple sclerosis (HCC)    Seizures (HCC) 01/04/2005   1 and Only   Past Surgical History:  Procedure Laterality Date   COLONOSCOPY     COLONOSCOPY N/A 09/16/2012   Procedure: COLONOSCOPY;  Surgeon: Malissa Hippo, MD;  [facility];  Service: Endoscopy;  Laterality: N/A;  1030-rescheduled to 9:30 Ann notified pt   COLONOSCOPY N/A 01/05/2020   Procedure: COLONOSCOPY;  Surgeon: Malissa Hippo, MD;  Location: [facility];   Service: Endoscopy;  Laterality: N/A;  730   COLONOSCOPY WITH PROPOFOL N/A 12/23/2022   Procedure: COLONOSCOPY WITH PROPOFOL;  Surgeon: Dolores Frame, MD;  Location: [facility];  Service: Gastroenterology;  Laterality: N/A;  8:30am;ASA 3   DILATION AND CURETTAGE OF UTERUS     ORIF HUMERUS FRACTURE Left 02/16/2017   Procedure: OPEN REDUCTION INTERNAL FIXATION (ORIF) LEFT PROXIMAL HUMERUS FRACTURE;  Surgeon: Cammy Copa, MD;  Location: [facility];  Service: Orthopedics;  Laterality: Left;   POLYPECTOMY  01/05/2020   Procedure: POLYPECTOMY;  Surgeon: Malissa Hippo, MD;  [facility];  Service: Endoscopy;;   POLYPECTOMY  12/23/2022   Procedure: POLYPECTOMY;  Surgeon: Dolores Frame, MD;  [facility]: [facility];  Service: Gastroenterology;;     A IV Location/Drains/Wounds Patient Lines/Drains/Airways Status     Active Line/Drains/Airways     Name Placement date Placement time Site Days   Peripheral IV 05/19/23 20 G 1" Anterior;Right Forearm 05/19/23  1032  Forearm  less than 1            Intake/Output Last 24 hours No intake or output data in the 24 hours ending 05/19/23 1319  Labs/Imaging Results for orders placed or performed during the hospital encounter of 05/19/23 (from the past 48 hour(s))  Resp panel by RT-PCR (RSV, Flu A&B, Covid) Anterior Nasal Swab     Status: None   Collection Time: 05/19/23 10:14 AM  Specimen: Anterior Nasal Swab  Result Value Ref Range   SARS Coronavirus 2 by RT PCR NEGATIVE NEGATIVE    Comment: (NOTE) SARS-CoV-2 target nucleic acids are NOT DETECTED.  The SARS-CoV-2 RNA is generally detectable in upper respiratory specimens during the acute phase of infection. The lowest concentration of SARS-CoV-2 viral copies this assay can detect is 138 copies/mL. A negative result does not preclude SARS-Cov-2 infection and should not be used as the sole basis for treatment or other patient management decisions. A  negative result may occur with  improper specimen collection/handling, submission of specimen other than nasopharyngeal swab, presence of viral mutation(s) within the areas targeted by this assay, and inadequate number of viral copies(<138 copies/mL). A negative result must be combined with clinical observations, patient history, and epidemiological information. The expected result is Negative.  Fact Sheet for Patients:  BloggerCourse.com  Fact Sheet for Healthcare Providers:  SeriousBroker.it  This test is no t yet approved or cleared by the [place] FDA and  has been authorized for detection and/or diagnosis of SARS-CoV-2 by FDA under an Emergency Use Authorization (EUA). This EUA will remain  in effect (meaning this test can be used) for the duration of the COVID-19 declaration under Section 564(b)(1) of the Act, 21 U.S.C.section 360bbb-3(b)(1), unless the authorization is terminated  or revoked sooner.       Influenza A by PCR NEGATIVE NEGATIVE   Influenza B by PCR NEGATIVE NEGATIVE    Comment: (NOTE) The Xpert Xpress SARS-CoV-2/FLU/RSV plus assay is intended as an aid in the diagnosis of influenza from Nasopharyngeal swab specimens and should not be used as a sole basis for treatment. Nasal washings and aspirates are unacceptable for Xpert Xpress SARS-CoV-2/FLU/RSV testing.  Fact Sheet for Patients: BloggerCourse.com  Fact Sheet for Healthcare Providers: SeriousBroker.it  This test is not yet approved or cleared by the [place] FDA and has been authorized for detection and/or diagnosis of SARS-CoV-2 by FDA under an Emergency Use Authorization (EUA). This EUA will remain in effect (meaning this test can be used) for the duration of the COVID-19 declaration under Section 564(b)(1) of the Act, 21 U.S.C. section 360bbb-3(b)(1), unless the authorization is terminated  or revoked.     Resp Syncytial Virus by PCR NEGATIVE NEGATIVE    Comment: (NOTE) Fact Sheet for Patients: BloggerCourse.com  Fact Sheet for Healthcare Providers: SeriousBroker.it  This test is not yet approved or cleared by the [place] FDA and has been authorized for detection and/or diagnosis of SARS-CoV-2 by FDA under an Emergency Use Authorization (EUA). This EUA will remain in effect (meaning this test can be used) for the duration of the COVID-19 declaration under Section 564(b)(1) of the Act, 21 U.S.C. section 360bbb-3(b)(1), unless the authorization is terminated or revoked.  Performed at [facility], [address] [place], [place] [address]   Comprehensive metabolic panel     Status: Abnormal   Collection Time: 05/19/23 10:17 AM  Result Value Ref Range   Sodium 124 (L) 135 - 145 mmol/L   Potassium 4.1 3.5 - 5.1 mmol/L   Chloride 92 (L) 98 - 111 mmol/L   CO2 24 22 - 32 mmol/L   Glucose, Bld 93 70 - 99 mg/dL    Comment: Glucose reference range applies only to samples taken after fasting for at least 8 hours.   BUN 5 (L) 8 - 23 mg/dL   Creatinine, Ser 6.21 0.44 - 1.00 mg/dL   Calcium 8.7 (L) 8.9 - 10.3 mg/dL   Total  Protein 6.2 (L) 6.5 - 8.1 g/dL   Albumin 3.9 3.5 - 5.0 g/dL   AST 17 15 - 41 U/L   ALT 16 0 - 44 U/L   Alkaline Phosphatase 50 38 - 126 U/L   Total Bilirubin 0.6 <1.2 mg/dL   GFR, Estimated >96 >04 mL/min    Comment: (NOTE) Calculated using the CKD-EPI Creatinine Equation (2021)    Anion gap 8 5 - 15    Comment: Performed at [facility], [address], [place], [place] [address]  CBC     Status: Abnormal   Collection Time: 05/19/23 10:17 AM  Result Value Ref Range   WBC 4.9 4.0 - 10.5 K/uL   RBC 3.92 3.87 - 5.11 MIL/uL   Hemoglobin 13.9 12.0 - 15.0 g/dL   HCT 11.9 14.7 - 82.9 %   MCV 101.8 (H) 80.0 - 100.0 fL   MCH 35.5 (H) 26.0 - 34.0 pg   MCHC 34.8 30.0 - 36.0 g/dL   RDW 56.2 13.0 -  86.5 %   Platelets 203 150 - 400 K/uL   nRBC 0.0 0.0 - 0.2 %    Comment: Performed at [facility], [address], [place], [place] [address]  TSH     Status: None   Collection Time: 05/19/23 10:17 AM  Result Value Ref Range   TSH 1.648 0.350 - 4.500 uIU/mL    Comment: Performed by a 3rd Generation assay with a functional sensitivity of <=0.01 uIU/mL. Performed at [facility], [address] [place], [place] [address]   Na and K (sodium & potassium), rand urine     Status: None   Collection Time: 05/19/23 10:35 AM  Result Value Ref Range   Sodium, Ur 19 mmol/L   Potassium Urine 11 mmol/L    Comment: Performed at [facility], [address] [place], [place] [address]  Sodium, urine, random     Status: None   Collection Time: 05/19/23 10:35 AM  Result Value Ref Range   Sodium, Ur 19 mmol/L    Comment: Performed at [facility], [address], [place], [place] [address]  Urinalysis, Routine w reflex microscopic -Urine, Clean Catch     Status: Abnormal   Collection Time: 05/19/23 10:36 AM  Result Value Ref Range   Color, Urine COLORLESS (A) YELLOW   APPearance CLEAR CLEAR   Specific Gravity, Urine 1.001 (L) 1.005 - 1.030   pH 6.0 5.0 - 8.0   Glucose, UA NEGATIVE NEGATIVE mg/dL   Hgb urine dipstick NEGATIVE NEGATIVE   Bilirubin Urine NEGATIVE NEGATIVE   Ketones, ur NEGATIVE NEGATIVE mg/dL   Protein, ur NEGATIVE NEGATIVE mg/dL   Nitrite NEGATIVE NEGATIVE   Leukocytes,Ua NEGATIVE NEGATIVE    Comment: Performed at [facility], [address] [place], [place] [address]   No results found.  Pending Labs Unresulted Labs (From admission, onward)     Start     Ordered   05/20/23 0500  Magnesium  Tomorrow morning,   R        05/19/23 1305   05/20/23 0500  Basic metabolic panel  Tomorrow morning,   R        05/19/23 1305   05/19/23 1305  HIV Antibody (routine testing w rflx)  (HIV Antibody (Routine testing w reflex) panel)  Once,   R        05/19/23 1305   05/19/23 1047   Osmolality  Once,   URGENT        05/19/23 1046  05/19/23 1035  Osmolality, urine  Once,   STAT        05/19/23 1035            Vitals/Pain Today's Vitals   05/19/23 1100 05/19/23 1115 05/19/23 1200 05/19/23 1300  BP: 128/72  126/67 133/85  Pulse: 66 67 60 61  Resp:      SpO2: 100% 97% 97% 96%  Weight:      Height:      PainSc:        Isolation Precautions No active isolations  Medications Medications  sodium chloride tablet 1 g (has no administration in time range)  losartan (COZAAR) tablet 50 mg (has no administration in time range)  diazepam (VALIUM) tablet 5 mg (has no administration in time range)  Vilazodone HCl TABS 10 mg (has no administration in time range)  rivaroxaban (XARELTO) tablet 20 mg (has no administration in time range)  acetaminophen (TYLENOL) tablet 650 mg (has no administration in time range)    Or  acetaminophen (TYLENOL) suppository 650 mg (has no administration in time range)  ondansetron (ZOFRAN) tablet 4 mg (has no administration in time range)    Or  ondansetron (ZOFRAN) injection 4 mg (has no administration in time range)  lactated ringers bolus 1,000 mL (1,000 mLs Intravenous Bolus 05/19/23 1032)  ketorolac (TORADOL) 15 MG/ML injection 15 mg (15 mg Intravenous Given 05/19/23 1032)    Mobility walks     Focused Assessments Cardiac Assessment Handoff:    No results found for: "CKTOTAL", "CKMB", "CKMBINDEX", "TROPONINI" No results found for: "DDIMER" Does the Patient currently have chest pain? No    R Recommendations: See Admitting Provider Note  Report given to:   Additional Notes:  Fluid Restrictions 1200.

## 2023-05-19 NOTE — H&P (Signed)
History and Physical    KODY PURECO UEA:[id number] DOB: 12/09/1955 DOA: 05/19/2023  PCP: Assunta Found, MD   Patient coming from: Home  Chief Complaint: Generalized weakness/mild confusion  HPI: [name] is a 67 y.o. female with medical history significant for hypertension, OA, GERD, MS, diverticulitis, and prior seizure who presented to the ED with complaints of worsening generalized weakness along with dehydration, fatigue, and mild confusion.  She also reports seeing things from the corner of her eyes occasionally.  She was seen in the ED 11/24 with complaints of low back pain for the past 2 weeks after lifting some heavy boxes and had a CT of her lumbar spine without any acute findings.  She was noted to have sodium of 125 at that point and was still sent home with recommendations to remain on some electrolytes and sodium chloride tablets which were not prescribed.  She recently has had a UTI and has completed course of antibiotics as well.  She denies any fevers, chills, headaches, or other focal deficits.  She limits her sodium intake significantly and drinks a significant amount of water each day.   ED Course: Serum sodium noted to be 124 and patient given 1 L of LR in ED as well as some Toradol.  Review of Systems: Reviewed as noted above, otherwise negative.  Past Medical History:  Diagnosis Date   Arthritis    Osteoarthritis   Colon polyps    GERD (gastroesophageal reflux disease)    Hiatal hernia    Hypertension    MS (multiple sclerosis) (HCC) 12/09/2013   Multiple sclerosis (HCC)    Seizures (HCC) 01/04/2005   1 and Only    Past Surgical History:  Procedure Laterality Date   COLONOSCOPY     COLONOSCOPY N/A 09/16/2012   Procedure: COLONOSCOPY;  Surgeon: Malissa Hippo, MD;  Location: [facility];  Service: Endoscopy;  Laterality: N/A;  1030-rescheduled to 9:30 Ann notified pt   COLONOSCOPY N/A 01/05/2020   Procedure: COLONOSCOPY;  Surgeon: Malissa Hippo, MD;   Location: [facility];  Service: Endoscopy;  Laterality: N/A;  730   COLONOSCOPY WITH PROPOFOL N/A 12/23/2022   Procedure: COLONOSCOPY WITH PROPOFOL;  Surgeon: Dolores Frame, MD;  Location: [facility];  Service: Gastroenterology;  Laterality: N/A;  8:30am;ASA 3   DILATION AND CURETTAGE OF UTERUS     ORIF HUMERUS FRACTURE Left 02/16/2017   Procedure: OPEN REDUCTION INTERNAL FIXATION (ORIF) LEFT PROXIMAL HUMERUS FRACTURE;  Surgeon: Cammy Copa, MD;  Location: [facility];  Service: Orthopedics;  Laterality: Left;   POLYPECTOMY  01/05/2020   Procedure: POLYPECTOMY;  Surgeon: Malissa Hippo, MD;  Location: [facility];  Service: Endoscopy;;   POLYPECTOMY  12/23/2022   Procedure: POLYPECTOMY;  Surgeon: Dolores Frame, MD;  Location: [facility];  Service: Gastroenterology;;     reports that she has been smoking cigarettes. She has a 22.5 pack-year smoking history. She has been exposed to tobacco smoke. She has never used smokeless tobacco. She reports current alcohol use of about 8.0 standard drinks of alcohol per week. She reports that she does not use drugs.  Allergies  Allergen Reactions   Sulfa Antibiotics     Unsure    Codeine Nausea And Vomiting   Dilaudid [Hydromorphone Hcl] Nausea And Vomiting    Zofran helped    Family History  Problem Relation Age of Onset   Stomach cancer Father    Sick sinus syndrome Father  Colon cancer Neg Hx     Prior to Admission medications   Medication Sig Start Date End Date Taking? Authorizing Provider  Cholecalciferol 50 MCG (2000 UT) CAPS Take 2,000 Units by mouth daily.    Yes [provider]  Cyanocobalamin (VITAMIN B-12) 3000 MCG SUBL Place 3,000 mcg under the tongue daily.    Yes [provider]  diazepam (VALIUM) 5 MG tablet Take 5 mg by mouth at bedtime as needed for sedation.   Yes [provider]  losartan (COZAAR) 50 MG tablet Take 50 mg by mouth daily.    Yes [provider]  Polyethyl Glycol-Propyl Glycol (SYSTANE OP) Place 1 drop into both eyes as needed (dry eyes).   Yes [provider]  Probiotic Product (PROBIOTIC PO) Take 1 capsule by mouth daily.   Yes [provider]  Vilazodone HCl (VIIBRYD) 10 MG TABS Take 1 tablet (10 mg total) by mouth daily. TAKE 1 TABLET BY MOUTH DAILY OR AS DIRECTED BY DOCTOR Patient taking differently: Take 10 mg by mouth at bedtime. TAKE 1 TABLET BY MOUTH DAILY OR AS DIRECTED BY DOCTOR 10/08/22  Yes Lomax, Amy, NP  vitamin C (ASCORBIC ACID) 250 MG tablet Take 250 mg by mouth daily.   Yes [provider]  XARELTO 20 MG TABS tablet Take 20 mg by mouth daily. 12/02/22  Yes [provider]    Physical Exam: Vitals:   05/19/23 1045 05/19/23 1100 05/19/23 1115 05/19/23 1200  BP: (!) 143/69 128/72  126/67  Pulse: 63 66 67 60  Resp:      SpO2: 99% 100% 97% 97%  Weight:      Height:        Constitutional: NAD, calm, comfortable Vitals:   05/19/23 1045 05/19/23 1100 05/19/23 1115 05/19/23 1200  BP: (!) 143/69 128/72  126/67  Pulse: 63 66 67 60  Resp:      SpO2: 99% 100% 97% 97%  Weight:      Height:       Eyes: lids and conjunctivae normal Neck: normal, supple Respiratory: clear to auscultation bilaterally. Normal respiratory effort. No accessory muscle use.  Cardiovascular: Regular rate and rhythm, no murmurs. Abdomen: no tenderness, no distention. Bowel sounds positive.  Musculoskeletal:  No edema. Skin: no rashes, lesions, ulcers.  Psychiatric: Flat affect  Labs on Admission: I have personally reviewed following labs and imaging studies  CBC: Recent Labs  Lab 05/17/23 1059 05/19/23 1017  WBC 5.6 4.9  NEUTROABS 3.9  --   HGB 14.6 13.9  HCT 43.5 39.9  MCV 101.4* 101.8*  PLT 235 203   Basic Metabolic Panel: Recent Labs  Lab 05/17/23 1059 05/19/23 1017  NA 125* 124*  K 4.4 4.1  CL 93* 92*  CO2 23 24  GLUCOSE 107* 93  BUN 6* 5*  CREATININE 0.77 0.76  CALCIUM 8.9  8.7*   GFR: Estimated Creatinine Clearance: 56 mL/min (by C-G formula based on SCr of 0.76 mg/dL). Liver Function Tests: Recent Labs  Lab 05/19/23 1017  AST 17  ALT 16  ALKPHOS 50  BILITOT 0.6  PROT 6.2*  ALBUMIN 3.9   No results for input(s): "LIPASE", "AMYLASE" in the last 168 hours. No results for input(s): "AMMONIA" in the last 168 hours. Coagulation Profile: No results for input(s): "INR", "PROTIME" in the last 168 hours. Cardiac Enzymes: No results for input(s): "CKTOTAL", "CKMB", "CKMBINDEX", "TROPONINI" in the last 168 hours. BNP (last 3 results) No results for input(s): "PROBNP" in the last  8760 hours. HbA1C: No results for input(s): "HGBA1C" in the last 72 hours. CBG: No results for input(s): "GLUCAP" in the last 168 hours. Lipid Profile: No results for input(s): "CHOL", "HDL", "LDLCALC", "TRIG", "CHOLHDL", "LDLDIRECT" in the last 72 hours. Thyroid Function Tests: Recent Labs    05/19/23 1017  TSH 1.648   Anemia Panel: No results for input(s): "VITAMINB12", "FOLATE", "FERRITIN", "TIBC", "IRON", "RETICCTPCT" in the last 72 hours. Urine analysis:    Component Value Date/Time   COLORURINE COLORLESS (A) 05/19/2023 1036   APPEARANCEUR CLEAR 05/19/2023 1036   LABSPEC 1.001 (L) 05/19/2023 1036   PHURINE 6.0 05/19/2023 1036   GLUCOSEU NEGATIVE 05/19/2023 1036   HGBUR NEGATIVE 05/19/2023 1036   BILIRUBINUR NEGATIVE 05/19/2023 1036   KETONESUR NEGATIVE 05/19/2023 1036   PROTEINUR NEGATIVE 05/19/2023 1036   NITRITE NEGATIVE 05/19/2023 1036   LEUKOCYTESUR NEGATIVE 05/19/2023 1036    Radiological Exams on Admission: No results found.   Assessment/Plan Principal Problem:   Hyponatremia Active Problems:   MS (multiple sclerosis) (HCC)   Pancreatic lesion   Diverticulitis of colon    Symptomatic euvolemic hyponatremia -Likely related to low solute intake as well as noted polydipsia -Questionable whether acute or chronic, but symptoms are more acute and  therefore may have developed over the last few weeks -Unfortunately LR given in ED -Monitor sodium levels closely with fluid restriction and sodium chloride tablets -Avoid further IV fluid at this time unless sodium levels began to drop -Monitor osmolality and TSH 1.648  Prior right lower extremity DVT -Diagnosed 07/2022 -Continue Xarelto -Can likely come off of this medication as it has been over 6 months, follow-up with PCP  Hypertension -Continue losartan  Seizures -Only seems to be a one-time episode and therefore not on anything for this  MS -Continue home medications  GERD -Does not appear to be on any medication for this  OA -Continue home medications   DVT prophylaxis: Xarelto Code Status: Full Family Communication: Mother at bedside Disposition Plan:Admit for hyponatremia Consults called:None Admission status: Inpatient, MedSurg  Severity of Illness: The appropriate patient status for this patient is INPATIENT. Inpatient status is judged to be reasonable and necessary in order to provide the required intensity of service to ensure the patient's safety. The patient's presenting symptoms, physical exam findings, and initial radiographic and laboratory data in the context of their chronic comorbidities is felt to place them at high risk for further clinical deterioration. Furthermore, it is not anticipated that the patient will be medically stable for discharge from the hospital within 2 midnights of admission.   * I certify that at the point of admission it is my clinical judgment that the patient will require inpatient hospital care spanning beyond 2 midnights from the point of admission due to high intensity of service, high risk for further deterioration and high frequency of surveillance required.*   Janaya Broy D Aniesa Boback DO [facility]  If 7PM-7AM, please contact night-coverage [email or web address]  05/19/2023, 12:56 PM

## 2023-05-19 NOTE — ED Notes (Signed)
Cup of water given to Patient.

## 2023-05-19 NOTE — ED Provider Notes (Signed)
[facility] EMERGENCY DEPARTMENT AT [facility] Provider Note   CSN: [id number] Arrival date & time: 05/19/23  4098     History  Chief Complaint  Patient presents with   Dehydration    [name] is a 67 y.o. female.  HPI   67 year old female presents emergency department with complaints of dehydration.  Patient states for the past few days, has felt like she has been dehydrated.  Reports feelings of generalized fatigue as well as describes symptoms of seeing something out of the corner of her eye when it is not actually there when she focuses her vision.  States that this has happened before when her sodium was low.  Reports being seen on Sunday when she was experiencing similar symptoms and her sodium was 125.  Was given a liter of fluid at that time and noted improvement of symptoms before returning the next day.  Reports oral intake of Pedialyte as well as Gatorade drinking 4-5 bottles of some electrolyte rich drink as well as reportedly drinking about a gallon of water daily.  States that she had a few episodes of loose bowel movement overnight last night that she thinks is related to the canned tuna she had at dinner prior to symptom onset.  Denies any fever, chills, cough, congestion, abdominal pain, urinary symptoms, hematochezia/melena.  Patient also states that she is currently being treated for UTI with a couple days left of her at home Bactrim.  Also reports bitemporal headache that occurred this morning.  Has taken nothing for headache.  States it feels similar to other migraine she has had in the past.  Denies any visual symptoms, gait abnormality, slurred speech, facial droop, weakness as history deficits in upper lower extremities.  States that she drinks alcohol every other day about 4 drinks or so but has not had any alcohol for the past 2 weeks as she has been treated for this urinary tract infection.  Past medical history significant for MS, seizures, GERD,  hypertension  Home Medications Prior to Admission medications   Medication Sig Start Date End Date Taking? Authorizing Provider  Cholecalciferol 50 MCG (2000 UT) CAPS Take 2,000 Units by mouth daily.    Yes [provider]  Cyanocobalamin (VITAMIN B-12) 3000 MCG SUBL Place 3,000 mcg under the tongue daily.    Yes [provider]  diazepam (VALIUM) 5 MG tablet Take 5 mg by mouth at bedtime as needed for sedation.   Yes [provider]  losartan (COZAAR) 50 MG tablet Take 50 mg by mouth daily.    Yes [provider]  Polyethyl Glycol-Propyl Glycol (SYSTANE OP) Place 1 drop into both eyes as needed (dry eyes).   Yes [provider]  Probiotic Product (PROBIOTIC PO) Take 1 capsule by mouth daily.   Yes [provider]  Vilazodone HCl (VIIBRYD) 10 MG TABS Take 1 tablet (10 mg total) by mouth daily. TAKE 1 TABLET BY MOUTH DAILY OR AS DIRECTED BY DOCTOR Patient taking differently: Take 10 mg by mouth at bedtime. TAKE 1 TABLET BY MOUTH DAILY OR AS DIRECTED BY DOCTOR 10/08/22  Yes Lomax, Amy, NP  vitamin C (ASCORBIC ACID) 250 MG tablet Take 250 mg by mouth daily.   Yes [provider]  XARELTO 20 MG TABS tablet Take 20 mg by mouth daily. 12/02/22  Yes [provider]      Allergies    Sulfa antibiotics, Codeine, and Dilaudid [hydromorphone hcl]    Review of Systems  Review of Systems  All other systems reviewed and are negative.   Physical Exam Updated Vital Signs BP 134/89 (BP Location: Left Arm)   Pulse 62   Temp 97.7 F (36.5 C) (Oral)   Resp 16   Ht 5\' 4"  (1.626 m)   Wt 51.3 kg   SpO2 100%   BMI 19.40 kg/m  Physical Exam Vitals and nursing note reviewed.  Constitutional:      General: She is not in acute distress.    Appearance: She is well-developed.  HENT:     Head: Normocephalic and atraumatic.  Eyes:     Conjunctiva/sclera: Conjunctivae normal.  Cardiovascular:     Rate and Rhythm: Normal rate and  regular rhythm.  Pulmonary:     Effort: Pulmonary effort is normal. No respiratory distress.     Breath sounds: Normal breath sounds.  Abdominal:     Palpations: Abdomen is soft.     Tenderness: There is no abdominal tenderness.  Musculoskeletal:        General: No swelling.     Cervical back: Neck supple.  Skin:    General: Skin is warm and dry.     Capillary Refill: Capillary refill takes less than 2 seconds.  Neurological:     Mental Status: She is alert.     Comments: Alert and oriented to self, place, time and event.   Speech is fluent, clear without dysarthria or dysphasia.   Strength symmtric in upper/lower extremities   Sensation intact in upper/lower extremities   Normal gait.  CN I not tested  CN II not tested CN III, IV, VI PERRLA and EOMs intact bilaterally  CN V Intact sensation to sharp and light touch to the face  CN VII facial movements symmetric  CN VIII not tested  CN IX, X no uvula deviation, symmetric rise of soft palate  CN XI symmetric SCM and trapezius strength bilaterally  CN XII Midline tongue protrusion, symmetric L/R movements     Psychiatric:        Mood and Affect: Mood normal.     ED Results / Procedures / Treatments   Labs (all labs ordered are listed, but only abnormal results are displayed) Labs Reviewed  COMPREHENSIVE METABOLIC PANEL - Abnormal; Notable for the following components:      Result Value   Sodium 124 (*)    Chloride 92 (*)    BUN 5 (*)    Calcium 8.7 (*)    Total Protein 6.2 (*)    All other components within normal limits  CBC - Abnormal; Notable for the following components:   MCV 101.8 (*)    MCH 35.5 (*)    All other components within normal limits  URINALYSIS, ROUTINE W REFLEX MICROSCOPIC - Abnormal; Notable for the following components:   Color, Urine COLORLESS (*)    Specific Gravity, Urine 1.001 (*)    All other components within normal limits  RESP PANEL BY RT-PCR (RSV, FLU A&B, COVID)  RVPGX2  NA AND  K (SODIUM & POTASSIUM), RAND UR  TSH  SODIUM, URINE, RANDOM  OSMOLALITY  OSMOLALITY, URINE  HIV ANTIBODY (ROUTINE TESTING W REFLEX)  MAGNESIUM  BASIC METABOLIC PANEL    EKG None  Radiology No results found.  Procedures Procedures    Medications Ordered in ED Medications  sodium chloride tablet 1 g (1 g Oral Given 05/19/23 1726)  losartan (COZAAR) tablet 50 mg (has no administration in time range)  diazepam (VALIUM) tablet 5 mg (has no  administration in time range)  Vilazodone HCl TABS 10 mg (has no administration in time range)  rivaroxaban (XARELTO) tablet 20 mg (has no administration in time range)  acetaminophen (TYLENOL) tablet 650 mg (has no administration in time range)    Or  acetaminophen (TYLENOL) suppository 650 mg (has no administration in time range)  ondansetron (ZOFRAN) tablet 4 mg (has no administration in time range)    Or  ondansetron (ZOFRAN) injection 4 mg (has no administration in time range)  lactated ringers bolus 1,000 mL (1,000 mLs Intravenous Bolus 05/19/23 1032)  ketorolac (TORADOL) 15 MG/ML injection 15 mg (15 mg Intravenous Given 05/19/23 1032)    ED Course/ Medical Decision Making/ A&P Clinical Course as of 05/19/23 1733  Tue May 19, 2023  1239 Consulted hospitalist Dr. Sherryll Burger who agreed with admission and assume further treatment/care. [CR]    Clinical Course User Index [CR] Peter Garter, PA                                 Medical Decision Making Amount and/or Complexity of Data Reviewed Labs: ordered.  Risk Prescription drug management. Decision regarding hospitalization.   This patient presents to the ED for concern of dehydration, this involves an extensive number of treatment options, and is a complaint that carries with it a high risk of complications and morbidity.  The differential diagnosis includes electrolyte derangement, infectious etiology, CVA, medication side effect, other   Co morbidities that complicate the  patient evaluation  See HPI   Additional history obtained:  Additional history obtained from EMR External records from outside source obtained and reviewed including hospital records   Lab Tests:  I Ordered, and personally interpreted labs.  The pertinent results include: Hyponatremia of 124.  No transaminitis.  No renal dysfunction.  No leukocytosis.  No evidence of anemia.  Placed within range.  UA without abnormality.  Respiratory viral panel negative.   Imaging Studies ordered:  N/a   Cardiac Monitoring: / EKG:  The patient was maintained on a cardiac monitor.  I personally viewed and interpreted the cardiac monitored which showed an underlying rhythm of: Sinus rhythm   Consultations Obtained:  See ED course  Problem List / ED Course / Critical interventions / Medication management  Hyponatremia I ordered medication including Toradol, lactated Ringer's   Reevaluation of the patient after these medicines showed that the patient improved I have reviewed the patients home medicines and have made adjustments as needed   Social Determinants of Health:  Denies tobacco, licit drug use   Test / Admission - Considered:  Hyponatremia Vitals signs within normal range and stable throughout visit. Laboratory studies significant for: See above 67 year old female presents emergency department with complaints of dehydration.  Reports seeing things out of the corner of her eyes as well as feeling generalized fatigue.  Reports history of hyponatremia and states this feels similar.  Did report a few episodes of loose bowel movements overnight last night.  Was seen in the ER on Sunday with sodium of 125 at that time.  On exam, patient not actively hallucinating.  Labs concerning for hyponatremia of 124.  Suspect this could be secondary to gross amounts of water intake over the past several days due to feelings of dehydration.  Lower suspicion for GI etiology given patient with  hyponatremia 2 days ago before a few episodes of loose bowel movement earlier today.  Patient treated with IV fluids  while in the ED.  Will pursue admission.        Final Clinical Impression(s) / ED Diagnoses Final diagnoses:  Hyponatremia    Rx / DC Orders ED Discharge Orders     None         Peter Garter, [place] 05/19/23 1733    Sloan Leiter, DO 05/24/23 1009

## 2023-05-20 DIAGNOSIS — E871 Hypo-osmolality and hyponatremia: Secondary | ICD-10-CM | POA: Diagnosis not present

## 2023-05-20 LAB — HIV ANTIBODY (ROUTINE TESTING W REFLEX): HIV Screen 4th Generation wRfx: NONREACTIVE

## 2023-05-20 LAB — BASIC METABOLIC PANEL
Anion gap: 7 (ref 5–15)
BUN: 10 mg/dL (ref 8–23)
CO2: 23 mmol/L (ref 22–32)
Calcium: 8.6 mg/dL — ABNORMAL LOW (ref 8.9–10.3)
Chloride: 97 mmol/L — ABNORMAL LOW (ref 98–111)
Creatinine, Ser: 0.72 mg/dL (ref 0.44–1.00)
GFR, Estimated: >60 mL/min (ref >60)
Glucose, Bld: 74 mg/dL (ref 70–99)
Potassium: 4.3 mmol/L (ref 3.5–5.1)
Sodium: 127 mmol/L — ABNORMAL LOW (ref 135–145)

## 2023-05-20 LAB — MAGNESIUM: Magnesium: 1.8 mg/dL (ref 1.7–2.4)

## 2023-05-20 MED ORDER — RIVAROXABAN 20 MG PO TABS
20.0000 mg | ORAL_TABLET | Freq: Every day | ORAL | Status: DC
  Administered 2023-05-20: 20 mg via ORAL
  Filled 2023-05-20: qty 1

## 2023-05-20 MED ORDER — SODIUM CHLORIDE 1 G PO TABS: 1.0000 g | ORAL_TABLET | Freq: Two times a day (BID) | ORAL | 0 refills | Status: AC

## 2023-05-20 NOTE — Care Management CC44 (Signed)
Condition Code 44 Documentation Completed  Patient Details  Name: MEYA TROSCLAIR MRN: [id number] Date of Birth: 03/04/1956   Condition Code 44 given:  Yes Patient signature on Condition Code 44 notice:  Yes Documentation of 2 MD's agreement:  Yes Code 44 added to claim:  Yes    Isabella Bowens, LCSWA 05/20/2023, 9:38 AM

## 2023-05-20 NOTE — Care Management Important Message (Signed)
Important Message  Patient Details  Name: [name] MRN: [id number] Date of Birth: Oct 29, 1955   Important Message Given:  N/A - LOS <3 / Initial given by admissions     Corey Harold 05/20/2023, 10:20 AM

## 2023-05-20 NOTE — Plan of Care (Signed)

## 2023-05-20 NOTE — Discharge Summary (Signed)
Physician Discharge Summary  [name] RXV:[id number] DOB: 03-Feb-1956 DOA: 05/19/2023  PCP: Assunta Found, MD  Admit date: 05/19/2023  Discharge date: 05/20/2023  Admitted From:Home  Disposition:  Home  Recommendations for Outpatient Follow-up:  Follow up with PCP on 12/10 as scheduled and recheck BMP at that time Continue on 1500 mL fluid restriction and continue sodium chloride tablets as prescribed for 1 more week Encouraged increasing sodium intake on a daily basis as patient has a bland diet and discouraged drinking beer as well as drinking high amounts of fluid Continue home medications as prior  Home Health: None  Equipment/Devices: None  Discharge Condition:Stable  CODE STATUS: Full  Diet recommendation: Regular diet  Brief/Interim Summary: [name] is a 67 y.o. female with medical history significant for hypertension, OA, GERD, MS, diverticulitis, and prior seizure who presented to the ED with complaints of worsening generalized weakness along with dehydration, fatigue, and mild confusion.  She also reports seeing things from the corner of her eyes occasionally.  She was seen in the ED 11/24 with complaints of low back pain for the past 2 weeks after lifting some heavy boxes and had a CT of her lumbar spine without any acute findings.  She was admitted with symptomatic euvolemic hyponatremia and was noted to be hypotonic on her serum osmolality.  Her urine osmolality was quite low and therefore she did not have any physiological disturbances.  It appears that she has a component of primary polydipsia as well as some beer Poto mania contributing to her hyponatremia along with low solute intake.  She now has increasing sodium levels and is overall starting to feel much better.  I have encouraged her to remain off of beer and restrict her fluid intake and remain on sodium chloride tablets as prescribed and follow-up with her primary care physician for lab work in the next 1  week.  No other acute events or concerns noted throughout the course of this admission.  Discharge Diagnoses:  Principal Problem:   Hyponatremia Active Problems:   MS (multiple sclerosis) (HCC)   Pancreatic lesion   Diverticulitis of colon  Principal discharge diagnosis: Symptomatic hyponatremia in the setting of polydipsia and low solute intake as well as some beer Poto mania.  Discharge Instructions  Discharge Instructions     Increase activity slowly   Complete by: As directed       Allergies as of 05/20/2023       Reactions   Sulfa Antibiotics    Unsure    Codeine Nausea And Vomiting   Dilaudid [hydromorphone Hcl] Nausea And Vomiting   Zofran helped        Medication List     TAKE these medications    Cholecalciferol 50 MCG (2000 UT) Caps Take 2,000 Units by mouth daily.   diazepam 5 MG tablet Commonly known as: VALIUM Take 5 mg by mouth at bedtime as needed for sedation.   losartan 50 MG tablet Commonly known as: COZAAR Take 50 mg by mouth daily.   PROBIOTIC PO Take 1 capsule by mouth daily.   sodium chloride 1 g tablet Take 1 tablet (1 g total) by mouth 2 (two) times daily with a meal for 7 days.   SYSTANE OP Place 1 drop into both eyes as needed (dry eyes).   Vilazodone HCl 10 MG Tabs Commonly known as: Viibryd Take 1 tablet (10 mg total) by mouth daily. TAKE 1 TABLET BY MOUTH DAILY OR AS DIRECTED BY DOCTOR What changed:  when to take this   Vitamin B-12 3000 MCG Subl Place 3,000 mcg under the tongue daily.   vitamin C 250 MG tablet Commonly known as: ASCORBIC ACID Take 250 mg by mouth daily.   Xarelto 20 MG Tabs tablet Generic drug: rivaroxaban Take 20 mg by mouth daily.        Follow-up Information     Assunta Found, MD. Go on 06/02/2023.   Specialty: Family Medicine Contact information: [address] [place] [address] [phone]                Allergies  Allergen Reactions   Sulfa Antibiotics      Unsure    Codeine Nausea And Vomiting   Dilaudid [Hydromorphone Hcl] Nausea And Vomiting    Zofran helped    Consultations: None   Procedures/Studies: CT Lumbar Spine Wo Contrast  Result Date: 05/17/2023 CLINICAL DATA:  67 year old female taking antibiotics for UTI. Heavy lifting several weeks ago, low back pain. EXAM: CT LUMBAR SPINE WITHOUT CONTRAST TECHNIQUE: Multidetector CT imaging of the lumbar spine was performed without intravenous contrast administration. Multiplanar CT image reconstructions were also generated. RADIATION DOSE REDUCTION: This exam was performed according to the departmental dose-optimization program which includes automated exposure control, adjustment of the mA and/or kV according to patient size and/or use of iterative reconstruction technique. COMPARISON:  CT Abdomen and Pelvis 10/28/2022. FINDINGS: Segmentation: Normal. Alignment: Stable lumbar lordosis since May. Subtle grade 1 anterolisthesis of L3 on L4. No scoliosis. Vertebrae: Chronic osteopenia. Maintained vertebral height. Small chronic avulsion fracture of the right L2 transverse process is stable on series 3, image 42. No acute osseous abnormality identified. Visible sacrum and SI joints appear intact. Paraspinal and other soft tissues: Aortoiliac calcified atherosclerosis. Normal caliber abdominal aorta. Large bowel retained stool and extensive distal large bowel diverticulosis at the pelvis. Lumbar paraspinal soft tissues appear negative. Disc levels: T11-T12: Vacuum disc.  But no evidence of stenosis. T12-L1: Vacuum disc. Mild circumferential disc bulge. No convincing stenosis. L1-L2:  Negative. L2-L3: Mild to moderate circumferential disc bulge. Mild to moderate facet hypertrophy. Mild spinal, lateral recess, and foraminal stenosis. L3-L4: Subtle anterolisthesis. Circumferential disc bulge. Mild to moderate ligament flavum and moderate to severe facet hypertrophy. Up to moderate spinal stenosis (series 4,  image 67) with symmetric lateral recess stenosis. Mild to moderate bilateral L3 foraminal stenosis. L4-L5: Circumferential disc bulge asymmetric to the right. Moderate to severe facet and ligament flavum hypertrophy. Moderate spinal stenosis and lateral recess stenosis suspected (series 4, image 83). Mild left and mild to moderate right L4 foraminal stenosis. L5-S1: Less pronounced circumferential disc bulge. Moderate facet hypertrophy. No convincing stenosis. IMPRESSION: 1. No acute osseous abnormality in the Lumbar Spine. Small chronic right L2 transverse process fracture. 2. Subtle anterolisthesis of L3 on L4. Multilevel disc and facet degeneration. Up to moderate multilevel spinal stenosis, likely most pronounced at L4-L5. Associated lateral recess and foraminal stenosis at the affected levels. 3. Aortic Atherosclerosis (ICD10-I70.0). Large bowel diverticulosis. Electronically Signed   By: Odessa Fleming M.D.   On: 05/17/2023 12:11     Discharge Exam: Vitals:   05/20/23 0150 05/20/23 0451  BP: 121/70 118/66  Pulse: 60 (!) 56  Resp: 20 18  Temp: 98 F (36.7 C) 98.5 F (36.9 C)  SpO2: 99% 97%   Vitals:   05/19/23 1424 05/19/23 2205 05/20/23 0150 05/20/23 0451  BP: 134/89 127/78 121/70 118/66  Pulse: 62 (!) 57 60 (!) 56  Resp:  20 20 18   Temp: 97.7  F (36.5 C) 97.7 F (36.5 C) 98 F (36.7 C) 98.5 F (36.9 C)  TempSrc: Oral Oral  Oral  SpO2: 100% 100% 99% 97%  Weight:      Height:        General: Pt is alert, awake, not in acute distress Cardiovascular: RRR, S1/S2 +, no rubs, no gallops Respiratory: CTA bilaterally, no wheezing, no rhonchi Abdominal: Soft, NT, ND, bowel sounds + Extremities: no edema, no cyanosis    The results of significant diagnostics from this hospitalization (including imaging, microbiology, ancillary and laboratory) are listed below for reference.     Microbiology: Recent Results (from the past 240 hour(s))  Resp panel by RT-PCR (RSV, Flu A&B, Covid)  Anterior Nasal Swab     Status: None   Collection Time: 05/19/23 10:14 AM   Specimen: Anterior Nasal Swab  Result Value Ref Range Status   SARS Coronavirus 2 by RT PCR NEGATIVE NEGATIVE Final    Comment: (NOTE) SARS-CoV-2 target nucleic acids are NOT DETECTED.  The SARS-CoV-2 RNA is generally detectable in upper respiratory specimens during the acute phase of infection. The lowest concentration of SARS-CoV-2 viral copies this assay can detect is 138 copies/mL. A negative result does not preclude SARS-Cov-2 infection and should not be used as the sole basis for treatment or other patient management decisions. A negative result may occur with  improper specimen collection/handling, submission of specimen other than nasopharyngeal swab, presence of viral mutation(s) within the areas targeted by this assay, and inadequate number of viral copies(<138 copies/mL). A negative result must be combined with clinical observations, patient history, and epidemiological information. The expected result is Negative.  Fact Sheet for Patients:  BloggerCourse.com  Fact Sheet for Healthcare Providers:  SeriousBroker.it  This test is no t yet approved or cleared by the [place] FDA and  has been authorized for detection and/or diagnosis of SARS-CoV-2 by FDA under an Emergency Use Authorization (EUA). This EUA will remain  in effect (meaning this test can be used) for the duration of the COVID-19 declaration under Section 564(b)(1) of the Act, 21 U.S.C.section 360bbb-3(b)(1), unless the authorization is terminated  or revoked sooner.       Influenza A by PCR NEGATIVE NEGATIVE Final   Influenza B by PCR NEGATIVE NEGATIVE Final    Comment: (NOTE) The Xpert Xpress SARS-CoV-2/FLU/RSV plus assay is intended as an aid in the diagnosis of influenza from Nasopharyngeal swab specimens and should not be used as a sole basis for treatment. Nasal washings  and aspirates are unacceptable for Xpert Xpress SARS-CoV-2/FLU/RSV testing.  Fact Sheet for Patients: BloggerCourse.com  Fact Sheet for Healthcare Providers: SeriousBroker.it  This test is not yet approved or cleared by the [place] FDA and has been authorized for detection and/or diagnosis of SARS-CoV-2 by FDA under an Emergency Use Authorization (EUA). This EUA will remain in effect (meaning this test can be used) for the duration of the COVID-19 declaration under Section 564(b)(1) of the Act, 21 U.S.C. section 360bbb-3(b)(1), unless the authorization is terminated or revoked.     Resp Syncytial Virus by PCR NEGATIVE NEGATIVE Final    Comment: (NOTE) Fact Sheet for Patients: BloggerCourse.com  Fact Sheet for Healthcare Providers: SeriousBroker.it  This test is not yet approved or cleared by the [place] FDA and has been authorized for detection and/or diagnosis of SARS-CoV-2 by FDA under an Emergency Use Authorization (EUA). This EUA will remain in effect (meaning this test can be used) for the duration of the COVID-19 declaration  under Section 564(b)(1) of the Act, 21 U.S.C. section 360bbb-3(b)(1), unless the authorization is terminated or revoked.  Performed at [facility], [address] [place], [place] [address]      Labs: BNP (last 3 results) No results for input(s): "BNP" in the last 8760 hours. Basic Metabolic Panel: Recent Labs  Lab 05/17/23 1059 05/19/23 1017 05/20/23 0420  NA 125* 124* 127*  K 4.4 4.1 4.3  CL 93* 92* 97*  CO2 23 24 23   GLUCOSE 107* 93 74  BUN 6* 5* 10  CREATININE 0.77 0.76 0.72  CALCIUM 8.9 8.7* 8.6*  MG  --   --  1.8   Liver Function Tests: Recent Labs  Lab 05/19/23 1017  AST 17  ALT 16  ALKPHOS 50  BILITOT 0.6  PROT 6.2*  ALBUMIN 3.9   No results for input(s): "LIPASE", "AMYLASE" in the last 168 hours. No  results for input(s): "AMMONIA" in the last 168 hours. CBC: Recent Labs  Lab 05/17/23 1059 05/19/23 1017  WBC 5.6 4.9  NEUTROABS 3.9  --   HGB 14.6 13.9  HCT 43.5 39.9  MCV 101.4* 101.8*  PLT 235 203   Cardiac Enzymes: No results for input(s): "CKTOTAL", "CKMB", "CKMBINDEX", "TROPONINI" in the last 168 hours. BNP: Invalid input(s): "POCBNP" CBG: No results for input(s): "GLUCAP" in the last 168 hours. D-Dimer No results for input(s): "DDIMER" in the last 72 hours. Hgb A1c No results for input(s): "HGBA1C" in the last 72 hours. Lipid Profile No results for input(s): "CHOL", "HDL", "LDLCALC", "TRIG", "CHOLHDL", "LDLDIRECT" in the last 72 hours. Thyroid function studies Recent Labs    05/19/23 1017  TSH 1.648   Anemia work up No results for input(s): "VITAMINB12", "FOLATE", "FERRITIN", "TIBC", "IRON", "RETICCTPCT" in the last 72 hours. Urinalysis    Component Value Date/Time   COLORURINE COLORLESS (A) 05/19/2023 1036   APPEARANCEUR CLEAR 05/19/2023 1036   LABSPEC 1.001 (L) 05/19/2023 1036   PHURINE 6.0 05/19/2023 1036   GLUCOSEU NEGATIVE 05/19/2023 1036   HGBUR NEGATIVE 05/19/2023 1036   BILIRUBINUR NEGATIVE 05/19/2023 1036   KETONESUR NEGATIVE 05/19/2023 1036   PROTEINUR NEGATIVE 05/19/2023 1036   NITRITE NEGATIVE 05/19/2023 1036   LEUKOCYTESUR NEGATIVE 05/19/2023 1036   Sepsis Labs Recent Labs  Lab 05/17/23 1059 05/19/23 1017  WBC 5.6 4.9   Microbiology Recent Results (from the past 240 hour(s))  Resp panel by RT-PCR (RSV, Flu A&B, Covid) Anterior Nasal Swab     Status: None   Collection Time: 05/19/23 10:14 AM   Specimen: Anterior Nasal Swab  Result Value Ref Range Status   SARS Coronavirus 2 by RT PCR NEGATIVE NEGATIVE Final    Comment: (NOTE) SARS-CoV-2 target nucleic acids are NOT DETECTED.  The SARS-CoV-2 RNA is generally detectable in upper respiratory specimens during the acute phase of infection. The lowest concentration of SARS-CoV-2 viral  copies this assay can detect is 138 copies/mL. A negative result does not preclude SARS-Cov-2 infection and should not be used as the sole basis for treatment or other patient management decisions. A negative result may occur with  improper specimen collection/handling, submission of specimen other than nasopharyngeal swab, presence of viral mutation(s) within the areas targeted by this assay, and inadequate number of viral copies(<138 copies/mL). A negative result must be combined with clinical observations, patient history, and epidemiological information. The expected result is Negative.  Fact Sheet for Patients:  BloggerCourse.com  Fact Sheet for Healthcare Providers:  SeriousBroker.it  This test is no t yet approved or cleared  by the [place] and  has been authorized for detection and/or diagnosis of SARS-CoV-2 by FDA under an Emergency Use Authorization (EUA). This EUA will remain  in effect (meaning this test can be used) for the duration of the COVID-19 declaration under Section 564(b)(1) of the Act, 21 U.S.C.section 360bbb-3(b)(1), unless the authorization is terminated  or revoked sooner.       Influenza A by PCR NEGATIVE NEGATIVE Final   Influenza B by PCR NEGATIVE NEGATIVE Final    Comment: (NOTE) The Xpert Xpress SARS-CoV-2/FLU/RSV plus assay is intended as an aid in the diagnosis of influenza from Nasopharyngeal swab specimens and should not be used as a sole basis for treatment. Nasal washings and aspirates are unacceptable for Xpert Xpress SARS-CoV-2/FLU/RSV testing.  Fact Sheet for Patients: BloggerCourse.com  Fact Sheet for Healthcare Providers: SeriousBroker.it  This test is not yet approved or cleared by the [place] FDA and has been authorized for detection and/or diagnosis of SARS-CoV-2 by FDA under an Emergency Use Authorization (EUA). This  EUA will remain in effect (meaning this test can be used) for the duration of the COVID-19 declaration under Section 564(b)(1) of the Act, 21 U.S.C. section 360bbb-3(b)(1), unless the authorization is terminated or revoked.     Resp Syncytial Virus by PCR NEGATIVE NEGATIVE Final    Comment: (NOTE) Fact Sheet for Patients: BloggerCourse.com  Fact Sheet for Healthcare Providers: SeriousBroker.it  This test is not yet approved or cleared by the [place] FDA and has been authorized for detection and/or diagnosis of SARS-CoV-2 by FDA under an Emergency Use Authorization (EUA). This EUA will remain in effect (meaning this test can be used) for the duration of the COVID-19 declaration under Section 564(b)(1) of the Act, 21 U.S.C. section 360bbb-3(b)(1), unless the authorization is terminated or revoked.  Performed at [facility], [address] [place], [place] [address]      Time coordinating discharge: 35 minutes  SIGNED:   Erick Blinks, DO [facility] 05/20/2023, 9:11 AM  If 7PM-7AM, please contact night-coverage [email or web address]

## 2023-05-20 NOTE — Care Management Obs Status (Signed)
MEDICARE OBSERVATION STATUS NOTIFICATION   Patient Details  Name: [name] MRN: [id number] Date of Birth: 10-Dec-1955   Medicare Observation Status Notification Given:  Yes    Beather Arbour 05/20/2023, 9:38 AM

## 2023-06-02 DIAGNOSIS — G35 Multiple sclerosis: Secondary | ICD-10-CM | POA: Diagnosis not present

## 2023-06-02 DIAGNOSIS — E559 Vitamin D deficiency, unspecified: Secondary | ICD-10-CM | POA: Diagnosis not present

## 2023-06-02 DIAGNOSIS — E782 Mixed hyperlipidemia: Secondary | ICD-10-CM | POA: Diagnosis not present

## 2023-06-02 DIAGNOSIS — E871 Hypo-osmolality and hyponatremia: Secondary | ICD-10-CM | POA: Diagnosis not present

## 2023-06-02 DIAGNOSIS — N390 Urinary tract infection, site not specified: Secondary | ICD-10-CM | POA: Diagnosis not present

## 2023-06-02 DIAGNOSIS — E538 Deficiency of other specified B group vitamins: Secondary | ICD-10-CM | POA: Diagnosis not present

## 2023-06-02 DIAGNOSIS — Z681 Body mass index (BMI) 19 or less, adult: Secondary | ICD-10-CM | POA: Diagnosis not present

## 2023-06-02 DIAGNOSIS — Z0001 Encounter for general adult medical examination with abnormal findings: Secondary | ICD-10-CM | POA: Diagnosis not present

## 2023-06-02 DIAGNOSIS — I10 Essential (primary) hypertension: Secondary | ICD-10-CM | POA: Diagnosis not present

## 2023-08-10 ENCOUNTER — Ambulatory Visit
Admission: EM | Admit: 2023-08-10 | Discharge: 2023-08-10 | Disposition: A | Discharge status: Home / Self Care | Payer: Medicare PPO | Attending: Family Medicine | Admitting: Family Medicine

## 2023-08-10 DIAGNOSIS — J069 Acute upper respiratory infection, unspecified: Secondary | ICD-10-CM | POA: Insufficient documentation

## 2023-08-10 LAB — POCT RAPID STREP A (OFFICE): Rapid Strep A Screen: NEGATIVE

## 2023-08-10 LAB — POC COVID19/FLU A&B COMBO
Covid Antigen, POC: NEGATIVE
Influenza A Antigen, POC: NEGATIVE
Influenza B Antigen, POC: NEGATIVE

## 2023-08-10 MED ORDER — LIDOCAINE VISCOUS HCL 2 % MT SOLN: 10.0000 mL | OROMUCOSAL | 0 refills | Status: DC | PRN

## 2023-08-10 NOTE — ED Triage Notes (Signed)
 Pt reports a sore throat, swollen jaws, x 3 days. Has tried salt water gargles pineapple juice and theraflu

## 2023-08-10 NOTE — ED Provider Notes (Signed)
 [facility]    CSN: [id number] Arrival date & time: 08/10/23  1307      History   Chief Complaint No chief complaint on file.   HPI [name] is a 68 y.o. female.   Presenting today with 3-day history of sore throat, feeling like the inside of her mouth is swollen, swollen lymph nodes in the neck, cough, congestion.  Denies fever, chest pain, shortness of breath, abdominal pain, nausea vomiting or diarrhea.  Taking over-the-counter cold and congestion medication with mild temporary benefit.    Past Medical History:  Diagnosis Date   Arthritis    Osteoarthritis   Colon polyps    GERD (gastroesophageal reflux disease)    Hiatal hernia    Hypertension    MS (multiple sclerosis) (HCC) 12/09/2013   Multiple sclerosis (HCC)    Seizures (HCC) 01/04/2005   1 and Only    Patient Active Problem List   Diagnosis Date Noted   Hyponatremia 05/19/2023   Left lower quadrant abdominal pain 10/28/2022   Diverticulitis of colon 10/28/2022   Pancreatic lesion 10/16/2022   Medication management 10/10/2021   Medication care plan discussed with patient 10/10/2021   Multiple sclerosis, secondary progressive (HCC) 10/10/2021   Depression 03/12/2021   Proximal humerus fracture 02/16/2017   Insomnia, controlled 01/06/2017   MS (multiple sclerosis) (HCC) 12/09/2013    Past Surgical History:  Procedure Laterality Date   COLONOSCOPY     COLONOSCOPY N/A 09/16/2012   Procedure: COLONOSCOPY;  Surgeon: Malissa Hippo, MD;  Location: [facility];  Service: Endoscopy;  Laterality: N/A;  1030-rescheduled to 9:30 Ann notified pt   COLONOSCOPY N/A 01/05/2020   Procedure: COLONOSCOPY;  Surgeon: Malissa Hippo, MD;  Location: [facility];  Service: Endoscopy;  Laterality: N/A;  730   COLONOSCOPY WITH PROPOFOL N/A 12/23/2022   Procedure: COLONOSCOPY WITH PROPOFOL;  Surgeon: Dolores Frame, MD;  Location: [facility];  Service: Gastroenterology;  Laterality: N/A;   8:30am;ASA 3   DILATION AND CURETTAGE OF UTERUS     ORIF HUMERUS FRACTURE Left 02/16/2017   Procedure: OPEN REDUCTION INTERNAL FIXATION (ORIF) LEFT PROXIMAL HUMERUS FRACTURE;  Surgeon: Cammy Copa, MD;  Location: [facility];  Service: Orthopedics;  Laterality: Left;   POLYPECTOMY  01/05/2020   Procedure: POLYPECTOMY;  Surgeon: Malissa Hippo, MD;  Location: [facility];  Service: Endoscopy;;   POLYPECTOMY  12/23/2022   Procedure: POLYPECTOMY;  Surgeon: Dolores Frame, MD;  Location: [facility];  Service: Gastroenterology;;    OB History   No obstetric history on file.      Home Medications    Prior to Admission medications   Medication Sig Start Date End Date Taking? Authorizing Provider  lidocaine (XYLOCAINE) 2 % solution Use as directed 10 mLs in the mouth or throat every 3 (three) hours as needed. 08/10/23  Yes Particia Nearing, PA-C  Cholecalciferol 50 MCG (2000 UT) CAPS Take 2,000 Units by mouth daily.     [provider]  Cyanocobalamin (VITAMIN B-12) 3000 MCG SUBL Place 3,000 mcg under the tongue daily.     [provider]  diazepam (VALIUM) 5 MG tablet Take 5 mg by mouth at bedtime as needed for sedation.    [provider]  losartan (COZAAR) 50 MG tablet Take 50 mg by mouth daily.     [provider]  Polyethyl Glycol-Propyl Glycol (SYSTANE OP) Place 1 drop into both eyes as needed (dry eyes).  [provider]  Probiotic Product (PROBIOTIC PO) Take 1 capsule by mouth daily.    [provider]  Vilazodone HCl (VIIBRYD) 10 MG TABS Take 1 tablet (10 mg total) by mouth daily. TAKE 1 TABLET BY MOUTH DAILY OR AS DIRECTED BY DOCTOR Patient taking differently: Take 10 mg by mouth at bedtime. TAKE 1 TABLET BY MOUTH DAILY OR AS DIRECTED BY DOCTOR 10/08/22   Lomax, Amy, NP  vitamin C (ASCORBIC ACID) 250 MG tablet Take 250 mg by mouth daily.    [provider]  XARELTO 20 MG TABS tablet Take 20 mg by  mouth daily. 12/02/22   [provider]    Family History Family History  Problem Relation Age of Onset   Stomach cancer Father    Sick sinus syndrome Father    Colon cancer Neg Hx     Social History Social History   Tobacco Use   Smoking status: Every Day    Current packs/day: 0.75    Average packs/day: 0.8 packs/day for 30.0 years (22.5 ttl pk-yrs)    Types: Cigarettes    Passive exposure: Current   Smokeless tobacco: Never  Vaping Use   Vaping status: Never Used  Substance Use Topics   Alcohol use: Yes    Alcohol/week: 8.0 standard drinks of alcohol    Types: 8 Cans of beer per week    Comment: 4-5 beers daily    Drug use: No     Allergies   Sulfa antibiotics, Codeine, and Dilaudid [hydromorphone hcl]   Review of Systems Review of Systems Per HPI  Physical Exam Triage Vital Signs ED Triage Vitals  Encounter Vitals Group     BP 08/10/23 1522 129/79     Systolic BP Percentile --      Diastolic BP Percentile --      Pulse Rate 08/10/23 1522 77     Resp 08/10/23 1522 19     Temp 08/10/23 1522 98.3 F (36.8 C)     Temp Source 08/10/23 1522 Oral     SpO2 08/10/23 1522 98 %     Weight --      Height --      Head Circumference --      Peak Flow --      Pain Score 08/10/23 1527 0     Pain Loc --      Pain Education --      Exclude from Growth Chart --    No data found.  Updated Vital Signs BP 129/79 (BP Location: Right Arm)   Pulse 77   Temp 98.3 F (36.8 C) (Oral)   Resp 19   SpO2 98%   Visual Acuity Right Eye Distance:   Left Eye Distance:   Bilateral Distance:    Right Eye Near:   Left Eye Near:    Bilateral Near:     Physical Exam Vitals and nursing note reviewed.  Constitutional:      Appearance: Normal appearance.  HENT:     Head: Atraumatic.     Right Ear: Tympanic membrane and external ear normal.     Left Ear: Tympanic membrane and external ear normal.     Nose: Rhinorrhea present.     Mouth/Throat:     Mouth:  Mucous membranes are moist.     Pharynx: Posterior oropharyngeal erythema present.  Eyes:     Extraocular Movements: Extraocular movements intact.     Conjunctiva/sclera: Conjunctivae normal.  Cardiovascular:     Rate and Rhythm:  Normal rate and regular rhythm.     Heart sounds: Normal heart sounds.  Pulmonary:     Effort: Pulmonary effort is normal.     Breath sounds: Normal breath sounds. No wheezing or rales.  Musculoskeletal:        General: Normal range of motion.     Cervical back: Normal range of motion and neck supple.  Lymphadenopathy:     Cervical: No cervical adenopathy.  Skin:    General: Skin is warm and dry.  Neurological:     Mental Status: She is alert and oriented to person, place, and time.  Psychiatric:        Mood and Affect: Mood normal.        Thought Content: Thought content normal.      UC Treatments / Results  Labs (all labs ordered are listed, but only abnormal results are displayed) Labs Reviewed  CULTURE, GROUP A STREP [facility])  POCT RAPID STREP A (OFFICE)  POC COVID19/FLU A&B COMBO    EKG   Radiology No results found.  Procedures Procedures (including critical care time)  Medications Ordered in UC Medications - No data to display  Initial Impression / Assessment and Plan / UC Course  I have reviewed the triage vital signs and the nursing notes.  Pertinent labs & imaging results that were available during my care of the patient were reviewed by me and considered in my medical decision making (see chart for details).     Rapid strep and COVID and flu negative, suspect viral respiratory infection.  Vitals and exam very reassuring today.  Treat with viscous lidocaine, supportive over-the-counter medications and home care.  Return for worsening symptoms.  Final Clinical Impressions(s) / UC Diagnoses   Final diagnoses:  Viral URI   Discharge Instructions   None    ED Prescriptions     Medication Sig Dispense Auth. Provider    lidocaine (XYLOCAINE) 2 % solution Use as directed 10 mLs in the mouth or throat every 3 (three) hours as needed. 100 mL Particia Nearing, [place]      PDMP not reviewed this encounter.   Particia Nearing, [place] 08/10/23 [phone]

## 2023-08-13 LAB — CULTURE, GROUP A STREP (THRC)

## 2023-08-18 ENCOUNTER — Other Ambulatory Visit: Payer: Self-pay

## 2023-08-18 ENCOUNTER — Emergency Department (HOSPITAL_COMMUNITY): Payer: Medicare PPO

## 2023-08-18 ENCOUNTER — Emergency Department (HOSPITAL_COMMUNITY)
Admission: EM | Admit: 2023-08-18 | Discharge: 2023-08-18 | Disposition: A | Discharge status: Home / Self Care | Payer: Medicare PPO | Attending: Emergency Medicine | Admitting: Emergency Medicine

## 2023-08-18 ENCOUNTER — Encounter (HOSPITAL_COMMUNITY): Payer: Self-pay | Admitting: *Deleted

## 2023-08-18 DIAGNOSIS — Z79899 Other long term (current) drug therapy: Secondary | ICD-10-CM | POA: Insufficient documentation

## 2023-08-18 DIAGNOSIS — Z7901 Long term (current) use of anticoagulants: Secondary | ICD-10-CM | POA: Diagnosis not present

## 2023-08-18 DIAGNOSIS — I700 Atherosclerosis of aorta: Secondary | ICD-10-CM | POA: Diagnosis not present

## 2023-08-18 DIAGNOSIS — J439 Emphysema, unspecified: Secondary | ICD-10-CM | POA: Diagnosis not present

## 2023-08-18 DIAGNOSIS — R079 Chest pain, unspecified: Secondary | ICD-10-CM | POA: Diagnosis not present

## 2023-08-18 DIAGNOSIS — R059 Cough, unspecified: Secondary | ICD-10-CM | POA: Diagnosis not present

## 2023-08-18 DIAGNOSIS — R0789 Other chest pain: Secondary | ICD-10-CM | POA: Diagnosis not present

## 2023-08-18 DIAGNOSIS — M7989 Other specified soft tissue disorders: Secondary | ICD-10-CM | POA: Diagnosis not present

## 2023-08-18 DIAGNOSIS — I10 Essential (primary) hypertension: Secondary | ICD-10-CM | POA: Insufficient documentation

## 2023-08-18 DIAGNOSIS — K449 Diaphragmatic hernia without obstruction or gangrene: Secondary | ICD-10-CM | POA: Diagnosis not present

## 2023-08-18 DIAGNOSIS — R222 Localized swelling, mass and lump, trunk: Secondary | ICD-10-CM | POA: Diagnosis not present

## 2023-08-18 DIAGNOSIS — R1013 Epigastric pain: Secondary | ICD-10-CM | POA: Diagnosis not present

## 2023-08-18 DIAGNOSIS — G35 Multiple sclerosis: Secondary | ICD-10-CM | POA: Insufficient documentation

## 2023-08-18 DIAGNOSIS — Z86718 Personal history of other venous thrombosis and embolism: Secondary | ICD-10-CM | POA: Diagnosis not present

## 2023-08-18 LAB — COMPREHENSIVE METABOLIC PANEL
ALT: 15 U/L (ref 0–44)
AST: 20 U/L (ref 15–41)
Albumin: 4 g/dL (ref 3.5–5.0)
Alkaline Phosphatase: 70 U/L (ref 38–126)
Anion gap: 8 (ref 5–15)
BUN: 10 mg/dL (ref 8–23)
CO2: 26 mmol/L (ref 22–32)
Calcium: 9.3 mg/dL (ref 8.9–10.3)
Chloride: 99 mmol/L (ref 98–111)
Creatinine, Ser: 0.62 mg/dL (ref 0.44–1.00)
GFR, Estimated: >60 mL/min (ref >60)
Glucose, Bld: 93 mg/dL (ref 70–99)
Potassium: 4.4 mmol/L (ref 3.5–5.1)
Sodium: 133 mmol/L — ABNORMAL LOW (ref 135–145)
Total Bilirubin: 0.7 mg/dL (ref 0.0–1.2)
Total Protein: 7.1 g/dL (ref 6.5–8.1)

## 2023-08-18 LAB — BRAIN NATRIURETIC PEPTIDE: B Natriuretic Peptide: 131 pg/mL — ABNORMAL HIGH (ref 0.0–100.0)

## 2023-08-18 LAB — CBC
HCT: 45.4 % (ref 36.0–46.0)
Hemoglobin: 15.7 g/dL — ABNORMAL HIGH (ref 12.0–15.0)
MCH: 34.6 pg — ABNORMAL HIGH (ref 26.0–34.0)
MCHC: 34.6 g/dL (ref 30.0–36.0)
MCV: 100 fL (ref 80.0–100.0)
Platelets: 337 10*3/uL (ref 150–400)
RBC: 4.54 MIL/uL (ref 3.87–5.11)
RDW: 12.8 % (ref 11.5–15.5)
WBC: 7.3 10*3/uL (ref 4.0–10.5)
nRBC: 0 % (ref 0.0–0.2)

## 2023-08-18 LAB — TROPONIN I (HIGH SENSITIVITY)
Troponin I (High Sensitivity): 3 ng/L (ref <18)
Troponin I (High Sensitivity): 3 ng/L (ref <18)

## 2023-08-18 LAB — URINALYSIS, ROUTINE W REFLEX MICROSCOPIC
Bilirubin Urine: NEGATIVE
Glucose, UA: NEGATIVE mg/dL
Hgb urine dipstick: NEGATIVE
Ketones, ur: NEGATIVE mg/dL
Leukocytes,Ua: NEGATIVE
Nitrite: NEGATIVE
Protein, ur: NEGATIVE mg/dL
Specific Gravity, Urine: 1.018 (ref 1.005–1.030)
pH: 7 (ref 5.0–8.0)

## 2023-08-18 LAB — LIPASE, BLOOD: Lipase: 38 U/L (ref 11–51)

## 2023-08-18 MED ORDER — LIDOCAINE VISCOUS HCL 2 % MT SOLN
15.0000 mL | Freq: Once | OROMUCOSAL | Status: AC
  Administered 2023-08-18: 15 mL via ORAL
  Filled 2023-08-18: qty 15

## 2023-08-18 MED ORDER — ONDANSETRON HCL 4 MG/2ML IJ SOLN
4.0000 mg | Freq: Once | INTRAMUSCULAR | Status: AC
  Administered 2023-08-18: 4 mg via INTRAVENOUS
  Filled 2023-08-18: qty 2

## 2023-08-18 MED ORDER — ALUM & MAG HYDROXIDE-SIMETH 200-200-20 MG/5ML PO SUSP
30.0000 mL | Freq: Once | ORAL | Status: AC
  Administered 2023-08-18: 30 mL via ORAL
  Filled 2023-08-18: qty 30

## 2023-08-18 MED ORDER — IOHEXOL 350 MG/ML SOLN
100.0000 mL | Freq: Once | INTRAVENOUS | Status: AC | PRN
  Administered 2023-08-18: 100 mL via INTRAVENOUS

## 2023-08-18 MED ORDER — PANTOPRAZOLE SODIUM 40 MG IV SOLR
40.0000 mg | Freq: Once | INTRAVENOUS | Status: AC
  Administered 2023-08-18: 40 mg via INTRAVENOUS
  Filled 2023-08-18: qty 10

## 2023-08-18 MED ORDER — FAMOTIDINE 20 MG PO TABS: 20.0000 mg | ORAL_TABLET | Freq: Two times a day (BID) | ORAL | 0 refills | Status: AC

## 2023-08-18 NOTE — ED Provider Notes (Signed)
 [place] EMERGENCY DEPARTMENT AT [facility] Provider Note   CSN: [id number] Arrival date & time: 08/18/23  4098     History  Chief Complaint  Patient presents with   Chest Pain    [name] is a 68 y.o. female with hypertension, OA, GERD, MS, diverticulitis, and prior seizure who presents with CP.   Pt c/o epigastric pain; pt states she has been coughing x one week and states she has a hernia and thinks the pain is coming from her hernia. Pain is constant, 8/10, lasted for the last 4 days and not improving. States sometimes it feels pulsatile. Radiates to both sides under her ribs. No ameliorating/aggravating factors. No h/o similar. Coughing has improved, but has had clear sputum and indigestion. No fevers. Mild nausea, no vomiting or diarrhea. No urinary sxs. Mild intermittent bilateral leg swelling.   On xarelto for h/o R sided DVT, but no unilateral leg swelling reported recently, no missed doses of xarelto, no h/o PE.   Past Medical History:  Diagnosis Date   Arthritis    Osteoarthritis   Colon polyps    GERD (gastroesophageal reflux disease)    Hiatal hernia    Hypertension    MS (multiple sclerosis) (HCC) 12/09/2013   Multiple sclerosis (HCC)    Seizures (HCC) 01/04/2005   1 and Only       Home Medications Prior to Admission medications   Medication Sig Start Date End Date Taking? Authorizing Provider  famotidine (PEPCID) 20 MG tablet Take 1 tablet (20 mg total) by mouth 2 (two) times daily. 08/18/23  Yes Loetta Rough, MD  Cholecalciferol 50 MCG (2000 UT) CAPS Take 2,000 Units by mouth daily.     [provider]  Cyanocobalamin (VITAMIN B-12) 3000 MCG SUBL Place 3,000 mcg under the tongue daily.     [provider]  diazepam (VALIUM) 5 MG tablet Take 5 mg by mouth at bedtime as needed for sedation.    [provider]  lidocaine (XYLOCAINE) 2 % solution Use as directed 10 mLs in the mouth or throat every 3 (three) hours as  needed. 08/10/23   Particia Nearing, PA-C  losartan (COZAAR) 50 MG tablet Take 50 mg by mouth daily.     [provider]  Polyethyl Glycol-Propyl Glycol (SYSTANE OP) Place 1 drop into both eyes as needed (dry eyes).    [provider]  Probiotic Product (PROBIOTIC PO) Take 1 capsule by mouth daily.    [provider]  Vilazodone HCl (VIIBRYD) 10 MG TABS Take 1 tablet (10 mg total) by mouth daily. TAKE 1 TABLET BY MOUTH DAILY OR AS DIRECTED BY DOCTOR Patient taking differently: Take 10 mg by mouth at bedtime. TAKE 1 TABLET BY MOUTH DAILY OR AS DIRECTED BY DOCTOR 10/08/22   Lomax, Amy, NP  vitamin C (ASCORBIC ACID) 250 MG tablet Take 250 mg by mouth daily.    [provider]  XARELTO 20 MG TABS tablet Take 20 mg by mouth daily. 12/02/22   [provider]      Allergies    Sulfa antibiotics, Codeine, and Dilaudid [hydromorphone hcl]    Review of Systems   Review of Systems A 10 point review of systems was performed and is negative unless otherwise reported in HPI.  Physical Exam Updated Vital Signs BP 125/76 (BP Location: Left Arm)   Pulse 66   Temp 97.9 F (36.6 C) (Oral)   Resp 16   Ht 5\' 5"  (1.651  m)   Wt 49.9 kg   SpO2 99%   BMI 18.30 kg/m  Physical Exam General: Normal appearing female, lying in bed.  HEENT: Sclera anicteric, MMM, trachea midline.  Cardiology: RRR, no murmurs/rubs/gallops.  Resp: Normal respiratory rate and effort. CTAB, no wheezes, rhonchi, crackles.  Abd: Soft, tenderness to palpation in epigastric region, non-distended. No rebound tenderness or guarding. No ventral hernias palpable, no intraabdominal masses palpable.  GU: Deferred. MSK: No peripheral edema or signs of trauma. Extremities without deformity or TTP. No cyanosis or clubbing. Skin: warm, dry.  Neuro: A&Ox4, CNs II-XII grossly intact. MAEs. Sensation grossly intact.  Psych: Normal mood and affect.   ED Results / Procedures / Treatments    Labs (all labs ordered are listed, but only abnormal results are displayed) Labs Reviewed  CBC - Abnormal; Notable for the following components:      Result Value   Hemoglobin 15.7 (*)    MCH 34.6 (*)    All other components within normal limits  COMPREHENSIVE METABOLIC PANEL - Abnormal; Notable for the following components:   Sodium 133 (*)    All other components within normal limits  URINALYSIS, ROUTINE W REFLEX MICROSCOPIC - Abnormal; Notable for the following components:   Color, Urine STRAW (*)    All other components within normal limits  BRAIN NATRIURETIC PEPTIDE - Abnormal; Notable for the following components:   B Natriuretic Peptide 131.0 (*)    All other components within normal limits  LIPASE, BLOOD  TROPONIN I (HIGH SENSITIVITY)  TROPONIN I (HIGH SENSITIVITY)    EKG EKG Interpretation Date/Time:  Tuesday August 18 2023 07:01:49 EST Ventricular Rate:  84 PR Interval:  143 QRS Duration:  65 QT Interval:  355 QTC Calculation: 420 R Axis:   81  Text Interpretation: Sinus rhythm Probable left atrial enlargement Borderline right axis deviation Probable anteroseptal infarct, old No prior for comparison Confirmed by Vivi Barrack [phone]) on 08/18/2023 7:07:05 AM  Radiology CT Angio Chest/Abd/Pel for Dissection W and/or Wo Contrast Result Date: 08/18/2023 CLINICAL DATA:  Acute aortic syndrome (AAS) suspected Pt c/o epigastric pain; pt states she has been coughing x one week and states she has a hernia and thinks the pain is coming from her hernia EXAM: CT ANGIOGRAPHY CHEST, ABDOMEN AND PELVIS TECHNIQUE: Non-contrast CT of the chest was initially obtained. Multidetector CT imaging through the chest, abdomen and pelvis was performed using the standard protocol during bolus administration of intravenous contrast. Multiplanar reconstructed images and MIPs were obtained and reviewed to evaluate the vascular anatomy. RADIATION DOSE REDUCTION: This exam was performed according to  the departmental dose-optimization program which includes automated exposure control, adjustment of the mA and/or kV according to patient size and/or use of iterative reconstruction technique. CONTRAST:  OMNIPAQUE IOHEXOL 350 MG/ML SOLN COMPARISON:  Chest XR, concurrent. CTA PE, 10/28/2022 and 11/14/2018. CT L-spine, 05/17/2023. FINDINGS: CTA CHEST FINDINGS Cardiovascular: Preferential opacification of the thoracic aorta. No evidence of thoracic aortic aneurysm or dissection. No segmental or larger pulmonary embolus. Normal heart size. No pericardial effusion. Mediastinum/Nodes: No enlarged mediastinal, hilar, or axillary lymph nodes. Thyroid gland, trachea, and esophagus demonstrate no significant findings. Lungs/Pleura: Apical-predominant centrilobular emphysematous lung change. RIGHT apicolateral subpleural pulmonary cysts. Lungs are otherwise clear without focal consolidation, mass or suspicious pulmonary nodule. No pleural effusion or pneumothorax. Musculoskeletal: No acute chest wall abnormality. Review of the MIP images confirms the above findings. CTA ABDOMEN AND PELVIS FINDINGS VASCULAR Aorta: Normal caliber aorta without aneurysm, dissection, vasculitis or significant stenosis.  Celiac: Patent without evidence of aneurysm, dissection, vasculitis or significant stenosis. SMA: Patent without evidence of aneurysm, dissection, vasculitis or significant stenosis. Renals: Four renal arteries are present, each side with a main and accessory arteries. All renal arteries are patent without evidence of aneurysm, dissection, vasculitis, fibromuscular dysplasia or significant stenosis. IMA: Patent without evidence of aneurysm, dissection, vasculitis or significant stenosis. Inflow: Patent without evidence of aneurysm, dissection, vasculitis or significant stenosis. Veins: No obvious venous abnormality within the limitations of this arterial phase study. Review of the MIP images confirms the above findings.  NON-VASCULAR Hepatobiliary: Normal volume liver with smooth contour. Sub-1 cm (0.6 cm) subcapsular arterially-enhancing foci within the RIGHT LEFT hepatic lobes without discrete lesions, likely small flash-filling hemangiomas. No gallstones, gallbladder wall thickening, or biliary dilatation. Pancreas: Stable size and appearance of 2.0 x 1.5 cm cystic mass at the pancreatic tail. No pancreatic ductal dilatation or surrounding inflammatory changes. Spleen: Normal in size without focal abnormality. Adrenals/Urinary Tract: Adrenal glands are unremarkable. Kidneys are normal, without renal calculi, focal lesion, or hydronephrosis. Bladder is unremarkable. Stomach/Bowel: Small hiatus hernia. Stomach is otherwise within normal limits. Appendix appears normal. No evidence of bowel wall thickening, distention, or inflammatory changes. Lymphatic: No enlarged abdominal or pelvic lymph nodes. Reproductive: Uterus and adnexa are unremarkable. Other: No abdominal wall hernia or abnormality. 1.1 cm peripherally-calcified ovoid focus at RIGHT the pelvis, likely a torsed, calcified epiploic appendage. No abdominopelvic ascites. Musculoskeletal: LEFT proximal humerus ORIF. Facet arthropathy and degenerative changes of lower lumbar spine. No acute osseous findings. Review of the MIP images confirms the above findings. IMPRESSION: 1. No acute vascular or nonvascular thoracoabdominal process. 2. Small hiatus hernia and stable 2.0 cm cystic pancreatic lesion at the tail. Consider routine, non-emergent/outpatient pancreatic mass protocol for further characterization, if not previously performed. 3. Aortic Atherosclerosis (ICD10-I70.0) and Emphysema (ICD10-J43.9). Additional incidental, chronic and senescent findings as above. Electronically Signed   By: Roanna Banning M.D.   On: 08/18/2023 10:20   DG Chest Port 1 View Result Date: 08/18/2023 CLINICAL DATA:  Chest pain. EXAM: PORTABLE CHEST 1 VIEW COMPARISON:  08/06/2016. FINDINGS: The  heart size and mediastinal contours are within normal limits. There is atherosclerotic calcification of the aorta. Hyperinflation of the lungs is noted. Minimal atelectasis or scarring is present at the right lung base. No effusion or pneumothorax is seen. Fixation hardware is noted in the proximal left humerus. No acute osseous abnormality. IMPRESSION: Hyperinflation of the lungs with no active disease. Electronically Signed   By: Thornell Sartorius M.D.   On: 08/18/2023 07:49    Procedures Procedures    Medications Ordered in ED Medications  pantoprazole (PROTONIX) injection 40 mg (40 mg Intravenous Given 08/18/23 0753)  ondansetron (ZOFRAN) injection 4 mg (4 mg Intravenous Given 08/18/23 0753)  iohexol (OMNIPAQUE) 350 MG/ML injection 100 mL (100 mLs Intravenous Contrast Given 08/18/23 0826)  alum & mag hydroxide-simeth (MAALOX/MYLANTA) 200-200-20 MG/5ML suspension 30 mL (30 mLs Oral Given 08/18/23 1039)    And  lidocaine (XYLOCAINE) 2 % viscous mouth solution 15 mL (15 mLs Oral Given 08/18/23 1039)    ED Course/ Medical Decision Making/ A&P                          Medical Decision Making Amount and/or Complexity of Data Reviewed Labs: ordered. Decision-making details documented in ED Course. Radiology: ordered. Decision-making details documented in ED Course.  Risk OTC drugs. Prescription drug management.    This patient presents to  the ED for concern of epigastric pain/indigestion, this involves an extensive number of treatment options, and is a complaint that carries with it a high risk of complications and morbidity.  I considered the following differential and admission for this acute, potentially life threatening condition. Overall patient is very well-appearing and HDS.  MDM:    For DDX for abdominal pain includes but is not limited to:  Abdominal exam without peritoneal signs. No evidence of acute abdomen at this time. Consider acute aortic disease such as aneurysm, acute  hepatobiliary disease (including acute cholecystitis or cholangitis), acute pancreatitis, PUD (including gastric perforation), patient's known hiatal hernia, ACS/angina, pneumonia, HF exacerbation. Does have h/o DVT but is treated w/ xarelto and no CP/SOB, no signs/sxs of DVT, lower c/f PE. EKG doesn't demonstrate pericarditis or ischemia.    Clinical Course as of 08/18/23 1113  Tue Aug 18, 2023  0812 CBC(!) Unremarkable in the context of this patient's presentation  [HN]  0812 DG Chest Port 1 View Hyperinflation of the lungs with no active disease. [HN]  0812 Lipase: 38 wnl [HN]  0906 Troponin I (High Sensitivity): 3 neg [HN]  0906 Comprehensive metabolic panel(!) Unremarkable in the context of this patient's presentation  [HN]  1021 Troponin I (High Sensitivity): 3 Flat trop 3x3 [HN]  1026 CT Angio Chest/Abd/Pel for Dissection W and/or Wo Contrast 1. No acute vascular or nonvascular thoracoabdominal process. 2. Small hiatus hernia and stable 2.0 cm cystic pancreatic lesion at the tail. Consider routine, non-emergent/outpatient pancreatic mass protocol for further characterization, if not previously performed. 3. Aortic Atherosclerosis (ICD10-I70.0) and Emphysema (ICD10-J43.9).   [HN]  1111 Patient w/ minimal effect from protonix/GI cocktail. However she states she feels well and just wanted to get checked out. She is overall well-appearing, HDS, no abd TTP. Her sxs do not seem exertional in nature, she does not c/o chest pain, low c/f angina. Patient states she is ready to be discharged. She is instructed to f/u with her PCP within 1-2 weeks. Will be given pepcid rx and instructed to take that as well as tylenol. DC w/ discharge instructions/return precautions. All questions answered to patient's satisfaction.   [HN]    Clinical Course User Index [HN] Loetta Rough, MD    Labs: I Ordered, and personally interpreted labs.  The pertinent results include:  those listed  above  Imaging Studies ordered: I ordered imaging studies including CXR, CT dissection study I independently visualized and interpreted imaging. I agree with the radiologist interpretation  Additional history obtained from chart review.    Cardiac Monitoring: The patient was maintained on a cardiac monitor.  I personally viewed and interpreted the cardiac monitored which showed an underlying rhythm of: NSR  Reevaluation: After the interventions noted above, I reevaluated the patient and found that they have :improved  Social Determinants of Health:  Lives independently  Disposition:  DC  Co morbidities that complicate the patient evaluation  Past Medical History:  Diagnosis Date   Arthritis    Osteoarthritis   Colon polyps    GERD (gastroesophageal reflux disease)    Hiatal hernia    Hypertension    MS (multiple sclerosis) (HCC) 12/09/2013   Multiple sclerosis (HCC)    Seizures (HCC) 01/04/2005   1 and Only     Medicines Meds ordered this encounter  Medications   pantoprazole (PROTONIX) injection 40 mg   ondansetron (ZOFRAN) injection 4 mg   iohexol (OMNIPAQUE) 350 MG/ML injection 100 mL   AND Linked Order Group  alum & mag hydroxide-simeth (MAALOX/MYLANTA) 200-200-20 MG/5ML suspension 30 mL    lidocaine (XYLOCAINE) 2 % viscous mouth solution 15 mL   famotidine (PEPCID) 20 MG tablet    Sig: Take 1 tablet (20 mg total) by mouth 2 (two) times daily.    Dispense:  30 tablet    Refill:  0    I have reviewed the patients home medicines and have made adjustments as needed  Problem List / ED Course: Problem List Items Addressed This Visit   None Visit Diagnoses       Epigastric pain    -  Primary                   This note was created using dictation software, which may contain spelling or grammatical errors.    Loetta Rough, MD 08/18/23 [phone]

## 2023-08-18 NOTE — Discharge Instructions (Addendum)
 Thank you for coming to [facility] Emergency Department. You were seen for upper abdominal discomfort/indigestion. We did an exam, labs, and imaging, and these showed no acute findings. Please follow up about your pancreatic lesion. Please take pepcid 20 mg twice per day for 30 days. You can take tylenol 1,000 mg every 8 hours for pain as well. Please follow up with your primary care provider within 1 week.   Do not hesitate to return to the ED or call 911 if you experience: -Worsening symptoms -Chest pain, shortness of breath -Nausea/vomiting so severe you cannot eat/drink anything -Lightheadedness, passing out -Fevers/chills -Anything else that concerns you

## 2023-08-18 NOTE — ED Triage Notes (Signed)
 Pt c/o epigastric pain; pt states she has been coughing x one week and states she has a hernia and thinks the pain is coming from her hernia

## 2023-08-26 NOTE — Telephone Encounter (Signed)
 She is scheduled at [facility] for March 26. [name]: [id number] exp. 09/16/2023 - 11/15/2023

## 2023-09-01 DIAGNOSIS — F17200 Nicotine dependence, unspecified, uncomplicated: Secondary | ICD-10-CM | POA: Diagnosis not present

## 2023-09-01 DIAGNOSIS — R350 Frequency of micturition: Secondary | ICD-10-CM | POA: Diagnosis not present

## 2023-09-01 DIAGNOSIS — Z681 Body mass index (BMI) 19 or less, adult: Secondary | ICD-10-CM | POA: Diagnosis not present

## 2023-09-01 DIAGNOSIS — G35 Multiple sclerosis: Secondary | ICD-10-CM | POA: Diagnosis not present

## 2023-09-11 ENCOUNTER — Encounter (INDEPENDENT_AMBULATORY_CARE_PROVIDER_SITE_OTHER): Payer: Self-pay | Admitting: *Deleted

## 2023-09-16 DIAGNOSIS — G35 Multiple sclerosis: Secondary | ICD-10-CM | POA: Diagnosis not present

## 2023-10-08 ENCOUNTER — Encounter: Payer: Self-pay | Admitting: Neurology

## 2023-10-08 ENCOUNTER — Ambulatory Visit: Payer: Medicare PPO | Admitting: Neurology

## 2023-10-08 VITALS — BP 119/77 | HR 89 | Ht 64.0 in | Wt 114.0 lb

## 2023-10-08 DIAGNOSIS — G35 Multiple sclerosis: Secondary | ICD-10-CM

## 2023-10-08 NOTE — Progress Notes (Addendum)
 Provider:  Melvyn Novas, MD  Primary Care Physician:  Assunta Found, MD [address] [place] [address]     Referring Provider: Assunta Found, Md [address] [place],  [place] [address]          Chief Complaint according to patient   Patient presents with:                HISTORY OF PRESENT ILLNESS:  [name] is a 68 y.o. female MS patient who is here for revisit 10/08/2023 for follow up on MRI:   [name] is for a long time and established patient with multiple sclerosis in our office, she has seen me in person the last time 3 years ago and has in the interval.  Followed up with Shawnie Dapper, NP.  Amy ordered a new MRI as a routine for every 3 years and this was done through the [facility].  At [facility] the previous image from 2022 was also performed and so they could be compared and show no progression but the old scars of demyelination from multiple sclerosis.  So VRS are assuming at this time that there is no active disease the patient is no longer on medications; stopped Copaxone at age 23.    No longer active disease. No longer on medications and no change in MRI times 2 since cessation of therapy .  She has had a UTI last winter,  had hyponatremia.   10/08/22 ALL: [name] returns for follow up for MS. She was last seen by Dr Vickey Huger 09/2021 and continued to do well on Capaxone. Last MRI stable 09/2020 with [facility].  Since, she reports stopping Capaxone after last appt with Dr Vickey Huger, MD.  She is doing well. No new or exacerbating symptoms. Gait is stable. No assistive device. She lives alone. Drives without difficulty with exception of not being as comfortable driving on the highway. She feels she could get lost easily. She performs ADLs independently. She manages finances. She is sleeping fairly well. She does wake often with hot flashes. She is retired and able to nap if needed. She continues Viibryd. Mood is good. She does continue to smoke.  She was diagnosed with second DVT 07/2022. Now on Xarelto indefinitely. Last DVT about 6 years ago after shoulder surgery.    10/10/2021 CD: HPI:  [name] is a 68 y.o. female , who was originally seen here as a referral from [facility] and Dr Hale Bogus, Md in [place] - PCP Dr. Phillips Odor , MD , and seen first in 2014 for a transfer of care for multiple sclerosis.  Mrs Scogin reports that she received the diagnosis of multiple sclerosis at age 58. She was diagnosed by Dr. Janann Colonel, after years of having symptoms that included skin dysesthesias , numbness and right eye vision loss or blurring of vision- Soon afterwards she was seen by Dr. Tinnie Gens ,a multiple sclerosis specialist and followed him from the [facility] office to the [facility] office. She is now looking for followup care , also within the network of her other physicians.  Original diagnosis was established by clinical history, abnormal brain MRI and a spinal tap positive for oligoclonal bands. A copy of one of her last MRI is is available to me. Her brain MRI with and without contrast was compared to a study from 2012 and documented only  minimal  Disease progression. The patient had mild ventricular enlargement unchanged for the  last 3 years. Multiple areas of increased white matter signal in the periventricular space, significant involvement of the corpus callosum and classic Dawson's fingers. Involvement of white matter demyelination in the temporal area. Dr. Alexia Angelucci interpreted the study as  moderate to severe disease burden , referenced  to her long-standing multiple sclerosis. The patient's most recent blood test results were also attached ( referral papers)- she has a normal white blood cell count she's not anemic, there is  normal kidney function ,normal liver function and  she is not diabetic. She's currently controlled on Copaxone  with 40 MG 3 TIMES A WEEK THE PATIENT SWITCHED LAST SUMMER FROM daily to the 3 Times Weekly Formulation.   She has Relapsing Remitting Multiple Sclerosis , she has noticed neither  clinical changes nor side effects since switching in the formulation.    10-10-2021: CD RV for [name] , a meanwhile 68 year old caucasian MS patient  who has done very well on Copaxone for almost a decade.  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  She was diagnosed July 15th 2006 with MS.  RM 10, alone. 6 month f/u. Last seen 03/07/21 by AL,NP. MS DMT: Copaxone. Doing well, no new sx or concerns. MRI showed no active lesions , no relapses.  She reported some bowel problems, diverticulitis, polyposis coli, and struggles with diarrhea.  She still smokes. She lost weight due to her GI problems, BMI is 18.    [name] is a 68 y.o. female , who was originally seen here as a referral from [facility] at [facility] and Dr Alexia Angelucci, Md in [place] - PCP Dr. Glady Laming , MD , and seen first in 2014 for a transfer of care for multiple sclerosis.  Mrs Fore reports that she received the diagnosis of multiple sclerosis at age 79. She was diagnosed by Dr. Julien Odor, after years of having symptoms that included skin dysesthesias , numbness and right eye vision loss or blurring of vision- Soon afterwards she was seen by Dr. Susana Enter ,a multiple sclerosis specialist and followed him from the [facility] office to the [facility] office. She is now looking for followup care , also within the network of her other physicians.  Original diagnosis was established by clinical history, abnormal brain MRI and a spinal tap positive for oligoclonal bands. A copy of one of her last MRI is is available to me. Her brain MRI with and without contrast was compared to a study from 2012 and documented only  minimal  Disease progression. The patient had mild ventricular enlargement unchanged for the last 3 years. Multiple areas of increased white matter signal in the periventricular space, significant involvement of the corpus callosum and classic  Dawson's fingers. Involvement of white matter demyelination in the temporal area. Dr. Alexia Angelucci interpreted the study as  moderate to severe disease burden , referenced  to her long-standing multiple sclerosis. The patient's most recent blood test results were also attached ( referral papers)- she has a normal white blood cell count she's not anemic, there is  normal kidney function ,normal liver function and  she is not diabetic. She's currently controlled on Copaxone  with 40 MG 3 TIMES A WEEK THE PATIENT SWITCHED LAST SUMMER FROM daily to the 3 Times Weekly Formulation.  She has Relapsing Remitting Multiple Sclerosis , she has noticed neither  clinical changes nor side effects since switching in the formulation.     10-10-2021: CD RV for [name] , a  meanwhile 68 year old caucasian MS patient  who has done very well on Copaxone for almost a decade.  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  She was diagnosed July 15th 2006 with MS.  RM 10, alone. 6 month f/u. Last seen 03/07/21 by AL,NP. MS DMT: Copaxone. Doing well, no new sx or concerns. MRI showed no active lesions , no relapses.  She reported some bowel problems, diverticulitis, polyposis coli, and struggles with diarrhea.  She still smokes. She lost weight due to her GI problems, BMI is 18.       Review of Systems: Out of a complete 14 system review, the patient complains of only the following symptoms, and all other reviewed systems are negative.:    Fatigue is still present and she is sleeping well, weight stabilized at BMI 18.   Leukopenia.              Component Ref Range & Units (hover) 1 mo ago (08/18/23) 4 mo ago (05/20/23) 4 mo ago (05/19/23) 4 mo ago (05/17/23) 1 yr ago (10/10/21) 1 yr ago (10/10/21) 3 yr ago (11/14/19)  Sodium 133 Low  127 Low  124 Low  125 Low   137 R 128 Low   Potassium 4.4 4.3 4.1 4.4  4.7 R 3.4 Low   Chloride 99 97 Low  92 Low  93 Low   99 R 91 Low   CO2 26 23 24 23  24  R  25  Glucose, Bld 93 74 CM 93 CM 107 High  CM  99 101 High  CM      Social History   Socioeconomic History   Marital status: Single    Spouse name: Not on file   Number of children: 0   Years of education: College   Highest education level: Not on file  Occupational History    Employer: DISABLED  Tobacco Use   Smoking status: Every Day    Current packs/day: 0.75    Average packs/day: 0.8 packs/day for 30.0 years (22.5 ttl pk-yrs)    Types: Cigarettes    Passive exposure: Current   Smokeless tobacco: Never  Vaping Use   Vaping status: Never Used  Substance and Sexual Activity   Alcohol use: Yes    Alcohol/week: 8.0 standard drinks of alcohol    Types: 8 Cans of beer per week    Comment: 4-5 beers daily    Drug use: No   Sexual activity: Not on file  Other Topics Concern   Not on file  Social History Narrative   Patient is single and lives alone.   Patient has a college education   Patient is right-handed.   Patient is retired.   Patient drinks three cups of coffee daily.   Social Drivers of Corporate investment banker Strain: Low Risk  (02/25/2022)   Overall Financial Resource Strain (CARDIA)    Difficulty of Paying Living Expenses: Not hard at all  Food Insecurity: No Food Insecurity (05/19/2023)   Hunger Vital Sign    Worried About Running Out of Food in the Last Year: Never true    Ran Out of Food in the Last Year: Never true  Transportation Needs: No Transportation Needs (05/19/2023)   PRAPARE - Administrator, Civil Service (Medical): No    Lack of Transportation (Non-Medical): No  Physical Activity: Not on file  Stress: Not on file  Social Connections: Unknown (10/28/2021)   [facility]   Social Network    Social Network: Not on file    Family History  Problem Relation Age of Onset   Stomach cancer Father    Sick sinus syndrome Father    Colon cancer Neg Hx     Past Medical History:  Diagnosis Date    Arthritis    Osteoarthritis   Colon polyps    GERD (gastroesophageal reflux disease)    Hiatal hernia    Hypertension    MS (multiple sclerosis) (HCC) 12/09/2013   Multiple sclerosis (HCC)    Seizures (HCC) 01/04/2005   1 and Only    Past Surgical History:  Procedure Laterality Date   COLONOSCOPY     COLONOSCOPY N/A 09/16/2012   Procedure: COLONOSCOPY;  Surgeon: Malissa Hippo, MD;  [facility]: [facility];  Service: Endoscopy;  Laterality: N/A;  1030-rescheduled to 9:30 Ann notified pt   COLONOSCOPY N/A 01/05/2020   Procedure: COLONOSCOPY;  Surgeon: Malissa Hippo, MD;  Location: [facility];  Service: Endoscopy;  Laterality: N/A;  730   COLONOSCOPY WITH PROPOFOL N/A 12/23/2022   Procedure: COLONOSCOPY WITH PROPOFOL;  Surgeon: Dolores Frame, MD;  Location: [facility];  Service: Gastroenterology;  Laterality: N/A;  8:30am;ASA 3   DILATION AND CURETTAGE OF UTERUS     ORIF HUMERUS FRACTURE Left 02/16/2017   Procedure: OPEN REDUCTION INTERNAL FIXATION (ORIF) LEFT PROXIMAL HUMERUS FRACTURE;  Surgeon: Cammy Copa, MD;  Location: [facility] OR;  Service: Orthopedics;  Laterality: Left;   POLYPECTOMY  01/05/2020   Procedure: POLYPECTOMY;  Surgeon: Malissa Hippo, MD;  [facility];  Service: Endoscopy;;   POLYPECTOMY  12/23/2022   Procedure: POLYPECTOMY;  Surgeon: Dolores Frame, MD;  [facility]: [facility];  Service: Gastroenterology;;     Current Outpatient Medications on File Prior to Visit  Medication Sig Dispense Refill   Cholecalciferol 50 MCG (2000 UT) CAPS Take 2,000 Units by mouth daily.      Cyanocobalamin (VITAMIN B-12) 3000 MCG SUBL Place 3,000 mcg under the tongue daily.      diazepam (VALIUM) 5 MG tablet Take 5 mg by mouth at bedtime as needed for sedation.     famotidine (PEPCID) 20 MG tablet Take 1 tablet (20 mg total) by mouth 2 (two) times daily. 30 tablet 0   lidocaine (XYLOCAINE) 2 % solution Use as directed 10 mLs in the mouth or  throat every 3 (three) hours as needed. 100 mL 0   losartan (COZAAR) 50 MG tablet Take 50 mg by mouth daily.      Polyethyl Glycol-Propyl Glycol (SYSTANE OP) Place 1 drop into both eyes as needed (dry eyes).     Probiotic Product (PROBIOTIC PO) Take 1 capsule by mouth daily.     Vilazodone HCl (VIIBRYD) 10 MG TABS Take 1 tablet (10 mg total) by mouth daily. TAKE 1 TABLET BY MOUTH DAILY OR AS DIRECTED BY DOCTOR (Patient taking differently: Take 10 mg by mouth at bedtime. TAKE 1 TABLET BY MOUTH DAILY OR AS DIRECTED BY DOCTOR) 90 tablet 3   vitamin C (ASCORBIC ACID) 250 MG tablet Take 250 mg by mouth daily.     XARELTO 20 MG TABS tablet Take 20 mg by mouth daily.     No current facility-administered medications on file prior to visit.    Allergies  Allergen Reactions   Sulfa Antibiotics     Unsure    Codeine Nausea And Vomiting   Dilaudid [Hydromorphone Hcl] Nausea And Vomiting  Zofran helped     DIAGNOSTIC DATA (LABS, IMAGING, TESTING) - I reviewed patient records, labs, notes, testing and imaging myself where available.  Lab Results  Component Value Date   WBC 7.3 08/18/2023   HGB 15.7 (H) 08/18/2023   HCT 45.4 08/18/2023   MCV 100.0 08/18/2023   PLT 337 08/18/2023      Component Value Date/Time   NA 133 (L) 08/18/2023 0735   NA 137 10/10/2021 1141   K 4.4 08/18/2023 0735   CL 99 08/18/2023 0735   CO2 26 08/18/2023 0735   GLUCOSE 93 08/18/2023 0735   BUN 10 08/18/2023 0735   BUN 9 10/10/2021 1141   CREATININE 0.62 08/18/2023 0735   CALCIUM 9.3 08/18/2023 0735   PROT 7.1 08/18/2023 0735   PROT 6.9 10/10/2021 1141   ALBUMIN 4.0 08/18/2023 0735   ALBUMIN 4.8 10/10/2021 1141   AST 20 08/18/2023 0735   ALT 15 08/18/2023 0735   ALKPHOS 70 08/18/2023 0735   BILITOT 0.7 08/18/2023 0735   BILITOT 0.5 10/10/2021 1141   GFRNONAA >60 08/18/2023 0735   GFRAA >60 11/14/2019 1214   Lab Results  Component Value Date   CHOL 187 10/10/2021   HDL 105 10/10/2021   LDLCALC 71  10/10/2021   TRIG 54 10/10/2021   CHOLHDL 1.8 10/10/2021   No results found for: "HGBA1C" No results found for: "VITAMINB12" Lab Results  Component Value Date   TSH 1.648 05/19/2023    PHYSICAL EXAM:  Today's Vitals   10/08/23 1325  BP: 119/77  Pulse: 89  Weight: 114 lb (51.7 kg)  Height: 5\' 4"  (1.626 m)   Body mass index is 19.57 kg/m.   Wt Readings from Last 3 Encounters:  10/08/23 114 lb (51.7 kg)  08/18/23 110 lb (49.9 kg)  05/19/23 113 lb 0.1 oz (51.3 kg)     Ht Readings from Last 3 Encounters:  10/08/23 5\' 4"  (1.626 m)  08/18/23 5\' 5"  (1.651 m)  05/19/23 5\' 4"  (1.626 m)      General: The patient is awake, alert and appears not in acute distress. The patient is appearing older than her numeric age  Head: Normocephalic, atraumatic. General: The patient is awake, alert and appears  in acute distress from heat and humidity. The patient is well groomed. Head:Aaron Aas Mallampati 2 , neck circumference: 13.7,  no TMJ pain or click , no delayed swalling  no neck tenderness.  Retrognathia, small mouth,  Cardiovascular:  Regular rate and rhythm, without  murmurs or carotid bruit, and without distended neck veins. Respiratory: Lungs are clear to auscultation. Skin:  Without evidence of edema, or rash Trunk:  18 kg/m2.     Neurologic exam : The patient is awake and alert, oriented to place and time. Memory subjective described as intact.  There is a normal attention span & concentration ability. Speech is fluent without dysarthria, dysphonia or aphasia.  Mood and affect are appropriate.   Cranial nerves: No change in taste or smell. Pupils are equal and briskly reactive to light.  Extraocular movements  in vertical and horizontal planes intact and with end point nystagmus.  Visual fields by finger perimetry are intact.  Hearing to finger rub intact. Facial sensation intact to fine touch.  Facial motor strength is symmetric,  tongue and uvula move midline. Motor exam:   Increased tone in the proximal extremities, slight tremor, no rigidity.  Left humerus fracture left her with surgical repair scar, Dr Rozelle Corning.   Both biceps with increased tone.  Symmetric muscle bulk and symmetric tone - but left arm strength reduced -   Left arm can be extended fully.  Very strong grip strength bilaterally- no tremor.  Sensory:  Fine touch and vibration were normal.    Coordination: Rapid alternating movements in the fingers/hands is tested and normal.  Finger-to-nose maneuver tested - no evidence of ataxia, dysmetria.   On the right: no pronator drift , no bilateral tremor seen today.  Gait and station: Patient walks without assistive device. Ataxia on tandem gait, heel stand and toe stand were deferred, Romberg negative. Strength within normal limits.  Stance is stable.   Deep tendon reflexes: in the  upper and lower extremities are symmetric ,very brisk- 3 plus , but not clonus.   Patella reflex is  very brisk, 3 plus.  Babinski maneuver response is downgoing.  ASSESSMENT AND PLAN 68 y.o. year old female  here with:    1) MS since 2006,  was on Copaxone - now off   2) follow  up once a year or prn, Amy Lomax, NP   I plan to follow up through our NP within 12 months.   I would like to thank Minus Amel, MD and Minus Amel, Md [address] [place],  [place] [address] for allowing me to meet with and to take care of this pleasant patient.     After spending a total time of  35  minutes face to face and additional time for physical and neurologic examination, review of laboratory studies,  personal review of imaging studies, reports and results of other testing and review of referral information / records as far as provided in visit,   Electronically signed by: Neomia Banner, MD 10/08/2023 2:05 PM  [facility] Board certified by The [employer] of Sleep Medicine and Diplomate of the [employer] of Sleep  Medicine. Board certified In Neurology through the ABPN, Fellow of the [employer] of Neurology.

## 2023-10-18 ENCOUNTER — Encounter

## 2023-10-19 ENCOUNTER — Encounter (INDEPENDENT_AMBULATORY_CARE_PROVIDER_SITE_OTHER): Payer: Self-pay | Admitting: Gastroenterology

## 2023-10-19 ENCOUNTER — Ambulatory Visit (INDEPENDENT_AMBULATORY_CARE_PROVIDER_SITE_OTHER): Payer: Medicare PPO | Admitting: Gastroenterology

## 2023-10-19 VITALS — BP 148/85 | HR 86 | Temp 97.8°F | Ht 64.0 in | Wt 115.2 lb

## 2023-10-19 DIAGNOSIS — K869 Disease of pancreas, unspecified: Secondary | ICD-10-CM | POA: Diagnosis not present

## 2023-10-19 DIAGNOSIS — K644 Residual hemorrhoidal skin tags: Secondary | ICD-10-CM | POA: Insufficient documentation

## 2023-10-19 MED ORDER — HYDROCORTISONE (PERIANAL) 2.5 % EX CREA
1.0000 | TOPICAL_CREAM | Freq: Two times a day (BID) | CUTANEOUS | 0 refills | Status: AC
Start: 2023-10-19 — End: ?

## 2023-10-19 NOTE — Progress Notes (Unsigned)
 [name], M.D. Gastroenterology & Hepatology [facility] Gastroenterology [address] [place], [place] [address]  Primary Care Physician: Minus Amel, MD [address] [place] [address]  I will communicate my assessment and recommendations to the referring MD via EMR.  Problems: Symptomatic external hemorrhoids History of diverticulitis Pancreatic lesion  History of Present Illness: [name] is a 68 y.o. female with past medical history of osteoarthritis, GERD, hiatal hernia, hypertension, multiple sclerosis and seizures,  who presents for follow up of pancreatic lesion and evaluation of hemorrhoids.  The patient was last seen on 10/28/2018 for. At that time, the patient underwent a CT abdomen and pelvis with IV contrast for evaluation of abdominal pain which showed descending and sigmoid diverticulosis with mild diverticulitis in the sigmoid colon.  There was a 2.0 cm cystic lesion in the pancreatic tail, there was a second lesion with mild increase in size of the pancreatic uncinate cystic lesion up to 1.4 cm.  She was recommended to have an MRI performed in 1 year.  Notably, the patient underwent a CT angio of the chest, abdomen and pelvis as part of evaluation of acute aortic syndrome in August 18, 2023.  Her pancreas at that time showed a 2 x 1.5 cm cystic mass in the pancreatic tail without pancreatic ductal dilation or surrounding inflammatory changes.  Patient was given a 2-week course of ciprofloxacin and Flagyl for management of her diverticulitis.  Eventually underwent a colonoscopy for surveillance with findings described below.  Patient reports that on Thursday she started feeling a lump in her anal area which was painful. Reports yesterday she has noticed there is fresh blood coming from her rectal area. She is now using a pad due to the bleeding.She has used Preparation H suppository 3-4 times a day without resolution.    The patient denies having any nausea, vomiting, fever, chills, melena, hematemesis, abdominal distention, abdominal pain, diarrhea, jaundice, pruritus or weight loss.  Last EGD: never Last Colonoscopy: 12/23/2022 - Three 3 to 6 mm polyps in the transverse colon, removed with a cold snare. Resected and retrieved. - Diverticulosis in the sigmoid colon and in the descending colon. - Non- bleeding internal hemorrhoids.  Polyps were tubular adenomas. Repeat colonoscopy in 5 years   Past Medical History: Past Medical History:  Diagnosis Date   Arthritis    Osteoarthritis   Colon polyps    GERD (gastroesophageal reflux disease)    Hiatal hernia    Hypertension    MS (multiple sclerosis) (HCC) 12/09/2013   Multiple sclerosis (HCC)    Seizures (HCC) 01/04/2005   1 and Only    Past Surgical History: Past Surgical History:  Procedure Laterality Date   COLONOSCOPY     COLONOSCOPY N/A 09/16/2012   Procedure: COLONOSCOPY;  Surgeon: Ruby Corporal, MD;  [facility];  Service: Endoscopy;  Laterality: N/A;  1030-rescheduled to 9:30 Ann notified pt   COLONOSCOPY N/A 01/05/2020   Procedure: COLONOSCOPY;  Surgeon: Ruby Corporal, MD;  [facility];  Service: Endoscopy;  Laterality: N/A;  730   COLONOSCOPY WITH PROPOFOL  N/A 12/23/2022   Procedure: COLONOSCOPY WITH PROPOFOL ;  Surgeon: Urban Garden, MD;  [facility];  Service: Gastroenterology;  Laterality: N/A;  8:30am;ASA 3   DILATION AND CURETTAGE OF UTERUS     ORIF HUMERUS FRACTURE Left 02/16/2017   Procedure: OPEN REDUCTION INTERNAL FIXATION (ORIF) LEFT PROXIMAL HUMERUS FRACTURE;  Surgeon: Jasmine Mesi, MD;  Location: [facility]  OR;  Service: Orthopedics;  Laterality: Left;   POLYPECTOMY  01/05/2020   Procedure: POLYPECTOMY;  Surgeon: Ruby Corporal, MD;  [facility];  Service: Endoscopy;;   POLYPECTOMY  12/23/2022   Procedure: POLYPECTOMY;  Surgeon: Umberto Ganong, Bearl Limes, MD;   [facility];  Service: Gastroenterology;;    Family History: Family History  Problem Relation Age of Onset   Stomach cancer Father    Sick sinus syndrome Father    Colon cancer Neg Hx     Social History: Social History   Tobacco Use  Smoking Status Every Day   Current packs/day: 0.75   Average packs/day: 0.8 packs/day for 30.0 years (22.5 ttl pk-yrs)   Types: Cigarettes   Passive exposure: Current  Smokeless Tobacco Never   Social History   Substance and Sexual Activity  Alcohol Use Yes   Alcohol/week: 8.0 standard drinks of alcohol   Types: 8 Cans of beer per week   Comment: 4-5 beers daily    Social History   Substance and Sexual Activity  Drug Use No    Allergies: Allergies  Allergen Reactions   Sulfa Antibiotics     Unsure    Codeine Nausea And Vomiting   Dilaudid  [Hydromorphone  Hcl] Nausea And Vomiting    Zofran  helped    Medications: Current Outpatient Medications  Medication Sig Dispense Refill   Cholecalciferol  50 MCG (2000 UT) CAPS Take 2,000 Units by mouth daily.      Cyanocobalamin  (VITAMIN B-12) 3000 MCG SUBL Place 3,000 mcg under the tongue daily.      diazepam  (VALIUM ) 5 MG tablet Take 5 mg by mouth at bedtime as needed for sedation.     famotidine  (PEPCID ) 20 MG tablet Take 1 tablet (20 mg total) by mouth 2 (two) times daily. 30 tablet 0   losartan  (COZAAR ) 50 MG tablet Take 50 mg by mouth daily.      Polyethyl Glycol-Propyl Glycol (SYSTANE OP) Place 1 drop into both eyes as needed (dry eyes).     Probiotic Product (PROBIOTIC PO) Take 1 capsule by mouth daily.     Vilazodone  HCl (VIIBRYD ) 10 MG TABS Take 1 tablet (10 mg total) by mouth daily. TAKE 1 TABLET BY MOUTH DAILY OR AS DIRECTED BY DOCTOR (Patient taking differently: Take 10 mg by mouth at bedtime. TAKE 1 TABLET BY MOUTH DAILY OR AS DIRECTED BY DOCTOR) 90 tablet 3   vitamin C (ASCORBIC ACID) 250 MG tablet Take 250 mg by mouth daily.     XARELTO  20 MG TABS tablet Take 20 mg by  mouth daily.     No current facility-administered medications for this visit.    Review of Systems: GENERAL: negative for malaise, night sweats HEENT: No changes in hearing or vision, no nose bleeds or other nasal problems. NECK: Negative for lumps, goiter, pain and significant neck swelling RESPIRATORY: Negative for cough, wheezing CARDIOVASCULAR: Negative for chest pain, leg swelling, palpitations, orthopnea GI: SEE HPI MUSCULOSKELETAL: Negative for joint pain or swelling, back pain, and muscle pain. SKIN: Negative for lesions, rash PSYCH: Negative for sleep disturbance, mood disorder and recent psychosocial stressors. HEMATOLOGY Negative for prolonged bleeding, bruising easily, and swollen nodes. ENDOCRINE: Negative for cold or heat intolerance, polyuria, polydipsia and goiter. NEURO: negative for tremor, gait imbalance, syncope and seizures. The remainder of the review of systems is noncontributory.   Physical Exam: BP (!) 148/85 (BP Location: Left Arm, Patient Position: Sitting, Cuff Size: Normal)   Pulse 86   Temp 97.8 F (  36.6 C) (Temporal)   Ht 5\' 4"  (1.626 m)   Wt 115 lb 3.2 oz (52.3 kg)   BMI 19.77 kg/m  GENERAL: The patient is AO x3, in no acute distress. HEENT: Head is normocephalic and atraumatic. EOMI are intact. Mouth is well hydrated and without lesions. NECK: Supple. No masses LUNGS: Clear to auscultation. No presence of rhonchi/wheezing/rales. Adequate chest expansion HEART: RRR, normal s1 and s2. ABDOMEN: Soft, nontender, no guarding, no peritoneal signs, and nondistended. BS +. No masses. RECTAL EXAM: Has presence of an enlarged external hemorrhoid with clot adhered to the surface but without active bleeding, induration is nontender and soft, normal tone, no masses, brown stool without blood. Chaperone:Crystal Ethyl Hering, CMA EXTREMITIES: Without any cyanosis, clubbing, rash, lesions or edema. NEUROLOGIC: AOx3, no focal motor deficit. SKIN: no jaundice, no  rashes  Imaging/Labs: as above  I personally reviewed and interpreted the available labs, imaging and endoscopic files.  Impression and Plan: [name] is a 68 y.o. female with past medical history of osteoarthritis, GERD, hiatal hernia, hypertension, multiple sclerosis and seizures,  who presents for follow up of pancreatic lesion and evaluation of hemorrhoids.  Patient presented acute onset of rectal bleeding recently.  She has had previous recent endoscopic evaluation which did not show any masses.  The physical exam today, she presents presence of external hemorrhoids which are engorged and with adhered clot. It was not indurated, which makes unlikely a thrombosed hemorrhoid. Patient will benefit from medical management with topical therapy. Emergency recommendations were provided. Should also take Miralax to keep stools soft to watery.  In terms of her pancreatic tail lesion, this has slowly increased in size. Has been asymptomatic from this. As there is a possibility of a  mucinous etiology of the lesion given the patient's sex and location of the lesion, we will refer her for evaluation with an endoscopic ultrasound.  Ultimately, if this is confirmed to be a mucinous cyst or if it cannot be completely evaluated with a endoscopic ultrasound, we will need to refer her case to a hepatobiliary surgeon at [facility] surgery.  - Use topical Anusol cream for 10 days - Try to keep stools soft - Start taking Miralax 1 capful every day for one week. If bowel movements do not improve, increase to 2 capfuls every day. If after two weeks there is no improvement, increase to 2 capfuls in AM and one at night. - Do no strain to move bowels - Use bath sitz regularly  - If presenting persistent severe bleeding or pain in the rectal area, should hold anticoagulant and go to the ER for further evaluation - Referrral for EUS for evaluation of pancreatic cyst.  All questions were answered.      [name], MD Gastroenterology and Hepatology [facility] Gastroenterology

## 2023-10-19 NOTE — Patient Instructions (Addendum)
-   Use topical Anusol cream for 10 days - Try to keep stools soft - Start taking Miralax 1 capful every day for one week. If bowel movements do not improve, increase to 2 capfuls every day. If after two weeks there is no improvement, increase to 2 capfuls in AM and one at night. - Do no strain to move bowels - Use bath sitz regularly - please buy this at [facility] or [facility] - If presenting persistent severe bleeding or pain in the rectal area, please hold anticoagulant and go to the ER for further evaluation - Referral for EUS

## 2023-10-22 ENCOUNTER — Other Ambulatory Visit (INDEPENDENT_AMBULATORY_CARE_PROVIDER_SITE_OTHER): Payer: Self-pay

## 2023-10-22 ENCOUNTER — Other Ambulatory Visit: Payer: Self-pay

## 2023-10-22 ENCOUNTER — Telehealth: Payer: Self-pay

## 2023-10-22 ENCOUNTER — Telehealth (INDEPENDENT_AMBULATORY_CARE_PROVIDER_SITE_OTHER): Payer: Self-pay

## 2023-10-22 DIAGNOSIS — G35 Multiple sclerosis: Secondary | ICD-10-CM | POA: Diagnosis not present

## 2023-10-22 DIAGNOSIS — K869 Disease of pancreas, unspecified: Secondary | ICD-10-CM | POA: Diagnosis not present

## 2023-10-22 DIAGNOSIS — E871 Hypo-osmolality and hyponatremia: Secondary | ICD-10-CM

## 2023-10-22 DIAGNOSIS — K644 Residual hemorrhoidal skin tags: Secondary | ICD-10-CM | POA: Diagnosis not present

## 2023-10-22 DIAGNOSIS — R1032 Left lower quadrant pain: Secondary | ICD-10-CM | POA: Diagnosis not present

## 2023-10-22 NOTE — Telephone Encounter (Signed)
 EUS has been set up for 12/28/23 at [facility] with GM at 11 am Anti coag hold request sent to PCP    EUS scheduled, pt instructed and medications reviewed.  Patient instructions mailed to home.  Patient to call with any questions or concerns.

## 2023-10-22 NOTE — Telephone Encounter (Signed)
 I spoke with the patient and made her aware ot have her labs drawn at [facility]. Patient states understanding and will have her labs drawn.    Mansouraty, Albino Alu., MD  Urban Garden, MD; Aneita Keens, RN; Jaunita Messier, Yessenia Maillet L, CMA Sounds good.  Patty, Schedule next available [place] EUS for patient.  We'll get update about the CA19-9 when it does return. Thanks. GM       Previous Messages    ----- Message ----- From: Urban Garden, MD Sent: 10/22/2023   7:56 AM EDT To: Aneita Keens, RN; Vallie Gay; * Subject: RE:                                            [place], Yes, that's our idea, trying to get some sampling from it if possible to rule out if mucinous in nature, especially given the location of it . I review the CT and if appears the cyst is adjacent to the posterior wall of the stomach. The patient is interested in pursuing the EUS, so please let's go ahead and schedule her.  Robbi Spells, please send her an order for CA 19-9.  Thanks, ----- Message ----- From: Normie Becton., MD Sent: 10/22/2023   5:36 AM EDT To: Aneita Keens, RN; Vallie Gay; *  DCM and AT, I can move forward with an EUS and hope to get enough fluid for sampling (it is small still at 2 cm rather than normal 3 cm mark), but with young age, understand wanting to try and be aggressive to see if [facility] or not.  If that is the strategy you all would like, then reply back and we will move forward with scheduling.  If patient is hesitant could pursue 6-12 month MRI/MRCP.  I would recommend in either case that a CA19-9 be drawn to have a baseline as well.  Reply back. Thanks. GM  Patty, Let's hear back from the [place] team and we can schedule this patient for EUS as able if still in agreement. GM ----- Message ----- From: Aneita Keens, RN Sent: 10/21/2023   4:12 PM EDT To: Daune Eric., MD   ----- Message ----- From: Vallie Gay Sent: 10/21/2023   3:11 PM EDT To: Aneita Keens, RN  Per Dr Sammi Crick patient needs EUS with Dr Brice Campi - Dx: Enlarging pancreatic tail cyst - thank, Lorelee Roger

## 2023-10-22 NOTE — Telephone Encounter (Signed)
-----   Message from [place] [facility] sent at 10/22/2023  8:56 AM EDT ----- Regarding: RE:  Sounds good.  Sivan Cuello, Schedule next available Upper EUS for patient.  We'll get update about the CA19-9 when it does return. Thanks. GM ----- Message ----- From: Urban Garden, MD Sent: 10/22/2023   7:56 AM EDT To: Aneita Keens, RN; Vallie Gay; # Subject: RE:                                            [place], Yes, that's our idea, trying to get some sampling from it if possible to rule out if mucinous in nature, especially given the location of it . I review the CT and if appears the cyst is adjacent to the posterior wall of the stomach.  The patient is interested in pursuing the EUS, so please let's go ahead and schedule her.  Crystal, please send her an order for CA 19-9.  Thanks, ----- Message ----- From: Normie Becton., MD Sent: 10/22/2023   5:36 AM EDT To: Aneita Keens, RN; Vallie Gay; #  DCM and AT, I can move forward with an EUS and hope to get enough fluid for sampling (it is small still at 2 cm rather than normal 3 cm mark), but with young age, understand wanting to try and be aggressive to see if [facility] or not.  If that is the strategy you all would like, then reply back and we will move forward with scheduling.  If patient is hesitant could pursue 6-12 month MRI/MRCP.  I would recommend in either case that a CA19-9 be drawn to have a baseline as well.  Reply back. Thanks. GM  Theda Payer, Let's hear back from the [place] team and we can schedule this patient for EUS as able if still in agreement. GM ----- Message ----- From: Aneita Keens, RN Sent: 10/21/2023   4:12 PM EDT To: Daune Eric., MD   ----- Message ----- From: Vallie Gay Sent: 10/21/2023   3:11 PM EDT To: Aneita Keens, RN  Per Dr Sammi Crick patient needs EUS with Dr Brice Campi - Dx: Enlarging pancreatic tail cyst - thank, Lorelee Roger

## 2023-10-22 NOTE — Telephone Encounter (Signed)
 Thanks

## 2023-10-22 NOTE — Telephone Encounter (Signed)
 Urban Garden, MD  Aneita Keens, RN; Vallie Gay Mansouraty, Albino Alu., MD; Clemens Curt, CMA [place], Yes, that's our idea, trying to get some sampling from it if possible to rule out if mucinous in nature, especially given the location of it . I review the CT and if appears the cyst is adjacent to the posterior wall of the stomach. The patient is interested in pursuing the EUS, so please let's go ahead and schedule her.  Crystal, please send her an order for CA 19-9.  Thanks,

## 2023-10-23 LAB — CANCER ANTIGEN 19-9: CA 19-9: 30 U/mL (ref 0–35)

## 2023-10-23 NOTE — Telephone Encounter (Signed)
 Patient requesting hospital procedure for a thursday or Friday before July 7th if possible. Please advise.   Thank  you

## 2023-10-26 NOTE — Telephone Encounter (Signed)
 PT is calling for an update to see if she can reschedule EUS for a Thursday or Friday. Her care partner will not be available for 7/7. Please advise.

## 2023-10-26 NOTE — Telephone Encounter (Signed)
 Spoke with the patient. She needs her sister as her care partner. She will have to move her procedure to a Thursday or A Friday to accommodate her schedule. She is okay with it being after 12/28/23.

## 2023-10-27 ENCOUNTER — Telehealth (INDEPENDENT_AMBULATORY_CARE_PROVIDER_SITE_OTHER): Payer: Self-pay

## 2023-10-27 ENCOUNTER — Encounter (INDEPENDENT_AMBULATORY_CARE_PROVIDER_SITE_OTHER): Payer: Self-pay

## 2023-10-27 NOTE — Telephone Encounter (Signed)
 Patient called today to see if she needs to go back to taking the metamucil?  Patient says she has taken Miralax once per day for a week as instructed, with the last dose being yesterday. She says the once per day has cleaned her out and she has some looser stools. She would like to know if she should go back to taking her metamucil once per day as she had been doing?  She says she is still having some "spotting" of bright red blood per rectum from the hemorrhoid. She has been using the anusol  and using a sitz bath two to three times per day. She wants to know if this does not getting any better should she come in to the office next week to have it evaluated? She says you had mentioned that the bleeding could continue for several weeks. Please advise. Thanks,

## 2023-10-27 NOTE — Telephone Encounter (Signed)
 She is to continue with the MiraLAX until the bleeding resolves.  She needs to keep her stools very soft as she will need to avoid straining at all cost.  Metamucil may make the stools more formed, which will not help.  It is okay to see some spotting occasionally but I hope this is improving.  She can make a follow-up appointment or earlier if she wants the area to be checked but this may take several weeks to completely resolve.

## 2023-10-27 NOTE — Telephone Encounter (Signed)
 I called and left a message on patient My Chart and made her aware to check her My Chart for further recommendations.

## 2023-10-29 NOTE — Telephone Encounter (Signed)
 The pt has been moved to 01/07/24 at 315 pm at [facility] with GM   EUS scheduled, pt instructed and medications reviewed.  Patient instructions mailed to home.  Patient to call with any questions or concerns.

## 2023-11-02 ENCOUNTER — Telehealth (INDEPENDENT_AMBULATORY_CARE_PROVIDER_SITE_OTHER): Payer: Self-pay

## 2023-11-02 MED ORDER — METRONIDAZOLE 500 MG PO TABS: 500.0000 mg | ORAL_TABLET | Freq: Three times a day (TID) | ORAL | 0 refills | Status: DC

## 2023-11-02 MED ORDER — CIPROFLOXACIN HCL 500 MG PO TABS: 500.0000 mg | ORAL_TABLET | Freq: Two times a day (BID) | ORAL | 0 refills | Status: DC

## 2023-11-02 NOTE — Telephone Encounter (Signed)
 Patient called today with complaints of what she says is a diverticulitis flare. She is having some left lower abdominal pain, and would like to have Cipro and flagyl called into [facility] on [address]. Patient denies any fever, nausea, vomiting, no change is bowel habits. She reports she has had diverticulitis some much in the past that she knows she is having a flare. She is having some bloating,and says her hemorrhoid is better, as she is taking miralax as instructed by Dr. Sammi Crick. Also she wanted to know if having a flare should she continue with the miralax. She says she can not see her pcp as he had a family emergency and did not want to go to the ed, if she could help it. Please advise.

## 2023-11-02 NOTE — Telephone Encounter (Signed)
 I have sent in Cipro and Flagyl for very brief course. If any worsening, please call us .

## 2023-11-02 NOTE — Telephone Encounter (Signed)
 I spoke with the patient and made her aware that Antony Baumgartner, may reach out to her after five/ office closing. I advised that if her symptoms worsen, to please go to the ED. Patient states understanding.

## 2023-11-05 MED ORDER — METRONIDAZOLE 500 MG PO TABS: 500.0000 mg | ORAL_TABLET | Freq: Three times a day (TID) | ORAL | 0 refills | Status: AC

## 2023-11-05 MED ORDER — CIPROFLOXACIN HCL 500 MG PO TABS: 500.0000 mg | ORAL_TABLET | Freq: Two times a day (BID) | ORAL | 0 refills | Status: AC

## 2023-11-05 NOTE — Telephone Encounter (Signed)
 I sent in additional 5 days. Take Miralax twice a day if needed to keep stool soft. If she is not improving, she needs to go to the ED or call our office if it is during work hours.

## 2023-11-05 NOTE — Addendum Note (Signed)
 Addended by: Delman Ferns on: 11/05/2023 01:41 PM   Modules accepted: Orders

## 2023-11-05 NOTE — Telephone Encounter (Signed)
 I spoke with the patient and made her aware per Antony Baumgartner,  I sent in additional 5 days. Take Miralax twice a day if needed to keep stool soft. If she is not improving, she needs to go to the ED or call our office if it is during work hours.   Patient states understanding.

## 2023-11-05 NOTE — Telephone Encounter (Signed)
 Patient has called back today saying she she feels she is getting better, but wants to know if you can send in five more days worth of the Cipro and Flagyl. She says it usually takes her body seven to ten days of treatment to overcome a flare. She says today is the first day she feels it is starting to work. Abdominal pain is better, but she still has some abdominal swelling. She says she has still been taking Miralax daily as we have asked and her stools are still not formed, and are dark and sink to the bottom of toilet. Denies any sight of bright red blood in stools. She is eating a soft bland diet and avoiding meats. She says she started taking the antibiotics late on Monday night at which time she took two doses. She says she is scared with the weekend coming up, that she may not get any better without the extra five days of treatment, and would like for you to please send in another five days of both the Cipro and the Flagyl to [address]. Please advise.

## 2023-11-11 DIAGNOSIS — H04123 Dry eye syndrome of bilateral lacrimal glands: Secondary | ICD-10-CM | POA: Diagnosis not present

## 2023-11-12 ENCOUNTER — Other Ambulatory Visit (HOSPITAL_COMMUNITY): Payer: Self-pay | Admitting: Family Medicine

## 2023-11-12 DIAGNOSIS — Z1231 Encounter for screening mammogram for malignant neoplasm of breast: Secondary | ICD-10-CM

## 2023-11-19 ENCOUNTER — Telehealth (INDEPENDENT_AMBULATORY_CARE_PROVIDER_SITE_OTHER): Payer: Self-pay | Admitting: Gastroenterology

## 2023-11-19 NOTE — Telephone Encounter (Signed)
 [facility] PA: Pending: In clinical review Tracking [id number]

## 2023-11-19 NOTE — Telephone Encounter (Signed)
 Pt contacted. Pt states she has not been taking Miralax in the past 4 days. Pt advised that CT was in review. Pt verbalized understanding.

## 2023-11-19 NOTE — Telephone Encounter (Signed)
 Pt left voicemail stating she was still having issues with diverticulitis. No solid BM in 3 weeks. BM is dark but not black. Swelling and pain on left side that comes and goes. Pt finished antibiotic last Thursday. Pt does have appt on Monday. Please advise. Thank you  (Pt last seen 10/19/23)

## 2023-11-19 NOTE — Telephone Encounter (Signed)
 It would be unusual for the abdominal pain to persist after receiving treatment for presumed diverticulitis.  Tanya, Please ask her if she is still taking the MiraLAX as this could be making her stools watery.  If her hemorrhoids are much better, she can cut down on the MiraLAX.  Tammy, can you please schedule a CT of the abdomen and pelvis with IV contrast ASAP? Dx: Abdominal pain and changes in her bowel habits.   Thanks,  [name], MD Gastroenterology and Hepatology [facility] Gastroenterology

## 2023-11-23 ENCOUNTER — Encounter (INDEPENDENT_AMBULATORY_CARE_PROVIDER_SITE_OTHER): Payer: Self-pay | Admitting: Gastroenterology

## 2023-11-23 ENCOUNTER — Ambulatory Visit (INDEPENDENT_AMBULATORY_CARE_PROVIDER_SITE_OTHER): Admitting: Gastroenterology

## 2023-11-23 ENCOUNTER — Other Ambulatory Visit (INDEPENDENT_AMBULATORY_CARE_PROVIDER_SITE_OTHER): Payer: Self-pay | Admitting: *Deleted

## 2023-11-23 VITALS — BP 132/77 | HR 76 | Temp 97.5°F | Ht 64.0 in | Wt 110.0 lb

## 2023-11-23 DIAGNOSIS — Z8719 Personal history of other diseases of the digestive system: Secondary | ICD-10-CM

## 2023-11-23 DIAGNOSIS — R194 Change in bowel habit: Secondary | ICD-10-CM

## 2023-11-23 DIAGNOSIS — R109 Unspecified abdominal pain: Secondary | ICD-10-CM

## 2023-11-23 DIAGNOSIS — K869 Disease of pancreas, unspecified: Secondary | ICD-10-CM | POA: Diagnosis not present

## 2023-11-23 DIAGNOSIS — K5732 Diverticulitis of large intestine without perforation or abscess without bleeding: Secondary | ICD-10-CM

## 2023-11-23 DIAGNOSIS — K5900 Constipation, unspecified: Secondary | ICD-10-CM | POA: Diagnosis not present

## 2023-11-23 DIAGNOSIS — K649 Unspecified hemorrhoids: Secondary | ICD-10-CM | POA: Diagnosis not present

## 2023-11-23 NOTE — Telephone Encounter (Signed)
 Pt seen in office today and says she is doing better. She does not want to do the CT scan

## 2023-11-23 NOTE — Telephone Encounter (Signed)
 [facility] PA: Approved Authorization #[id number]  Tracking #[id number] Dates of service 11/19/2023 - 01/18/2024

## 2023-11-23 NOTE — Progress Notes (Signed)
 [name], M.D. Gastroenterology & Hepatology [facility]enter[facility] [address] [place], [place] [address]  Primary Care Physician: Joan Amel, MD [address] [place] [address]  I will communicate my assessment and recommendations to the referring MD via EMR.  Problems: Symptomatic external hemorrhoids History of recurrent diverticulitis Pancreatic lesion   History of Present Illness: [name] is a 68 y.o. female with past medical history of osteoarthritis, GERD, hiatal hernia, hypertension, recurrent diverticulitis, multiple sclerosis and seizures,  who presents for follow up of pancreatic lesion and hemorrhoids.  The patient was last seen on 10/19/2023. At that time, the patient was advised to take Anusol  cream for 10 days and to take MiraLAX on a regular basis.  Patient had new onset worsening abdominal pain on 11/02/2023 and was concerned about diverticulitis. She reports that she ate some cucumber and it may have flared since then.  She was sent a prescription for ciprofloxacin  and Flagyl  for 5 days. She had extension of her antibiotic for 5 more days. Despite taking the medication she was still complaining of some pain and is scheduled to undergo a CT of the abdomen pelvis with IV contrast tomorrow. Currently reports that her abdominal pain has resolved.  The patient denies having any nausea, vomiting, fever, chills, hematochezia, melena, hematemesis, abdominal distention, diarrhea, jaundice, pruritus . Has lost 5 lb since symptoms started.  Noticed having some straining recently as she was advised to stop Miralax while she was having diverticulitis flare.  Due to this, she presented recurrent constipation but restarted taking MiraLAX yesterday.  Notably, patient is scheduled to proceed with EGD with EUS on 01/07/2024.  Last EGD: never Last Colonoscopy: 12/23/2022 - Three 3 to 6 mm polyps in the transverse colon, removed  with a cold snare. Resected and retrieved. - Diverticulosis in the sigmoid colon and in the descending colon. - Non- bleeding internal hemorrhoids.   Polyps were tubular adenomas. Repeat colonoscopy in 5 years   Past Medical History: Past Medical History:  Diagnosis Date   Arthritis    Osteoarthritis   Colon polyps    GERD (gastroesophageal reflux disease)    Hiatal hernia    Hypertension    MS (multiple sclerosis) (HCC) 12/09/2013   Multiple sclerosis (HCC)    Seizures (HCC) 01/04/2005   1 and Only    Past Surgical History: Past Surgical History:  Procedure Laterality Date   COLONOSCOPY     COLONOSCOPY N/A 09/16/2012   Procedure: COLONOSCOPY;  Surgeon: Joan Corporal, MD;  [facility];  Service: Endoscopy;  Laterality: N/A;  1030-rescheduled to 9:30 Ann notified pt   COLONOSCOPY N/A 01/05/2020   Procedure: COLONOSCOPY;  Surgeon: Joan Corporal, MD;  [facility];  Service: Endoscopy;  Laterality: N/A;  730   COLONOSCOPY WITH PROPOFOL  N/A 12/23/2022   Procedure: COLONOSCOPY WITH PROPOFOL ;  Surgeon: Joan Garden, MD;  [facility];  Service: Gastroenterology;  Laterality: N/A;  8:30am;ASA 3   DILATION AND CURETTAGE OF UTERUS     ORIF HUMERUS FRACTURE Left 02/16/2017   Procedure: OPEN REDUCTION INTERNAL FIXATION (ORIF) LEFT PROXIMAL HUMERUS FRACTURE;  Surgeon: Joan Mesi, MD;  Location: [facility];  Service: Orthopedics;  Laterality: Left;   POLYPECTOMY  01/05/2020   Procedure: POLYPECTOMY;  Surgeon: Joan Corporal, MD;  [facility];  Service: Endoscopy;;   POLYPECTOMY  12/23/2022   Procedure: POLYPECTOMY;  Surgeon: Joan Garden, MD;  [facility];  Service: Gastroenterology;;    Family History: Family History  Problem Relation Age of Onset   Stomach cancer Father    Sick sinus syndrome Father    Colon cancer Neg Hx     Social History: Social History   Tobacco Use  Smoking Status Every  Day   Current packs/day: 0.75   Average packs/day: 0.8 packs/day for 30.0 years (22.5 ttl pk-yrs)   Types: Cigarettes   Passive exposure: Current  Smokeless Tobacco Never   Social History   Substance and Sexual Activity  Alcohol Use Yes   Alcohol/week: 8.0 standard drinks of alcohol   Types: 8 Cans of beer per week   Comment: 4-5 beers daily    Social History   Substance and Sexual Activity  Drug Use No    Allergies: Allergies  Allergen Reactions   Sulfa Antibiotics     Unsure    Codeine Nausea And Vomiting   Dilaudid  [Hydromorphone  Hcl] Nausea And Vomiting    Zofran  helped    Medications: Current Outpatient Medications  Medication Sig Dispense Refill   Cholecalciferol  50 MCG (2000 UT) CAPS Take 2,000 Units by mouth daily.      Cyanocobalamin  (VITAMIN B-12) 3000 MCG SUBL Place 3,000 mcg under the tongue daily.      diazepam  (VALIUM ) 5 MG tablet Take 5 mg by mouth at bedtime as needed for sedation.     famotidine  (PEPCID ) 20 MG tablet Take 1 tablet (20 mg total) by mouth 2 (two) times daily. 30 tablet 0   hydrocortisone  (ANUSOL -HC) 2.5 % rectal cream Place 1 Application rectally 2 (two) times daily. 30 g 0   losartan  (COZAAR ) 50 MG tablet Take 50 mg by mouth daily.      Polyethyl Glycol-Propyl Glycol (SYSTANE OP) Place 1 drop into both eyes as needed (dry eyes).     Probiotic Product (PROBIOTIC PO) Take 1 capsule by mouth daily.     Vilazodone  HCl (VIIBRYD ) 10 MG TABS Take 1 tablet (10 mg total) by mouth daily. TAKE 1 TABLET BY MOUTH DAILY OR AS DIRECTED BY DOCTOR (Patient taking differently: Take 10 mg by mouth at bedtime. TAKE 1 TABLET BY MOUTH DAILY OR AS DIRECTED BY DOCTOR) 90 tablet 3   vitamin C (ASCORBIC ACID) 250 MG tablet Take 250 mg by mouth daily.     XARELTO  20 MG TABS tablet Take 20 mg by mouth daily.     No current facility-administered medications for this visit.    Review of Systems: GENERAL: negative for malaise, night sweats HEENT: No changes in  hearing or vision, no nose bleeds or other nasal problems. NECK: Negative for lumps, goiter, pain and significant neck swelling RESPIRATORY: Negative for cough, wheezing CARDIOVASCULAR: Negative for chest pain, leg swelling, palpitations, orthopnea GI: SEE HPI MUSCULOSKELETAL: Negative for joint pain or swelling, back pain, and muscle pain. SKIN: Negative for lesions, rash PSYCH: Negative for sleep disturbance, mood disorder and recent psychosocial stressors. HEMATOLOGY Negative for prolonged bleeding, bruising easily, and swollen nodes. ENDOCRINE: Negative for cold or heat intolerance, polyuria, polydipsia and goiter. NEURO: negative for tremor, gait imbalance, syncope and seizures. The remainder of the review of systems is noncontributory.   Physical Exam: BP 132/77 (BP Location: Left Arm, Patient Position: Sitting, Cuff Size: Normal)   Pulse 76   Temp (!) 97.5 F (36.4 C) (Temporal)   Ht 5\' 4"  (1.626 m)   Wt 110 lb (49.9 kg)   BMI 18.88 kg/m  GENERAL: The patient is AO x3, in no acute  distress. HEENT: Head is normocephalic and atraumatic. EOMI are intact. Mouth is well hydrated and without lesions. NECK: Supple. No masses LUNGS: Clear to auscultation. No presence of rhonchi/wheezing/rales. Adequate chest expansion HEART: RRR, normal s1 and s2. ABDOMEN: Soft, nontender, no guarding, no peritoneal signs, and nondistended. BS +. No masses. RECTAL EXAM: very small external hemorrhoid, normal tone, no masses, brown stool without blood. Chaperone: Umberto Ganong, CMA EXTREMITIES: Without any cyanosis, clubbing, rash, lesions or edema. NEUROLOGIC: AOx3, no focal motor deficit. SKIN: no jaundice, no rashes  Imaging/Labs: as above  I personally reviewed and interpreted the available labs, imaging and endoscopic files.  Impression and Plan: TANDREA KOMMER is a 68 y.o. female with past medical history of osteoarthritis, GERD, hiatal hernia, hypertension, recurrent diverticulitis,  multiple sclerosis and seizures,  who presents for follow up of pancreatic lesion and hemorrhoids.  Patient has presented resolution of her rectal bleeding from hemorrhoids after implementing topical maneuvers and utilizing stool softeners.  However, she unfortunately developed a new episode of diverticulitis that responded to antibiotic treatment for 10 days.  Her abdominal pain has completely resolved.  We can hold off on further imaging studies at the moment, but given the recurrence of constipation, I advised her to restart her MiraLAX on a regular basis to avoid straining and recurrence of symptomatic hemorrhoids.  Finally, she is due for endoscopic ultrasound with Dr. Brice Campi for further evaluation of her pancreatic lesion in July which she should proceed with.  -Continue Miralax daily -Follow-up with Dr. Brice Campi for endoscopic ultrasound in July -Cancel CT abdomen  All questions were answered.      Joan Cress, MD Gastroenterology and Hepatology [facility]

## 2023-11-23 NOTE — Patient Instructions (Addendum)
 Continue Miralax daily Follow-up with Dr. Brice Campi for endoscopic ultrasound in July Cancel CT abdomen

## 2023-11-24 ENCOUNTER — Ambulatory Visit (HOSPITAL_COMMUNITY)

## 2023-11-25 ENCOUNTER — Ambulatory Visit (HOSPITAL_COMMUNITY)
Admission: RE | Admit: 2023-11-25 | Discharge: 2023-11-25 | Disposition: A | Discharge status: Home / Self Care | Payer: Self-pay | Source: Ambulatory Visit | Attending: Family Medicine | Admitting: Family Medicine

## 2023-11-25 ENCOUNTER — Encounter (HOSPITAL_COMMUNITY): Payer: Self-pay

## 2023-11-25 DIAGNOSIS — Z1231 Encounter for screening mammogram for malignant neoplasm of breast: Secondary | ICD-10-CM | POA: Diagnosis not present

## 2023-12-01 ENCOUNTER — Telehealth: Payer: Self-pay | Admitting: Neurology

## 2023-12-01 MED ORDER — VILAZODONE HCL 10 MG PO TABS: 10.0000 mg | ORAL_TABLET | Freq: Every day | ORAL | 3 refills | Status: AC

## 2023-12-01 NOTE — Telephone Encounter (Signed)
 Patient request refill for Vilazodone  HCl (VIIBRYD ) 10 MG TABS send to  [facility] [phone]

## 2023-12-01 NOTE — Telephone Encounter (Signed)
Refill sent for the pt

## 2023-12-07 ENCOUNTER — Ambulatory Visit

## 2023-12-07 DIAGNOSIS — Z681 Body mass index (BMI) 19 or less, adult: Secondary | ICD-10-CM | POA: Diagnosis not present

## 2023-12-07 DIAGNOSIS — L237 Allergic contact dermatitis due to plants, except food: Secondary | ICD-10-CM | POA: Diagnosis not present

## 2023-12-30 ENCOUNTER — Telehealth: Payer: Self-pay | Admitting: Gastroenterology

## 2023-12-30 ENCOUNTER — Telehealth: Payer: Self-pay

## 2023-12-30 NOTE — Telephone Encounter (Signed)
 Procedure:EUS Procedure date: 01/07/24 Procedure location: [facility] Arrival Time: 1:45 pm Spoke with the patient Y/N: Yes Any prep concerns? No  Has the patient obtained the prep from the pharmacy ? No prep needed Do you have a care partner and transportation: Yes Any additional concerns? Pt states she has been talking to [facility] about blood thinner clearance and having to cancel/reschedule due to not being able to stop medication.

## 2023-12-30 NOTE — Telephone Encounter (Signed)
 Inbound call from patient requesting a call back to discuss holding blood thinner further. States she has other concerns she would like to discuss. Please advise, thank you.

## 2023-12-30 NOTE — Telephone Encounter (Signed)
-----   Message from Nurse Jashanti Clinkscale P sent at 12/09/2023  8:18 AM EDT -----  ----- Message ----- From: Anitra Odetta CROME, RN Sent: 12/09/2023  12:00 AM EDT To: Odetta CROME Anitra, RN  Make sure to have xarelto  hold from PCP

## 2023-12-30 NOTE — Telephone Encounter (Signed)
 The pt has not heard from PCP in reagards to Xarelto . I have not gotten a response.  I have resent the letter and the pt will also call the office to get a response.

## 2023-12-30 NOTE — Telephone Encounter (Signed)
 I spoke to Dr Bertha office and his nurse states that Dr Marvine prefers that the pt NOT hold xarelto  for 01/07/24 EUS     Please advise

## 2023-12-30 NOTE — Telephone Encounter (Signed)
 Patty, If this is the case, then I can only perform diagnostic evaluation of the lesion and I cannot sample or take fluid out if possible of the pancreatic cyst. This will make the procedure less helpful overall. I really only need the Xarelto  hold for 1 day prior and I will restart as quickly as possible depending on what is done within 0-2 days. I can try to reach out to Dr. Marvine at some point as well, if that helps them understand role of holding (after you discuss this further with his RN and his team). If however, the MD does not give permission, then I would keep the patient on the list for now and we will update the patient's primary GI as well to this.  DCM, If we cannot get approval to hold Xarelto , then I can only perform EUS without sampling ability. Not sure that makes great sense to do but can do. If this is the case, then maybe just MRI/MRCP in 3-6 months to see stability, until she can come off Xarelto . GM

## 2023-12-30 NOTE — Telephone Encounter (Signed)
 Thanks for the update [place]. This is rather unfortunate. If you can reach him and ask for clearance to hold, it would be great (I just called the office and they are closed for the day).  If not possible, I think we can hold off on the EUS for now until she can come off the Xarelto  - agree with the repeat imaging plan in that case. Thanks,  Toribio

## 2023-12-31 ENCOUNTER — Encounter (HOSPITAL_COMMUNITY): Payer: Self-pay | Admitting: Gastroenterology

## 2024-01-01 NOTE — Telephone Encounter (Signed)
 Thanks for the update

## 2024-01-01 NOTE — Telephone Encounter (Signed)
 The pt has been advised that we are awaiting a response from PCP on Monday.

## 2024-01-01 NOTE — Telephone Encounter (Signed)
[phone] 

## 2024-01-01 NOTE — Telephone Encounter (Signed)
 I called and spoke with Dr. Bertha PA. Described clinical history and indications for EUS and potential sampling. She is going to review chart and discuss with Dr. Marvine as necessary (he is out for the week until next week). They are aware that we could hold Xarelto  for day prior to procedure and within 0-2 days be restarted based on what intervention or aspiration does occur during EUS. If they feel that patient needs to be bridged with Lovenox then she could get Lovenox up until the day of her procedure and then I could restart Lovenox within 12-24 hours of the procedure if an intervention is performed. If still cannot come off anticoagulation then will need to see if Dr. Eartha still wants EUS or short interval imaging as described previously. Her procedure is not until Thursday, so we will give them PCP team up until Tuesday AM to make decision as to what we can do with anticoagulation.  FYI Patty and DCM.

## 2024-01-01 NOTE — Telephone Encounter (Signed)
 Dr Wilhelmenia have you been able to speak with Dr Marvine regarding this Xarelto  hold?

## 2024-01-01 NOTE — Telephone Encounter (Signed)
 Patty, Can you send me the office number for Dr. Marvine? Thanks.

## 2024-01-04 DIAGNOSIS — Z79899 Other long term (current) drug therapy: Secondary | ICD-10-CM | POA: Diagnosis not present

## 2024-01-04 DIAGNOSIS — Z681 Body mass index (BMI) 19 or less, adult: Secondary | ICD-10-CM | POA: Diagnosis not present

## 2024-01-04 DIAGNOSIS — G35 Multiple sclerosis: Secondary | ICD-10-CM | POA: Diagnosis not present

## 2024-01-04 NOTE — Telephone Encounter (Signed)
 Can you let me know if you see this fax?

## 2024-01-04 NOTE — Telephone Encounter (Signed)
 Inbound call from patient stating clearance has been faxed this morning. Requesting a call back to confirm it was received. Please advise, thank you.

## 2024-01-05 NOTE — Telephone Encounter (Signed)
 Spoke with the pt and she is going to upload a copy of the ok to hold Xarelto  to her My Chart. She will keep appt as planned.

## 2024-01-07 ENCOUNTER — Encounter (HOSPITAL_COMMUNITY): Payer: Self-pay | Admitting: Gastroenterology

## 2024-01-07 ENCOUNTER — Ambulatory Visit (HOSPITAL_COMMUNITY): Admitting: Anesthesiology

## 2024-01-07 ENCOUNTER — Encounter (HOSPITAL_COMMUNITY)
Admission: RE | Disposition: A | Discharge status: Home / Self Care | Payer: Self-pay | Source: Home / Self Care | Attending: Gastroenterology

## 2024-01-07 ENCOUNTER — Ambulatory Visit (HOSPITAL_COMMUNITY)
Admission: RE | Admit: 2024-01-07 | Discharge: 2024-01-07 | Disposition: A | Discharge status: Home / Self Care | Attending: Gastroenterology | Admitting: Gastroenterology

## 2024-01-07 DIAGNOSIS — I10 Essential (primary) hypertension: Secondary | ICD-10-CM

## 2024-01-07 DIAGNOSIS — K2950 Unspecified chronic gastritis without bleeding: Secondary | ICD-10-CM

## 2024-01-07 DIAGNOSIS — K3189 Other diseases of stomach and duodenum: Secondary | ICD-10-CM

## 2024-01-07 DIAGNOSIS — Z79899 Other long term (current) drug therapy: Secondary | ICD-10-CM | POA: Insufficient documentation

## 2024-01-07 DIAGNOSIS — K2990 Gastroduodenitis, unspecified, without bleeding: Secondary | ICD-10-CM | POA: Diagnosis not present

## 2024-01-07 DIAGNOSIS — I899 Noninfective disorder of lymphatic vessels and lymph nodes, unspecified: Secondary | ICD-10-CM

## 2024-01-07 DIAGNOSIS — K2289 Other specified disease of esophagus: Secondary | ICD-10-CM

## 2024-01-07 DIAGNOSIS — K869 Disease of pancreas, unspecified: Secondary | ICD-10-CM

## 2024-01-07 DIAGNOSIS — K862 Cyst of pancreas: Secondary | ICD-10-CM

## 2024-01-07 DIAGNOSIS — K2970 Gastritis, unspecified, without bleeding: Secondary | ICD-10-CM

## 2024-01-07 DIAGNOSIS — K2980 Duodenitis without bleeding: Secondary | ICD-10-CM | POA: Diagnosis not present

## 2024-01-07 DIAGNOSIS — F17210 Nicotine dependence, cigarettes, uncomplicated: Secondary | ICD-10-CM | POA: Diagnosis not present

## 2024-01-07 DIAGNOSIS — K449 Diaphragmatic hernia without obstruction or gangrene: Secondary | ICD-10-CM | POA: Diagnosis not present

## 2024-01-07 HISTORY — PX: ESOPHAGOGASTRODUODENOSCOPY: SHX5428

## 2024-01-07 HISTORY — PX: FINE NEEDLE ASPIRATION BIOPSY: CATH118315

## 2024-01-07 HISTORY — PX: EUS: SHX5427

## 2024-01-07 SURGERY — ULTRASOUND, UPPER GI TRACT, ENDOSCOPIC
Anesthesia: Monitor Anesthesia Care

## 2024-01-07 MED ORDER — PROPOFOL 500 MG/50ML IV EMUL
INTRAVENOUS | Status: DC | PRN
Start: 2024-01-07 — End: 2024-01-07
  Administered 2024-01-07: 20 mg via INTRAVENOUS
  Administered 2024-01-07: 50 mg via INTRAVENOUS
  Administered 2024-01-07: 150 ug/kg/min via INTRAVENOUS
  Administered 2024-01-07: 50 mg via INTRAVENOUS

## 2024-01-07 MED ORDER — PROPOFOL 10 MG/ML IV BOLUS
INTRAVENOUS | Status: AC
  Filled 2024-01-07: qty 20

## 2024-01-07 MED ORDER — CIPROFLOXACIN HCL 500 MG PO TABS: 500.0000 mg | ORAL_TABLET | Freq: Two times a day (BID) | ORAL | 0 refills | Status: AC

## 2024-01-07 MED ORDER — SODIUM CHLORIDE 0.9 % IV SOLN: INTRAVENOUS | Status: DC

## 2024-01-07 MED ORDER — CIPROFLOXACIN IN D5W 400 MG/200ML IV SOLN
INTRAVENOUS | Status: DC | PRN
Start: 2024-01-07 — End: 2024-01-07
  Administered 2024-01-07: 400 mg via INTRAVENOUS

## 2024-01-07 MED ORDER — LIDOCAINE 2% (20 MG/ML) 5 ML SYRINGE
INTRAMUSCULAR | Status: DC | PRN
  Administered 2024-01-07: 50 mg via INTRAVENOUS

## 2024-01-07 MED ORDER — PROPOFOL 1000 MG/100ML IV EMUL
INTRAVENOUS | Status: AC
  Filled 2024-01-07: qty 100

## 2024-01-07 MED ORDER — CIPROFLOXACIN IN D5W 400 MG/200ML IV SOLN
INTRAVENOUS | Status: AC
  Filled 2024-01-07: qty 200

## 2024-01-07 NOTE — Discharge Instructions (Signed)

## 2024-01-07 NOTE — H&P (Signed)
 GASTROENTEROLOGY PROCEDURE H&P NOTE   Primary Care Physician: Marvine Rush, MD  HPI: [name] is a 68 y.o. female who presents for EGD/EUS to evaluate a pancreatic cyst.    Past Medical History:  Diagnosis Date   Arthritis    Osteoarthritis   Colon polyps    GERD (gastroesophageal reflux disease)    Hiatal hernia    Hypertension    MS (multiple sclerosis) (HCC) 12/09/2013   Multiple sclerosis (HCC)    Seizures (HCC) 01/04/2005   1 and Only   Past Surgical History:  Procedure Laterality Date   COLONOSCOPY     COLONOSCOPY N/A 09/16/2012   Procedure: COLONOSCOPY;  Surgeon: Claudis RAYMOND Rivet, MD;  [facility];  Service: Endoscopy;  Laterality: N/A;  1030-rescheduled to 9:30 Ann notified pt   COLONOSCOPY N/A 01/05/2020   Procedure: COLONOSCOPY;  Surgeon: Rivet Claudis RAYMOND, MD;  [facility];  Service: Endoscopy;  Laterality: N/A;  730   COLONOSCOPY WITH PROPOFOL  N/A 12/23/2022   Procedure: COLONOSCOPY WITH PROPOFOL ;  Surgeon: Eartha Angelia Sieving, MD;  [facility];  Service: Gastroenterology;  Laterality: N/A;  8:30am;ASA 3   DILATION AND CURETTAGE OF UTERUS     ORIF HUMERUS FRACTURE Left 02/16/2017   Procedure: OPEN REDUCTION INTERNAL FIXATION (ORIF) LEFT PROXIMAL HUMERUS FRACTURE;  Surgeon: Addie Cordella Hamilton, MD;  [facility];  Service: Orthopedics;  Laterality: Left;   POLYPECTOMY  01/05/2020   Procedure: POLYPECTOMY;  Surgeon: Rivet Claudis RAYMOND, MD;  [facility];  Service: Endoscopy;;   POLYPECTOMY  12/23/2022   Procedure: POLYPECTOMY;  Surgeon: Eartha Angelia Sieving, MD;  [facility];  Service: Gastroenterology;;   Current Facility-Administered Medications  Medication Dose Route Frequency Provider Last Rate Last Admin   0.9 %  sodium chloride  infusion   Intravenous Continuous Mansouraty, Aloha Raddle., MD        Current Facility-Administered Medications:    0.9 %  sodium chloride  infusion, , Intravenous,  Continuous, Mansouraty, Aloha Raddle., MD Allergies  Allergen Reactions   Sulfa Antibiotics     Unsure    Codeine Nausea And Vomiting   Dilaudid  [Hydromorphone  Hcl] Nausea And Vomiting    Zofran  helped   Family History  Problem Relation Age of Onset   Stomach cancer Father    Sick sinus syndrome Father    Colon cancer Neg Hx    Social History   Socioeconomic History   Marital status: Single    Spouse name: Not on file   Number of children: 0   Years of education: College   Highest education level: Not on file  Occupational History    Employer: DISABLED  Tobacco Use   Smoking status: Every Day    Current packs/day: 0.75    Average packs/day: 0.8 packs/day for 30.0 years (22.5 ttl pk-yrs)    Types: Cigarettes    Passive exposure: Current   Smokeless tobacco: Never  Vaping Use   Vaping status: Never Used  Substance and Sexual Activity   Alcohol use: Yes    Alcohol/week: 8.0 standard drinks of alcohol    Types: 8 Cans of beer per week    Comment: 4-5 beers daily    Drug use: No   Sexual activity: Not on file  Other Topics Concern   Not on file  Social History Narrative   Patient is single and lives alone.   Patient has a college education   Patient is right-handed.   Patient is retired.  Patient drinks three cups of coffee daily.   Social Drivers of Corporate investment banker Strain: Low Risk  (02/25/2022)   Overall Financial Resource Strain (CARDIA)    Difficulty of Paying Living Expenses: Not hard at all  Food Insecurity: No Food Insecurity (05/19/2023)   Hunger Vital Sign    Worried About Running Out of Food in the Last Year: Never true    Ran Out of Food in the Last Year: Never true  Transportation Needs: No Transportation Needs (05/19/2023)   PRAPARE - Administrator, Civil Service (Medical): No    Lack of Transportation (Non-Medical): No  Physical Activity: Not on file  Stress: Not on file  Social Connections: Unknown (10/28/2021)   Received  from [facility]   Social Network    Social Network: Not on file  Intimate Partner Violence: Not At Risk (05/19/2023)   Humiliation, Afraid, Rape, and Kick questionnaire    Fear of Current or Ex-Partner: No    Emotionally Abused: No    Physically Abused: No    Sexually Abused: No    Physical Exam: There were no vitals filed for this visit. There is no height or weight on file to calculate BMI. GEN: NAD EYE: Sclerae anicteric ENT: MMM CV: Non-tachycardic GI: Soft, NT/ND NEURO:  Alert & Oriented x 3  Lab Results: No results for input(s): WBC, HGB, HCT, PLT in the last 72 hours. BMET No results for input(s): NA, K, CL, CO2, GLUCOSE, BUN, CREATININE, CALCIUM in the last 72 hours. LFT No results for input(s): PROT, ALBUMIN, AST, ALT, ALKPHOS, BILITOT, BILIDIR, IBILI in the last 72 hours. PT/INR No results for input(s): LABPROT, INR in the last 72 hours.   Impression / Plan: This is a 68 y.o.female who presents for EGD/EUS to evaluate a pancreatic cyst.    The risks of an EUS including intestinal perforation, bleeding, infection, aspiration, and medication effects were discussed as was the possibility it may not give a definitive diagnosis if a biopsy is performed.  When a biopsy of the pancreas is done as part of the EUS, there is an additional risk of pancreatitis at the rate of about 1-2%.  It was explained that procedure related pancreatitis is typically mild, although it can be severe and even life threatening, which is why we do not perform random pancreatic biopsies and only biopsy a lesion/area we feel is concerning enough to warrant the risk.   The risks and benefits of endoscopic evaluation/treatment were discussed with the patient and/or family; these include but are not limited to the risk of perforation, infection, bleeding, missed lesions, lack of diagnosis, severe illness requiring hospitalization, as well as anesthesia and  sedation related illnesses.  The patient's history has been reviewed, patient examined, no change in status, and deemed stable for procedure.  The patient and/or family is agreeable to proceed.    Aloha Finner, MD [facility] Advanced [facility]oscopy Office # [phone]

## 2024-01-07 NOTE — Anesthesia Preprocedure Evaluation (Addendum)
 Anesthesia Evaluation  Patient identified by MRN, date of birth, ID band Patient awake    Reviewed: Allergy & Precautions, H&P , NPO status , Patient's Chart, lab work & pertinent test results  Airway Mallampati: I  TM Distance: >3 FB Neck ROM: Full    Dental no notable dental hx. (+) Teeth Intact, Dental Advisory Given   Pulmonary Current Smoker   Pulmonary exam normal breath sounds clear to auscultation       Cardiovascular hypertension, Pt. on medications  Rhythm:Regular Rate:Normal     Neuro/Psych    Depression     Neuromuscular disease    GI/Hepatic Neg liver ROS, hiatal hernia,GERD  Medicated,,  Endo/Other  negative endocrine ROS    Renal/GU negative Renal ROS  negative genitourinary   Musculoskeletal  (+) Arthritis , Osteoarthritis,    Abdominal   Peds  Hematology negative hematology ROS (+)   Anesthesia Other Findings   Reproductive/Obstetrics negative OB ROS                              Anesthesia Physical Anesthesia Plan  ASA: 2  Anesthesia Plan: MAC   Post-op Pain Management: Minimal or no pain anticipated   Induction: Intravenous  PONV Risk Score and Plan: 1 and Propofol  infusion  Airway Management Planned: Natural Airway and Simple Face Mask  Additional Equipment:   Intra-op Plan:   Post-operative Plan:   Informed Consent: I have reviewed the patients History and Physical, chart, labs and discussed the procedure including the risks, benefits and alternatives for the proposed anesthesia with the patient or authorized representative who has indicated his/her understanding and acceptance.     Dental advisory given  Plan Discussed with: CRNA  Anesthesia Plan Comments:          Anesthesia Quick Evaluation

## 2024-01-07 NOTE — Transfer of Care (Signed)
 Immediate Anesthesia Transfer of Care Note  Patient: [name]  Procedure(s) Performed: ULTRASOUND, UPPER GI TRACT, ENDOSCOPIC EGD (ESOPHAGOGASTRODUODENOSCOPY) FINE NEEDLE ASPIRATION BIOPSY  Patient Location: PACU  Anesthesia Type:MAC  Level of Consciousness: awake, alert , and oriented  Airway & Oxygen Therapy: Patient Spontanous Breathing  Post-op Assessment: Report given to RN and Post -op Vital signs reviewed and stable  Post vital signs: Reviewed and stable  Last Vitals:  Vitals Value Taken Time  BP 130/64 01/07/24 16:17  Temp    Pulse 61 01/07/24 16:19  Resp 15 01/07/24 16:19  SpO2 100 % 01/07/24 16:19  Vitals shown include unfiled device data.  Last Pain:  Vitals:   01/07/24 1359  TempSrc: Temporal  PainSc: 0-No pain      Patients Stated Pain Goal: 0 (01/07/24 1359)  Complications: No notable events documented.

## 2024-01-07 NOTE — Op Note (Signed)
 [facility] Patient Name: [name] Procedure Date: 01/07/2024 MRN: [id number] Attending MD: Aloha Finner , MD, [id number] Date of Birth: 12-Dec-1955 CSN: [id number] Age: 68 Admit Type: Outpatient Procedure:                Upper EUS Indications:              Pancreatic cyst on CT scan Providers:                Aloha Finner, MD, Ozell Pouch, Lorrayne Kitty, Technician Referring MD:              Medicines:                Monitored Anesthesia Care, Cipro  400 mg IV Complications:            No immediate complications. Estimated Blood Loss:     Estimated blood loss was minimal. Procedure:                Pre-Anesthesia Assessment:                           - Prior to the procedure, a History and Physical                            was performed, and patient medications and                            allergies were reviewed. The patient's tolerance of                            previous anesthesia was also reviewed. The risks                            and benefits of the procedure and the sedation                            options and risks were discussed with the patient.                            All questions were answered, and informed consent                            was obtained. Prior Anticoagulants: The patient has                            taken Xarelto  (rivaroxaban ), last dose was 2 days                            prior to procedure. ASA Grade Assessment: III - A                            patient with severe systemic disease. After  reviewing the risks and benefits, the patient was                            deemed in satisfactory condition to undergo the                            procedure.                           After obtaining informed consent, the endoscope was                            passed under direct vision. Throughout the                            procedure, the patient's  blood pressure, pulse, and                            oxygen saturations were monitored continuously. The                            [id number]-H190 ([id number]) Olympus endoscope was introduced                            through the mouth, and advanced to the second part                            of duodenum. The [id number]-[id number] ([id number]) Olympus                            duodenoscope was introduced through the mouth, and                            advanced to the area of papilla. The [id number]                            ([id number]) Olympus linear ultrasound scope was                            introduced through the mouth, and advanced to the                            duodenum for ultrasound examination from the                            stomach and duodenum. The upper EUS was                            accomplished without difficulty. The patient                            tolerated the procedure. Scope In: Scope Out: Findings:      ENDOSCOPIC FINDING: :      No gross lesions were noted in the proximal esophagus and  in the mid       esophagus.      Islands of salmon-colored mucosa were present from 36 to 37 cm. No other       visible abnormalities were present. Biopsies were taken with a cold       forceps for histology.      The Z-line was irregular and was found 37 cm from the incisors.      A 4 cm hiatal hernia was present.      Patchy mildly erythematous mucosa without bleeding was found in the       entire examined stomach. Biopsies were taken with a cold forceps for       histology and Helicobacter pylori testing.      Patchy mildly erythematous mucosa without active bleeding and with no       stigmata of bleeding was found in the duodenal bulb, in the first       portion of the duodenum and in the second portion of the duodenum.       Biopsies were taken with a cold forceps for histology and Helicobacter       pylori testing.      The major papilla was normal.      ENDOSONOGRAPHIC  FINDING: :      Two cystic lesions were noted. One was anechoic and identified in the       genu of the pancreas the other was multicystic and identified in the       pancreatic tail. The cyst in the neck of pancreas measured 14 mm by 9       mm. The cyst in the tail of the pancreas measured 21 mm by 19 mm. There       was no associated mass or lesion or debris within these cystic lesions.       Both of the cysts showed evidence of pancreatic duct communication. The       cyst in the neck of the pancreas would have required significant       placement of needle into the neck from the third station and was not       felt that adequate fluid would be able to be achieved for sampling       purposes. However the cystic lesion in the tail was felt to be more       appropriate and diagnostic needle aspiration for fluid was performed.       Color Doppler imaging was utilized prior to needle puncture to confirm a       lack of significant vascular structures within the needle path. One pass       was made with the 22 gauge expect needle using a transgastric approach.       A stylet was used. The amount of fluid collected was a little over 1.5       mL. The fluid was clear, white and slightly viscous. Sample(s) were sent       for glucose, amylase concentration and CEA.      Pancreatic parenchymal abnormalities were noted in the pancreatic head       (PD = 2.4 mm), genu of the pancreas (1.7 mm), pancreatic body (1.3 mm)       and pancreatic tail (0.7 mm). These consisted of hyperechoic strands.      There was no sign of significant endosonographic abnormality in the       common bile duct (4.3 mm) and  in the common hepatic duct (4.6 mm). An       unremarkable gallbladder and ducts with regular contour were identified.      Endosonographic imaging of the ampulla showed no intramural       (subepithelial) lesion.      Endosonographic imaging in the visualized portion of the liver showed no        mass.      No malignant-appearing lymph nodes were visualized in the celiac region       (level 20), peripancreatic region and porta hepatis region.      The celiac region was visualized. Impression:               EGD Impression:                           - No gross lesions in the proximal esophagus and in                            the mid esophagus.                           - Salmon-colored mucosal islands suspicious for                            Barrett's esophagus noted distally. Biopsied.                           - Z-line irregular, 37 cm from the incisors.                           - 4 cm hiatal hernia.                           - Erythematous mucosa in the stomach. Biopsied.                           - Erythematous duodenopathy. Biopsied.                           - Normal major papilla.                           EUS impression:                           - Two cystic lesions were seen in the genu of the                            pancreas (unilocular) and pancreatic tail                            (multicystic). Cytology results are pending.                            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 Fine needle                            aspiration for fluid performed from the tail of                            pancreas cyst.                           - Pancreatic parenchymal abnormalities consisting                            of hyperechoic strands were noted in the pancreatic                            head, genu of the pancreas, pancreatic body and                            pancreatic tail.                           - There was no sign of significant pathology in the                            common bile duct and in the common hepatic duct.                            Unremarkable gallbladder noted.                           - No  malignant-appearing lymph nodes were                            visualized in the celiac region (level 20),                            peripancreatic region and porta hepatis region. Moderate Sedation:      Not Applicable - Patient had care per Anesthesia. Recommendation:           - The patient will be observed post-procedure,                            until all discharge criteria are met.                           - Discharge patient to home.                           - Patient has a contact number available for                            emergencies. The signs and symptoms of potential  delayed complications were discussed with the                            patient. Return to normal activities tomorrow.                            Written discharge instructions were provided to the                            patient.                           - Low fat diet.                           - Observe patient's clinical course.                           - Ciprofloxacin  500 mg twice daily for 3 days to                            decrease risk of post interventional infection.                           - May restart Xarelto  on 7/19 to decrease risk of                            post interventional bleeding.                           - Continue present medications otherwise.                           - Await cytology results and await path results.                           - Follow-up to be dictated based on results of                            pancreatic fluid analysis. Likely MRI/MRCP in 6 to                            12 months.                           - The findings and recommendations were discussed                            with the patient.                           - The findings and recommendations were discussed                            with the patient's family. Procedure Code(s):        --- Professional ---  56761,  Esophagogastroduodenoscopy, flexible,                            transoral; with transendoscopic ultrasound-guided                            intramural or transmural fine needle                            aspiration/biopsy(s), (includes endoscopic                            ultrasound examination limited to the esophagus,                            stomach or duodenum, and adjacent structures)                           43239, 59, Esophagogastroduodenoscopy, flexible,                            transoral; with biopsy, single or multiple Diagnosis Code(s):        --- Professional ---                           K22.89, Other specified disease of esophagus                           K44.9, Diaphragmatic hernia without obstruction or                            gangrene                           K31.89, Other diseases of stomach and duodenum                           K86.2, Cyst of pancreas                           K86.9, Disease of pancreas, unspecified                           I89.9, Noninfective disorder of lymphatic vessels                            and lymph nodes, unspecified CPT copyright 2022 [employer]. All rights reserved. The codes documented in this report are preliminary and upon coder review may  be revised to meet current compliance requirements. Aloha Finner, MD 01/07/2024 4:37:22 PM Number of Addenda: 0

## 2024-01-08 ENCOUNTER — Encounter (HOSPITAL_COMMUNITY): Payer: Self-pay | Admitting: Gastroenterology

## 2024-01-11 LAB — SURGICAL PATHOLOGY

## 2024-01-13 ENCOUNTER — Telehealth: Payer: Self-pay | Admitting: Gastroenterology

## 2024-01-13 NOTE — Telephone Encounter (Signed)
 [facility] pancreatic fluid analysis  Amylase 110,660 units/L CEA 1853 ng/mL Glucose does not look like it could be completed as result of the small amount of fluid  Overall the fluid analysis from the tail of pancreas cyst is most consistent with an IPMN.  Thankfully no main duct dilation. With the patient having another cyst in the neck of pancreas that was too deep for FNA sampling and size, I do think we need to monitor these areas closely.  The plan will be for a 69-month MRI/MRCP.  If all things are stable (remembering that there were 2 cysts that were noted on EUS) then we would likely consider a 1 year follow-up MRI/MRCP.  I will forward this to the patient's primary gastroenterologist so that they are aware and can work on enacting the follow-up.  I will be available in future if something else develops or changes.  A separate results note with the patient's mucosal biopsy findings will be sent when completed.   Aloha Finner, MD [facility] Office # [phone]

## 2024-01-14 ENCOUNTER — Ambulatory Visit: Payer: Self-pay | Admitting: Gastroenterology

## 2024-01-14 NOTE — Telephone Encounter (Signed)
 Thanks a lot [employer].  Hi Ann, can you put a reminder for repeat MRI/MRCP in 6 months?  Diagnosis: Pancreatic cyst, IPMN  Thanks,   Toribio Fortune, MD Gastroenterology and Hepatology [facility]

## 2024-01-15 NOTE — Telephone Encounter (Signed)
6 mth MRI/MRCP noted in recall

## 2024-01-19 NOTE — Telephone Encounter (Signed)
 Look under the scanned lab report under labs

## 2024-01-19 NOTE — Telephone Encounter (Signed)
 Dr Wilhelmenia have you seen the amylase lipase etc labs under lab tab?

## 2024-01-19 NOTE — Telephone Encounter (Signed)
 We have not received them yet. GM

## 2024-01-19 NOTE — Telephone Encounter (Signed)
 [name], Sorry, I went back to my telephone note and this was discussed with her previously. The results of the CEA and Amylase suggest this is a Mucinous type of cyst known as an IPMN. It needs to be surveilled, which is the plan\ with 73-month MRI/MRCP. It has been discussed with Dr. Eartha, who plans followup imaging. Based on followup imaging that will present the longer term needs of surveillance or if other followup or management will be required depending on what happens with the cysts in the longer term. No additional changes in the plan of action. Thanks. GM

## 2024-01-25 NOTE — Anesthesia Postprocedure Evaluation (Signed)
 Anesthesia Post Note  Patient: [name]  Procedure(s) Performed: ULTRASOUND, UPPER GI TRACT, ENDOSCOPIC EGD (ESOPHAGOGASTRODUODENOSCOPY) FINE NEEDLE ASPIRATION BIOPSY     Patient location during evaluation: Endoscopy Anesthesia Type: MAC Level of consciousness: awake and alert Pain management: pain level controlled Vital Signs Assessment: post-procedure vital signs reviewed and stable Respiratory status: spontaneous breathing, nonlabored ventilation and respiratory function stable Cardiovascular status: stable and blood pressure returned to baseline Postop Assessment: no apparent nausea or vomiting Anesthetic complications: no   No notable events documented.  Last Vitals:  Vitals:   01/07/24 1630 01/07/24 1640  BP: (!) 159/81 (!) 141/82  Pulse: (!) 58 67  Resp: 11 16  Temp:    SpO2: 99% 98%    Last Pain:  Vitals:   01/07/24 1640  TempSrc:   PainSc: 0-No pain                 Marianela Mandrell,W. EDMOND

## 2024-03-02 DIAGNOSIS — F32A Depression, unspecified: Secondary | ICD-10-CM | POA: Diagnosis not present

## 2024-03-02 DIAGNOSIS — K869 Disease of pancreas, unspecified: Secondary | ICD-10-CM | POA: Diagnosis not present

## 2024-03-02 DIAGNOSIS — F411 Generalized anxiety disorder: Secondary | ICD-10-CM | POA: Diagnosis not present

## 2024-03-02 DIAGNOSIS — I10 Essential (primary) hypertension: Secondary | ICD-10-CM | POA: Diagnosis not present

## 2024-03-02 DIAGNOSIS — G35 Multiple sclerosis: Secondary | ICD-10-CM | POA: Diagnosis not present

## 2024-03-02 DIAGNOSIS — Z79899 Other long term (current) drug therapy: Secondary | ICD-10-CM | POA: Diagnosis not present

## 2024-03-02 DIAGNOSIS — I82409 Acute embolism and thrombosis of unspecified deep veins of unspecified lower extremity: Secondary | ICD-10-CM | POA: Diagnosis not present

## 2024-03-02 DIAGNOSIS — Z7689 Persons encountering health services in other specified circumstances: Secondary | ICD-10-CM | POA: Diagnosis not present

## 2024-03-02 DIAGNOSIS — E871 Hypo-osmolality and hyponatremia: Secondary | ICD-10-CM | POA: Diagnosis not present

## 2024-03-21 DIAGNOSIS — J302 Other seasonal allergic rhinitis: Secondary | ICD-10-CM | POA: Diagnosis not present

## 2024-03-21 DIAGNOSIS — G43909 Migraine, unspecified, not intractable, without status migrainosus: Secondary | ICD-10-CM | POA: Diagnosis not present

## 2024-03-21 DIAGNOSIS — G43009 Migraine without aura, not intractable, without status migrainosus: Secondary | ICD-10-CM | POA: Diagnosis not present

## 2024-04-06 ENCOUNTER — Encounter (INDEPENDENT_AMBULATORY_CARE_PROVIDER_SITE_OTHER): Payer: Self-pay | Admitting: Gastroenterology

## 2024-05-02 ENCOUNTER — Encounter: Payer: Self-pay | Admitting: Family Medicine

## 2024-06-09 ENCOUNTER — Encounter (INDEPENDENT_AMBULATORY_CARE_PROVIDER_SITE_OTHER): Payer: Self-pay | Admitting: *Deleted

## 2024-06-21 ENCOUNTER — Telehealth (INDEPENDENT_AMBULATORY_CARE_PROVIDER_SITE_OTHER): Payer: Self-pay

## 2024-06-21 ENCOUNTER — Other Ambulatory Visit (INDEPENDENT_AMBULATORY_CARE_PROVIDER_SITE_OTHER): Payer: Self-pay

## 2024-06-21 DIAGNOSIS — K869 Disease of pancreas, unspecified: Secondary | ICD-10-CM

## 2024-06-21 NOTE — Telephone Encounter (Signed)
 Patient left vm wanti to schedule an MRI (on recall). Is it okay if I order this under your name? Diagnosis is Pancreatic cyst, IPMN on recall order. Please advise Dr. Cinderella

## 2024-06-21 NOTE — Telephone Encounter (Signed)
 PA for MRI MRCP on [employer]: Approved Authorization #[id number]  Tracking [id number]

## 2024-06-21 NOTE — Telephone Encounter (Signed)
 Yes please MRI Abdomen MRCP pancreatic protocol

## 2024-06-21 NOTE — Telephone Encounter (Signed)
 Spoke with patient, scheduled MRI for 07/14/2024 at 9:30am. Patient aware to be NPO for 4 hours prior and to arrive 30 minutes early to test. Patient verbalized understanding.

## 2024-07-07 NOTE — Telephone Encounter (Signed)
 PA on [employer] for MRI MRCP on [employer]: Approved Authorization #[id number]  Tracking #[id number]

## 2024-07-14 ENCOUNTER — Ambulatory Visit (HOSPITAL_COMMUNITY)

## 2024-08-11 ENCOUNTER — Ambulatory Visit (HOSPITAL_COMMUNITY)

## 2024-10-13 ENCOUNTER — Ambulatory Visit: Admitting: Neurology
